# Patient Record
Sex: Female | Born: 1935 | Race: White | Hispanic: No | Smoking: Former smoker
Health system: Southern US, Community
[De-identification: ages and names within clinical notes are randomized; demographics above are authoritative.]

## PROBLEM LIST (undated history)

## (undated) DIAGNOSIS — I10 Essential (primary) hypertension: Secondary | ICD-10-CM

## (undated) DIAGNOSIS — H409 Unspecified glaucoma: Secondary | ICD-10-CM

## (undated) DIAGNOSIS — C539 Malignant neoplasm of cervix uteri, unspecified: Secondary | ICD-10-CM

## (undated) DIAGNOSIS — I2510 Atherosclerotic heart disease of native coronary artery without angina pectoris: Secondary | ICD-10-CM

## (undated) DIAGNOSIS — I779 Disorder of arteries and arterioles, unspecified: Secondary | ICD-10-CM

## (undated) DIAGNOSIS — I6529 Occlusion and stenosis of unspecified carotid artery: Secondary | ICD-10-CM

## (undated) DIAGNOSIS — G20A1 Parkinson's disease without dyskinesia, without mention of fluctuations: Secondary | ICD-10-CM

## (undated) DIAGNOSIS — C679 Malignant neoplasm of bladder, unspecified: Secondary | ICD-10-CM

## (undated) DIAGNOSIS — I739 Peripheral vascular disease, unspecified: Secondary | ICD-10-CM

## (undated) DIAGNOSIS — E785 Hyperlipidemia, unspecified: Secondary | ICD-10-CM

## (undated) DIAGNOSIS — G20 Parkinson's disease: Secondary | ICD-10-CM

## (undated) HISTORY — PX: CHOLECYSTECTOMY: SHX55

## (undated) HISTORY — DX: Disorder of arteries and arterioles, unspecified: I77.9

## (undated) HISTORY — DX: Essential (primary) hypertension: I10

## (undated) HISTORY — DX: Peripheral vascular disease, unspecified: I73.9

## (undated) HISTORY — DX: Hyperlipidemia, unspecified: E78.5

## (undated) HISTORY — PX: TRANSTHORACIC ECHOCARDIOGRAM: SHX275

## (undated) HISTORY — PX: ABDOMINAL HYSTERECTOMY: SHX81

## (undated) HISTORY — PX: BLADDER TUMOR EXCISION: SHX238

## (undated) HISTORY — DX: Occlusion and stenosis of unspecified carotid artery: I65.29

---

## 1997-08-15 ENCOUNTER — Other Ambulatory Visit: Admission: RE | Admit: 1997-08-15 | Discharge: 1997-08-15 | Payer: Self-pay | Admitting: Urology

## 1997-08-27 ENCOUNTER — Ambulatory Visit (HOSPITAL_COMMUNITY): Admission: RE | Admit: 1997-08-27 | Discharge: 1997-08-28 | Payer: Self-pay | Admitting: Urology

## 1997-09-30 ENCOUNTER — Ambulatory Visit (HOSPITAL_COMMUNITY): Admission: RE | Admit: 1997-09-30 | Discharge: 1997-09-30 | Payer: Self-pay | Admitting: Cardiology

## 1997-11-26 ENCOUNTER — Ambulatory Visit (HOSPITAL_COMMUNITY): Admission: RE | Admit: 1997-11-26 | Discharge: 1997-11-26 | Payer: Self-pay | Admitting: Gastroenterology

## 1997-12-19 ENCOUNTER — Other Ambulatory Visit: Admission: RE | Admit: 1997-12-19 | Discharge: 1997-12-19 | Payer: Self-pay | Admitting: *Deleted

## 1999-02-11 ENCOUNTER — Other Ambulatory Visit: Admission: RE | Admit: 1999-02-11 | Discharge: 1999-02-11 | Payer: Self-pay | Admitting: *Deleted

## 1999-02-17 ENCOUNTER — Encounter: Payer: Self-pay | Admitting: *Deleted

## 1999-02-17 ENCOUNTER — Encounter: Admission: RE | Admit: 1999-02-17 | Discharge: 1999-02-17 | Payer: Self-pay | Admitting: *Deleted

## 1999-04-08 ENCOUNTER — Encounter: Admission: RE | Admit: 1999-04-08 | Discharge: 1999-04-08 | Payer: Self-pay | Admitting: *Deleted

## 1999-04-08 ENCOUNTER — Encounter: Payer: Self-pay | Admitting: *Deleted

## 2000-03-04 ENCOUNTER — Encounter: Payer: Self-pay | Admitting: *Deleted

## 2000-03-04 ENCOUNTER — Encounter: Admission: RE | Admit: 2000-03-04 | Discharge: 2000-03-04 | Payer: Self-pay | Admitting: *Deleted

## 2000-03-09 ENCOUNTER — Other Ambulatory Visit: Admission: RE | Admit: 2000-03-09 | Discharge: 2000-03-09 | Payer: Self-pay | Admitting: *Deleted

## 2001-03-23 ENCOUNTER — Encounter: Admission: RE | Admit: 2001-03-23 | Discharge: 2001-03-23 | Payer: Self-pay | Admitting: *Deleted

## 2001-03-23 ENCOUNTER — Encounter: Payer: Self-pay | Admitting: *Deleted

## 2001-04-06 ENCOUNTER — Other Ambulatory Visit: Admission: RE | Admit: 2001-04-06 | Discharge: 2001-04-06 | Payer: Self-pay | Admitting: *Deleted

## 2002-04-04 ENCOUNTER — Encounter: Admission: RE | Admit: 2002-04-04 | Discharge: 2002-04-04 | Payer: Self-pay | Admitting: *Deleted

## 2002-04-04 ENCOUNTER — Encounter: Payer: Self-pay | Admitting: *Deleted

## 2002-06-14 ENCOUNTER — Encounter: Payer: Self-pay | Admitting: Orthopedic Surgery

## 2002-06-14 ENCOUNTER — Ambulatory Visit (HOSPITAL_COMMUNITY): Admission: RE | Admit: 2002-06-14 | Discharge: 2002-06-14 | Payer: Self-pay | Admitting: Orthopedic Surgery

## 2002-08-15 ENCOUNTER — Encounter: Admission: RE | Admit: 2002-08-15 | Discharge: 2002-08-15 | Payer: Self-pay | Admitting: Orthopedic Surgery

## 2002-08-15 ENCOUNTER — Encounter: Payer: Self-pay | Admitting: Orthopedic Surgery

## 2002-08-16 ENCOUNTER — Ambulatory Visit (HOSPITAL_BASED_OUTPATIENT_CLINIC_OR_DEPARTMENT_OTHER): Admission: RE | Admit: 2002-08-16 | Discharge: 2002-08-16 | Payer: Self-pay | Admitting: Orthopedic Surgery

## 2003-05-08 ENCOUNTER — Encounter: Admission: RE | Admit: 2003-05-08 | Discharge: 2003-05-08 | Payer: Self-pay | Admitting: Obstetrics and Gynecology

## 2003-05-23 ENCOUNTER — Other Ambulatory Visit: Admission: RE | Admit: 2003-05-23 | Discharge: 2003-05-23 | Payer: Self-pay | Admitting: Obstetrics and Gynecology

## 2003-06-11 ENCOUNTER — Ambulatory Visit (HOSPITAL_COMMUNITY): Admission: RE | Admit: 2003-06-11 | Discharge: 2003-06-11 | Payer: Self-pay | Admitting: Gastroenterology

## 2003-06-20 ENCOUNTER — Encounter: Admission: RE | Admit: 2003-06-20 | Discharge: 2003-06-20 | Payer: Self-pay | Admitting: Obstetrics and Gynecology

## 2004-06-10 ENCOUNTER — Encounter: Admission: RE | Admit: 2004-06-10 | Discharge: 2004-06-10 | Payer: Self-pay | Admitting: Obstetrics and Gynecology

## 2005-06-14 ENCOUNTER — Encounter: Admission: RE | Admit: 2005-06-14 | Discharge: 2005-06-14 | Payer: Self-pay | Admitting: Obstetrics and Gynecology

## 2005-10-06 HISTORY — PX: NM MYOVIEW LTD: HXRAD82

## 2006-05-18 ENCOUNTER — Encounter: Admission: RE | Admit: 2006-05-18 | Discharge: 2006-05-18 | Payer: Self-pay | Admitting: Internal Medicine

## 2006-07-12 ENCOUNTER — Encounter: Admission: RE | Admit: 2006-07-12 | Discharge: 2006-07-12 | Payer: Self-pay | Admitting: Obstetrics and Gynecology

## 2007-03-08 ENCOUNTER — Encounter: Admission: RE | Admit: 2007-03-08 | Discharge: 2007-03-08 | Payer: Self-pay | Admitting: Orthopaedic Surgery

## 2007-07-26 ENCOUNTER — Encounter: Admission: RE | Admit: 2007-07-26 | Discharge: 2007-07-26 | Payer: Self-pay | Admitting: Internal Medicine

## 2007-08-07 ENCOUNTER — Encounter: Admission: RE | Admit: 2007-08-07 | Discharge: 2007-08-07 | Payer: Self-pay | Admitting: Internal Medicine

## 2008-01-01 ENCOUNTER — Ambulatory Visit (HOSPITAL_COMMUNITY): Admission: RE | Admit: 2008-01-01 | Discharge: 2008-01-03 | Payer: Self-pay | Admitting: Orthopedic Surgery

## 2008-02-16 ENCOUNTER — Encounter: Admission: RE | Admit: 2008-02-16 | Discharge: 2008-02-16 | Payer: Self-pay | Admitting: Internal Medicine

## 2008-11-12 ENCOUNTER — Encounter: Admission: RE | Admit: 2008-11-12 | Discharge: 2008-11-12 | Payer: Self-pay | Admitting: Internal Medicine

## 2009-02-10 DIAGNOSIS — L659 Nonscarring hair loss, unspecified: Secondary | ICD-10-CM | POA: Insufficient documentation

## 2009-02-27 DIAGNOSIS — D649 Anemia, unspecified: Secondary | ICD-10-CM | POA: Insufficient documentation

## 2009-09-05 HISTORY — PX: OTHER SURGICAL HISTORY: SHX169

## 2009-09-30 DIAGNOSIS — M899 Disorder of bone, unspecified: Secondary | ICD-10-CM | POA: Insufficient documentation

## 2009-11-17 ENCOUNTER — Inpatient Hospital Stay (HOSPITAL_COMMUNITY): Admission: AD | Admit: 2009-11-17 | Discharge: 2009-11-18 | Payer: Self-pay | Admitting: Internal Medicine

## 2009-11-17 DIAGNOSIS — R300 Dysuria: Secondary | ICD-10-CM | POA: Insufficient documentation

## 2010-01-22 DIAGNOSIS — J00 Acute nasopharyngitis [common cold]: Secondary | ICD-10-CM | POA: Insufficient documentation

## 2010-02-05 ENCOUNTER — Encounter: Admission: RE | Admit: 2010-02-05 | Discharge: 2010-02-05 | Payer: Self-pay | Admitting: Internal Medicine

## 2010-03-29 ENCOUNTER — Encounter (HOSPITAL_BASED_OUTPATIENT_CLINIC_OR_DEPARTMENT_OTHER): Payer: Self-pay | Admitting: Internal Medicine

## 2010-03-29 ENCOUNTER — Encounter: Payer: Self-pay | Admitting: Vascular Surgery

## 2010-05-21 LAB — CBC
HCT: 33.7 % — ABNORMAL LOW (ref 36.0–46.0)
HCT: 36.5 % (ref 36.0–46.0)
Hemoglobin: 11.3 g/dL — ABNORMAL LOW (ref 12.0–15.0)
Hemoglobin: 12.4 g/dL (ref 12.0–15.0)
MCH: 28.1 pg (ref 26.0–34.0)
MCH: 28.5 pg (ref 26.0–34.0)
MCHC: 34.1 g/dL (ref 30.0–36.0)
MCV: 84.2 fL (ref 78.0–100.0)
Platelets: 299 10*3/uL (ref 150–400)
RBC: 4.01 MIL/uL (ref 3.87–5.11)
RDW: 15.2 % (ref 11.5–15.5)
WBC: 11.6 10*3/uL — ABNORMAL HIGH (ref 4.0–10.5)

## 2010-05-21 LAB — URINALYSIS, ROUTINE W REFLEX MICROSCOPIC
Bilirubin Urine: NEGATIVE
Glucose, UA: NEGATIVE mg/dL
Ketones, ur: NEGATIVE mg/dL
Leukocytes, UA: NEGATIVE
Nitrite: NEGATIVE
Specific Gravity, Urine: 1.013 (ref 1.005–1.030)
pH: 6 (ref 5.0–8.0)

## 2010-05-21 LAB — COMPREHENSIVE METABOLIC PANEL
BUN: 8 mg/dL (ref 6–23)
CO2: 27 mEq/L (ref 19–32)
Calcium: 9.6 mg/dL (ref 8.4–10.5)
Creatinine, Ser: 0.73 mg/dL (ref 0.4–1.2)
GFR calc non Af Amer: >60 mL/min (ref >60)
Glucose, Bld: 155 mg/dL — ABNORMAL HIGH (ref 70–99)
Sodium: 135 mEq/L (ref 135–145)
Total Protein: 7.1 g/dL (ref 6.0–8.3)

## 2010-05-21 LAB — BASIC METABOLIC PANEL
CO2: 26 mEq/L (ref 19–32)
Chloride: 105 mEq/L (ref 96–112)
Creatinine, Ser: 0.8 mg/dL (ref 0.4–1.2)
GFR calc Af Amer: >60 mL/min (ref >60)
Potassium: 3.3 mEq/L — ABNORMAL LOW (ref 3.5–5.1)
Sodium: 137 mEq/L (ref 135–145)

## 2010-05-21 LAB — URINE CULTURE
Culture  Setup Time: 201109122104
Special Requests: NEGATIVE

## 2010-07-21 NOTE — Op Note (Signed)
NAMESOMYA, Stacey Walker                 ACCOUNT NO.:  [id number]   MEDICAL RECORD NO.:  [id number]          PATIENT TYPE:  AMB   LOCATION:  DAY                          FACILITY:  [facility]   PHYSICIAN:  Ollen Gross, M.D.    DATE OF BIRTH:  11-Dec-1925   DATE OF PROCEDURE:  01/01/2008  DATE OF DISCHARGE:                               OPERATIVE REPORT   PREOPERATIVE DIAGNOSIS:  Intractable right hip bursitis with gluteal  tendon tear.   POSTOPERATIVE DIAGNOSIS:  Intractable right hip bursitis with gluteal  tendon tear.   PROCEDURE:  Right hip bursectomy, iliotibial band resection and gluteal  tendon repair.   SURGEON:  Ollen Gross, M.D.   ASSISTANT:  Avel Peace, P.A.-C.   ANESTHESIA:  General.   ESTIMATED BLOOD LOSS:  Minimal.   DRAINS:  None.   COMPLICATIONS:  None.   CONDITION:  Stable to recovery.   BRIEF CLINICAL NOTE:  Stacey Walker is an 77 year old female who has had an  over 1 year history of significant lateral hip pain on the right with  progressive dysfunction.  She has had injections on greater than  occasion which have provided very short term partial relief.  She had  physical therapy without much benefit.  Unfortunately, the pain is  become intractable and she presents now the above-mentioned procedure.   PROCEDURE IN DETAIL:  After successful administration of general  anesthetic, the patient is placed in left lateral decubitus position  with the right side up and held with the hip positioner.  Right lower  extremity was isolated from the perineum with plastic drapes and prepped  and draped in usual sterile fashion.  Short lateral incision was made  directly over the tip of the greater trochanter coursing from proximal  to distal about 4 inches altogether.  The skin was cut through the  subcutaneous tissue to the level of fascia lata which was incised in  line with the skin incision.  There was a very large thickened bursa  present and rotated the hip  internally and externally to get the full  extent of the bursa removed.  Inspected the gluteal tendons.  There was  quite a bit of attrition of the gluteus medius, and it is paper thin at  its insertion on the tip of the trochanter consistent with a partial  tear.  In fact, I dissected through this paper thin layer, and there was  a big defect present on the tip of the trochanter.  I roughened up the  edge of bone and placed 2 Mitek anchors into the greater trochanter.  We  then passed the needles through the gluteal tendon and advanced it stick  down onto the bone and tied these and oversewed to get a good stable  repair.  Once this was completed, I inspected the rest of the tendon,  and it was fine.  I examined the short external rotators, and her  piriformis was extremely tight.  I did release the piriformis tendon as  it potentially was playing a roll in her posterolateral pain.  I then  thoroughly irrigated  the wound with saline.  I checked again, and no  other bleeding points were noted.  I removed a triangular piece of the  fascia lata so it would not rub over the tip of the trochanter.  We  closed the rest of the fascia lata above and below the triangle with  interrupted #1 Vicryl.  Subcutaneous tissue was closed with #1-0 and #2-  0 Vicryl and subcuticular running 4-0 Monocryl.  Thirty mL of 0.25%  Marcaine with epinephrine injected into the subcutaneous tissues.  Steri-  Strips and a bulky sterile dressing was then applied, and she was  awakened and transferred to recovery in stable condition.      Ollen Gross, M.D.  Electronically Signed     FA/MEDQ  D:  01/01/2008  T:  01/01/2008  Job:  [id number]

## 2010-07-24 NOTE — Op Note (Signed)
NAME:  Stacey Walker, Stacey Walker                           ACCOUNT NO.:  [id number]   MEDICAL RECORD NO.:  [id number]                   PATIENT TYPE:  AMB   LOCATION:  ENDO                                 FACILITY:  [facility]   PHYSICIAN:  Petra Kuba, M.D.                 DATE OF BIRTH:  12-Mar-1925   DATE OF PROCEDURE:  06/11/2003  DATE OF DISCHARGE:                                 OPERATIVE REPORT   PROCEDURE:  Colonoscopy.   INDICATIONS:  The patient is history of colon polyps.  Due for repeat  screening.  Consent was signed after risks, benefits, methods, and options  were thoroughly discussed in the office multiple times in the past.   PREMEDICATIONS:  Demerol 80 mg, Versed 7.5 mg.   DESCRIPTION OF PROCEDURE:  Rectal inspection pertinent for external  hemorrhoids, small.  Digital exam was negative.  Video pediatric adjustable  colonoscope was inserted and with abdominal pressure able to be advanced  around the colon to the cecum.  This did not require any position changes.  No obvious abnormality was seen on insertion.  The scope was slowly  withdrawn.  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  Photo documentation was obtained  although the picture did not come out very well since it was too dark.  After lots of washing and suctioning, adequate visualization was obtained  and on slow withdrawal through the colon, no additional findings were seen;  specifically, no polyps, tumors, masses, or diverticula.  Anorectal pull-  through and retroflexion confirmed some small hemorrhoids.  The scope was  reinserted a short ways up the left side of the colon.  Air was suctioned  and the scope removed.  The patient tolerated the procedure well.  There was  no obvious immediate complication.   ENDOSCOPIC DIAGNOSES:  1. Internal and external hemorrhoids.  2. Cecal arteriovenous malformations, full.  3. Otherwise within normal limits to the cecum.   PLAN:  Yearly rectals and guaiacs per Dr. Eloise Harman.  Happy to see  back  p.r.n.  Consider repeat screening in 5-10 years if doing well medically.                                               Petra Kuba, M.D.    MEM/MEDQ  D:  06/11/2003  T:  06/11/2003  Job:  [id number]   cc:   Barry Dienes. Eloise Harman, M.D.  [address]  [place]  [place] [address]  Fax: [phone]   Madaline Savage, M.D.  [phone] [address], [address]  [place]  [place] [address]  Fax: [phone]

## 2010-07-24 NOTE — Op Note (Signed)
NAME:  Stacey Walker, Stacey Walker                           ACCOUNT NO.:  [id number]   MEDICAL RECORD NO.:  [id number]                   PATIENT TYPE:  AMB   LOCATION:  DSC                                  FACILITY:  [facility]   PHYSICIAN:  Katy Fitch. Naaman Plummer., M.D.          DATE OF BIRTH:  [birth year]   DATE OF PROCEDURE:  08/16/2002  DATE OF DISCHARGE:                                 OPERATIVE REPORT   PREOPERATIVE DIAGNOSES:  1. 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  2. Rule out possible full-thickness anterior supraspinatus rotator cuff     tear.   POSTOPERATIVE DIAGNOSES:  1. 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  2. Rule out possible full-thickness anterior supraspinatus rotator cuff     tear.  3. Identification of significant bursal hypertrophy and partial-thickness     bursal side rotator cuff degenerative tear due to chronic impingement.     No sign of full-thickness rotated rotator cuff tear.   PROCEDURES:  1. Diagnostic arthroscopy, left glenohumeral joint.  2. Arthroscopic subacromial decompression with bursectomy, coracoacromial     ligament release, and acromioplasty.  3. Arthroscopic distal clavicle resection.   SURGEON:  Katy Fitch. Sypher, M.D.   ASSISTANT:  Jonni Sanger, P.A.   ANESTHESIA:  General endotracheal supplemented by interscalene block  supervised by the anesthesiologist, Stacey Walker. Stacey Walker, M.D.   INDICATIONS:  Stacey Walker is a 77 year old woman who has had a history of  previous right-side subacromial decompression and rotator cuff repair.  She  did quite well after surgery in 1995 on the right.  She has progressed to an  impingement syndrome on the left and is having difficulty with elevation of  her shoulder, external rotation, weakness of abduction, and night-time  symptoms.  Clinical examination and plain films suggested AC arthropathy and  an anterior impingement syndrome evidenced by type 3 acromion and some  sclerotic changes of the greater tuberosity.   An MRI was obtained, which demonstrated moderate tendinopathy of the  supraspinatus tendon and a partial anterior full-thickness tear as well as  bursal hypertrophy.   We recommended diagnostic arthroscopy followed by appropriate intervention,  anticipating subacromial decompression, distal clavicle resection, and  possible repair of the rotator cuff.   PROCEDURE:  Stacey Walker was brought to the operating room and placed in  supine position on the operating table.  Following the induction of general  endotracheal anesthesia, she was carefully positioned in the beach chair  position in the aid of a torso and head holder designed for shoulder  arthroscopy.   The entire right upper extremity and forequarter were prepped with Duraprep  and draped with impervious arthroscopy drapes.   The scope was introduced through a standard posterior portal with blunt  technique.  A  diagnostic arthroscopy revealed intact hyaline articular  cartilage surfaces on the humeral head and glenoid.  The labrum was intact  360 degrees.  The biceps anchor was stable.  The rotator interval was normal  in appearance.  The subscapularis was normal.  The anterior glenohumeral  ligaments were normal.  The inferior recess was normal.  The deep surface of  the supraspinatus, infraspinatus, and teres minor were easily visualized,  found to be intact.  There was a considerable amount of fibrovascular tissue  consistent with early adhesive capsulitis noted in the superior recess and  the deep surface of the infraspinatus and teres minor.   The scope was removed from the glenohumeral joint and placed in the  subacromial space.  There was noted to be a moderate degree of bursitis.  The anterior acromial spur morphology was  easily identified after release of  the coracoacromial ligament.  The AC capsule was quite prominent.  The  capsule was taken down with the bipolar cautery, followed by use of the  suction shaver to remove deep surface bursa on the acromion.  The acromion  was leveled to a type 1 morphology with the bur brought in both posteriorly  and laterally with contralateral visualization.   The [facility] joint was inspected and found to be moderately degenerative.  The  distal 18 mm of clavicle was removed off the scapula without difficulty.   Hemostasis was achieved with bipolar cautery.   After a thorough bursectomy, inspection of the rotator cuff revealed a  partial-thickness tear at the anterior supraspinatus adjacent to the greater  tuberosity about 5 mm posterior to the rotator interval.  This was not full-  thickness and not retracted.   The margins of this were smoothed with a suction shaver, followed by careful  debridement of the bursa.   Hemostasis was achieved with the bipolar cautery device, followed by removal  of the arthroscopic equipment.   The portals were sutured and dressed with Xeroflo, sterile gauze, and a  Hypafix dressing.   She was awakened from anesthesia and transferred to the recovery room with  stable vital signs.   She will be discharged home with prescriptions for Dilaudid 2 mg one or two  tablets p.o. q.4-6h. p.r.n. pain, 30 tablets without refill, also Motrin 600  mg one p.o. q.6h. p.r.n. pain, 30 tablets with two refills, and Keflex 500  mg one p.o. q.8h. x4 days as a prophylactic antibiotic.                                                Katy Fitch Naaman Plummer., M.D.    RVS/MEDQ  D:  08/16/2002  T:  08/17/2002  Job:  [id number]

## 2010-12-08 LAB — URINE MICROSCOPIC-ADD ON

## 2010-12-08 LAB — COMPREHENSIVE METABOLIC PANEL
ALT: 13
AST: 20
Albumin: 3.6
CO2: 29
Calcium: 10.9 — ABNORMAL HIGH
Chloride: 104
GFR calc Af Amer: >60
GFR calc non Af Amer: >60
Sodium: 138

## 2010-12-08 LAB — CBC
RBC: 4.62
WBC: 8

## 2010-12-08 LAB — TYPE AND SCREEN: ABO/RH(D): B NEG

## 2010-12-08 LAB — URINALYSIS, ROUTINE W REFLEX MICROSCOPIC
Glucose, UA: NEGATIVE
Specific Gravity, Urine: 1.018
Urobilinogen, UA: 1
pH: 7

## 2010-12-08 LAB — PROTIME-INR: INR: 1.1

## 2011-04-05 DIAGNOSIS — R200 Anesthesia of skin: Secondary | ICD-10-CM | POA: Insufficient documentation

## 2011-10-13 ENCOUNTER — Other Ambulatory Visit: Payer: Self-pay | Admitting: Internal Medicine

## 2011-10-13 DIAGNOSIS — Z1231 Encounter for screening mammogram for malignant neoplasm of breast: Secondary | ICD-10-CM

## 2011-10-18 ENCOUNTER — Ambulatory Visit
Admission: RE | Admit: 2011-10-18 | Discharge: 2011-10-18 | Disposition: A | Discharge status: Home / Self Care | Payer: Medicare Other | Source: Ambulatory Visit | Attending: Internal Medicine | Admitting: Internal Medicine

## 2011-10-18 DIAGNOSIS — Z1231 Encounter for screening mammogram for malignant neoplasm of breast: Secondary | ICD-10-CM

## 2011-10-20 ENCOUNTER — Ambulatory Visit: Payer: Self-pay

## 2011-12-09 DIAGNOSIS — R059 Cough, unspecified: Secondary | ICD-10-CM | POA: Insufficient documentation

## 2012-04-04 DIAGNOSIS — R030 Elevated blood-pressure reading, without diagnosis of hypertension: Secondary | ICD-10-CM | POA: Insufficient documentation

## 2012-07-26 ENCOUNTER — Telehealth: Payer: Self-pay | Admitting: Cardiology

## 2012-07-26 NOTE — Telephone Encounter (Signed)
Need some samples Vytorin 10/40 please!

## 2012-07-28 NOTE — Telephone Encounter (Signed)
Pt walked in and was given samples.

## 2012-08-24 ENCOUNTER — Telehealth: Payer: Self-pay | Admitting: Cardiology

## 2012-08-24 NOTE — Telephone Encounter (Signed)
Would like some samples of Vyotorin 10/40 please!

## 2012-08-24 NOTE — Telephone Encounter (Signed)
Samples left at front desk for pt pick up.  Lot: [id number] Exp: 08/2013.  Call pt and informed.

## 2012-09-04 DIAGNOSIS — R1310 Dysphagia, unspecified: Secondary | ICD-10-CM | POA: Insufficient documentation

## 2012-09-21 ENCOUNTER — Telehealth: Payer: Self-pay | Admitting: Cardiology

## 2012-09-21 MED ORDER — EZETIMIBE-SIMVASTATIN 10-40 MG PO TABS: 1.0000 | ORAL_TABLET | Freq: Every day | ORAL | Status: DC

## 2012-09-21 NOTE — Telephone Encounter (Signed)
Samples left at front desk for pt pick up.  Lot: [id number] Exp: 08/2013.  Returned call.  Left message samples at front and to call back before 4pm if questions.

## 2012-09-21 NOTE — Telephone Encounter (Signed)
NEED SAMPLES OF VYOTORIN 10-40 PLEASE!

## 2012-10-17 ENCOUNTER — Other Ambulatory Visit: Payer: Self-pay

## 2012-10-17 DIAGNOSIS — Z1231 Encounter for screening mammogram for malignant neoplasm of breast: Secondary | ICD-10-CM

## 2012-10-19 ENCOUNTER — Telehealth: Payer: Self-pay | Admitting: Cardiology

## 2012-10-19 MED ORDER — EZETIMIBE-SIMVASTATIN 10-40 MG PO TABS: 1.0000 | ORAL_TABLET | Freq: Every day | ORAL | Status: DC

## 2012-10-19 NOTE — Telephone Encounter (Signed)
Samples left at front desk for pt pick up.  Lot: [id number] Exp: 08/2013.  Returned call and pt informed samples left at front desk.  Pt verbalized understanding and agreed w/ plan.

## 2012-10-19 NOTE — Telephone Encounter (Signed)
Would like some samples of Vytorin 140 please.

## 2012-11-07 ENCOUNTER — Ambulatory Visit
Admission: RE | Admit: 2012-11-07 | Discharge: 2012-11-07 | Disposition: A | Discharge status: Home / Self Care | Payer: Medicare PPO | Source: Ambulatory Visit

## 2012-11-07 DIAGNOSIS — Z1231 Encounter for screening mammogram for malignant neoplasm of breast: Secondary | ICD-10-CM

## 2012-11-20 ENCOUNTER — Telehealth: Payer: Self-pay | Admitting: Cardiology

## 2012-11-20 MED ORDER — EZETIMIBE-SIMVASTATIN 10-40 MG PO TABS: 1.0000 | ORAL_TABLET | Freq: Every day | ORAL | Status: DC

## 2012-11-20 NOTE — Telephone Encounter (Signed)
Pt was calling regarding getting samples of Vytorin 10-40.

## 2012-11-20 NOTE — Telephone Encounter (Signed)
Returned call and left message samples left at front desk.  

## 2012-11-21 ENCOUNTER — Telehealth: Payer: Self-pay | Admitting: Cardiovascular Disease

## 2012-11-21 NOTE — Telephone Encounter (Signed)
She received a letter from Dr Royann Shivers and she want him to call her about it.Says she wants to talk to him and not a nurse.

## 2012-11-21 NOTE — Telephone Encounter (Signed)
Returned call.  Pt stated she received a bill from [facility] for a bill for $20 and her visit was in January 2014.  Stated she received the bill at the end of August.  Pt upset that it took almost 9 months to send her a bill.  Pt stated she did pay the bill, but she is not happy about the transition to [facility] and the time it took to get a bill for $20.  RN apologized for the length of time it took to generate her bill.  Pt informed that there may be a delay in some of the processes our office had prior to joining [facility] b/c some of the services are handled by the hospital now. Pt verbalized understanding and much calmer after RN informed her complaint will be forwarded to our office manager.  Message printed and left for [employer], Print production planner.

## 2012-12-19 ENCOUNTER — Telehealth: Payer: Self-pay | Admitting: Cardiology

## 2012-12-19 MED ORDER — EZETIMIBE-SIMVASTATIN 10-40 MG PO TABS: 1.0000 | ORAL_TABLET | Freq: Every day | ORAL | Status: DC

## 2012-12-19 NOTE — Telephone Encounter (Signed)
Would like samples of Vytorin10-40 mg please.

## 2012-12-19 NOTE — Telephone Encounter (Signed)
Returned call and pt informed samples left at front desk.  Pt verbalized understanding and agreed w/ plan.    

## 2013-01-16 ENCOUNTER — Telehealth: Payer: Self-pay | Admitting: Cardiology

## 2013-01-16 MED ORDER — EZETIMIBE-SIMVASTATIN 10-40 MG PO TABS: 1.0000 | ORAL_TABLET | Freq: Every day | ORAL | Status: DC

## 2013-01-16 NOTE — Telephone Encounter (Signed)
Returned call and pt informed samples left at front desk.  Pt verbalized understanding and agreed w/ plan.    

## 2013-01-16 NOTE — Telephone Encounter (Signed)
Wants to get samples of Vytorin 10-40 mg  Please call °

## 2013-02-08 DIAGNOSIS — R0989 Other specified symptoms and signs involving the circulatory and respiratory systems: Secondary | ICD-10-CM | POA: Insufficient documentation

## 2013-02-08 DIAGNOSIS — J310 Chronic rhinitis: Secondary | ICD-10-CM | POA: Insufficient documentation

## 2013-02-12 ENCOUNTER — Telehealth: Payer: Self-pay | Admitting: Cardiology

## 2013-02-12 NOTE — Telephone Encounter (Signed)
Wants to get samples of Vytorin 10-40 mg  Please call

## 2013-02-13 MED ORDER — EZETIMIBE-SIMVASTATIN 10-40 MG PO TABS: 1.0000 | ORAL_TABLET | Freq: Every day | ORAL | Status: DC

## 2013-02-13 NOTE — Telephone Encounter (Signed)
Samples left for patient at front desk - called patient & left VM with this information.

## 2013-02-13 NOTE — Telephone Encounter (Signed)
Never heard anything from anyone yesterday  Please call as soon as possible as she has to catch a a bus to get here.

## 2013-03-20 ENCOUNTER — Encounter: Payer: Self-pay | Admitting: Cardiology

## 2013-03-20 ENCOUNTER — Ambulatory Visit (INDEPENDENT_AMBULATORY_CARE_PROVIDER_SITE_OTHER): Payer: Medicare PPO | Admitting: Cardiology

## 2013-03-20 VITALS — BP 110/70 | HR 56 | Ht 62.5 in | Wt 155.3 lb

## 2013-03-20 DIAGNOSIS — I6521 Occlusion and stenosis of right carotid artery: Secondary | ICD-10-CM

## 2013-03-20 DIAGNOSIS — E785 Hyperlipidemia, unspecified: Secondary | ICD-10-CM

## 2013-03-20 DIAGNOSIS — I10 Essential (primary) hypertension: Secondary | ICD-10-CM

## 2013-03-20 DIAGNOSIS — I779 Disorder of arteries and arterioles, unspecified: Secondary | ICD-10-CM

## 2013-03-20 DIAGNOSIS — I6529 Occlusion and stenosis of unspecified carotid artery: Secondary | ICD-10-CM

## 2013-03-20 DIAGNOSIS — I739 Peripheral vascular disease, unspecified: Secondary | ICD-10-CM

## 2013-03-20 MED ORDER — EZETIMIBE-SIMVASTATIN 10-40 MG PO TABS: 1.0000 | ORAL_TABLET | Freq: Every day | ORAL | Status: DC

## 2013-03-20 NOTE — Patient Instructions (Signed)
Things seem pretty stable. Blood Pressure & Heart Rate. We are just going to recheck the carotid ultrasound to make sure things are stable.  Otherwise, I will see you back in 1 year.    Leonie Man, MD  Your physician wants you to follow-up in: 12 months. You will receive a reminder letter in the mail two months in advance. If you don't receive a letter, please call our office to schedule the follow-up appointment.

## 2013-03-25 ENCOUNTER — Encounter: Payer: Self-pay | Admitting: Cardiology

## 2013-03-25 DIAGNOSIS — I10 Essential (primary) hypertension: Secondary | ICD-10-CM | POA: Insufficient documentation

## 2013-03-25 DIAGNOSIS — I779 Disorder of arteries and arterioles, unspecified: Secondary | ICD-10-CM | POA: Insufficient documentation

## 2013-03-25 DIAGNOSIS — E785 Hyperlipidemia, unspecified: Secondary | ICD-10-CM | POA: Insufficient documentation

## 2013-03-25 DIAGNOSIS — I739 Peripheral vascular disease, unspecified: Secondary | ICD-10-CM

## 2013-03-25 NOTE — Assessment & Plan Note (Signed)
On Vytorin plus niacin and Krill Oil. Last labs were from December showed total cholesterol 148, TG 118, HDL 47 LDL 70. Normal LFTs. Her A1c was excellent at 6.0x6.5.

## 2013-03-25 NOTE — Progress Notes (Signed)
PATIENT: Stacey Walker MRN: [id number]  DOB: [birth year]   DOV:03/25/2013 PCP: Donnajean Lopes, MD  Clinic Note: Chief Complaint  Patient presents with  . Annual Exam    C/o occas shortness of breath, lft leg pain-wakes her up at night, edema, and carotids-Dr Philip Aspen said "carotids sounded swishy."    HPI: Stacey Walker is a 80 y.o.  female with a PMH below who presents today for annual followup. She is a very pleasant elderly woman who has been followed for her cardiac risk factors hypertension dyslipidemia as well as moderate carotid disease.  Interval History: She continues to be relatively asystematic overall from a cardiac standpoint. She is mostly limited by strength in her legs mostly in the quads. She says that she'll feel occasionally short of breath with exertion but only she is pushing more than she usually would want to. She's had chronic swelling of the left leg for last 3-4 months. This causes little aching and discomfort. Otherwise no claudication symptoms. No chest tightness or pressure fast exertion. No resting dyspnea or with activity. No PND, orthopnea. No TIA or RCA symptoms. No rapid or irregular heartbeats. No syncope or near syncope symptoms. No melena, hematochezia hematuria. No claudication. She was referred back by her PCP due to concern for possible carotid stenosis. From our last visit, she indicated that she was managed and continue to follow her carotid disease.she would not be interested in invasive procedure or CEA.   Past Medical History  Diagnosis Date  . Hypertension, essential, benign   . Dyslipidemia     Managed by PCP. On statin  . Moderate Right-sided carotid artery disease     50-70% stenosis on the right carotid artery from 2011. Also noted less than 50% right and left subclavian artery stenosis.    Prior Cardiac Evaluation and Past Surgical History: Past Surgical History  Procedure Laterality Date  . Transthoracic echocardiogram  August 7   Normal EF, impaired relaxation with mildly sclerotic aortic valve but no stenosis. Mild left atrial dilation.  Marland Kitchen Nm myoview ltd  August 2007    No ischemia or infarction  . Carotid dopplers  July 2011    50-70% stenosis of the right carotid with <50% stenosis of both the right and left subclavian arteries.    No Known Allergies  Current Outpatient Prescriptions  Medication Sig Dispense Refill  . aspirin EC 81 MG tablet Take 81 mg by mouth daily.      . clobetasol (OLUX) 0.05 % topical foam Apply 1 application topically as needed.      . ezetimibe-simvastatin (VYTORIN) 10-40 MG per tablet Take 1 tablet by mouth at bedtime.  28 tablet  0  . ipratropium (ATROVENT) 0.03 % nasal spray Place 1 spray into both nostrils as needed.      Marland Kitchen LUMIGAN 0.01 % SOLN Place 1 drop into both eyes at bedtime.      . Multiple Vitamin (MULTIVITAMIN) tablet Take 1 tablet by mouth daily.      . niacin (SLO-NIACIN) 500 MG tablet Take 500 mg by mouth daily.      . Omega-3 Krill Oil 300 MG CAPS Take 300 mg by mouth daily.      . timolol (BETIMOL) 0.5 % ophthalmic solution 1 drop 2 (two) times daily.      . valsartan-hydrochlorothiazide (DIOVAN-HCT) 320-12.5 MG per tablet Take 1 tablet by mouth daily.       No current facility-administered medications for this visit.    History  Social History Narrative   Widowed mother of one, grandmother of one. She quit smoking in 1980. She has a rare alcohol beverage from time to time. She currently lives at [place] on [address].   She is able to get around there without to much difficulty.   During previous visit, she voiced the desire for DO NOT RESUSCITATE and DO NOT INTUBATE. She also is with the desire to avoid any invasive procedures.     ROS: A comprehensive Review of Systems - Negative except Weakness and strength of her quadriceps that make it difficult for her to sit up or stand up from sitting. Otherwise negative; she denies any sudden onset  weakness or numbness on one side versus the other. No vision field loss.   PHYSICAL EXAM BP 110/70  Pulse 56  Ht 5' 2.5" (1.588 m)  Wt 155 lb 4.8 oz (70.444 kg)  BMI 27.93 kg/m2 General appearance: alert, cooperative, appears stated age, no distress and Pleasant mood and affect. Well-nourished and well-groomed. Normal mood and affect. Very vibrant and energetic Neck: no adenopathy, no carotid bruit, no JVD, supple, symmetrical, trachea midline and thyroid not enlarged, symmetric, no tenderness/mass/nodules Lungs: clear to auscultation bilaterally, normal percussion bilaterally and Nonlabored, good air movement. No W./R./R. Heart: normal apical impulse, regular rate and rhythm, S1, S2 normal and 2/6 SEM (crescendo-dictation note) at RUSB. Nondisplaced PMI. No R./G. Abdomen: soft, non-tender; bowel sounds normal; no masses,  no organomegaly Extremities: extremities normal, atraumatic, no cyanosis or edema Pulses: 2+ and symmetric Neurologic: Grossly normal  DVV:OHYWVPXTG today: Yes Rate: 56 , Rhythm: Sinus bradycardia, otherwise normal;  Recent Labs: None available  ASSESSMENT / PLAN: , Relatively stable her we will with no active cardiac findings but notable moderate stenosis in the right carotid artery. No active cerebrovascular symptoms. Moderate Right-sided carotid artery disease For several years, we did not check carotid Dopplers because this was a screening evaluation for potential procedure that she was not interested in my conversation with her. This was also the conversation was had with her former cardiologist. However at the request of her primary physician will be rechecked carotid Dopplers along with subclavian artery Dopplers to ensure there is no significant increase in the extent of stenoses. For cardiovascular risk reduction she is on a statin and ARB. For protection she is on aspirin. She is also on adjunctive lipid therapy  Hypertension, essential,  benign Well-controlled on ARB/HCTZ combination.  Dyslipidemia On Vytorin plus niacin and Krill Oil. Last labs were from December showed total cholesterol 148, TG 118, HDL 47 LDL 70. Normal LFTs. Her A1c was excellent at 6.0x6.5.   Orders Placed This Encounter  Procedures  . EKG 12-Lead  . Doppler carotid    Standing Status: Future     Number of Occurrences:      Standing Expiration Date: 03/20/2014    Order Specific Question:  Laterality    Answer:  Bilateral    Order Specific Question:  Where should this test be performed:    Answer:  [facility]    Followup: 1 year  DAVID W. Ellyn Hack, M.D., M.S. [facility] [address] [place], [place]  [address]  [phone] Pager # [phone]

## 2013-03-25 NOTE — Assessment & Plan Note (Signed)
Well-controlled on ARB/HCTZ combination.

## 2013-03-25 NOTE — Assessment & Plan Note (Addendum)
For several years, we did not check carotid Dopplers because this was a screening evaluation for potential procedure that she was not interested in my conversation with her. This was also the conversation was had with her former cardiologist. However at the request of her primary physician will be rechecked carotid Dopplers along with subclavian artery Dopplers to ensure there is no significant increase in the extent of stenoses. For cardiovascular risk reduction she is on a statin and ARB. For protection she is on aspirin. She is also on adjunctive lipid therapy

## 2013-04-01 ENCOUNTER — Emergency Department (HOSPITAL_BASED_OUTPATIENT_CLINIC_OR_DEPARTMENT_OTHER)
Admission: EM | Admit: 2013-04-01 | Discharge: 2013-04-01 | Disposition: A | Discharge status: Home / Self Care | Payer: Medicare PPO | Attending: Emergency Medicine | Admitting: Emergency Medicine

## 2013-04-01 ENCOUNTER — Emergency Department (HOSPITAL_BASED_OUTPATIENT_CLINIC_OR_DEPARTMENT_OTHER): Payer: Medicare PPO

## 2013-04-01 ENCOUNTER — Encounter (HOSPITAL_BASED_OUTPATIENT_CLINIC_OR_DEPARTMENT_OTHER): Payer: Self-pay | Admitting: Emergency Medicine

## 2013-04-01 DIAGNOSIS — I10 Essential (primary) hypertension: Secondary | ICD-10-CM | POA: Insufficient documentation

## 2013-04-01 DIAGNOSIS — W010XXA Fall on same level from slipping, tripping and stumbling without subsequent striking against object, initial encounter: Secondary | ICD-10-CM | POA: Insufficient documentation

## 2013-04-01 DIAGNOSIS — Y929 Unspecified place or not applicable: Secondary | ICD-10-CM | POA: Insufficient documentation

## 2013-04-01 DIAGNOSIS — S0990XA Unspecified injury of head, initial encounter: Secondary | ICD-10-CM | POA: Insufficient documentation

## 2013-04-01 DIAGNOSIS — Z87891 Personal history of nicotine dependence: Secondary | ICD-10-CM | POA: Insufficient documentation

## 2013-04-01 DIAGNOSIS — Y939 Activity, unspecified: Secondary | ICD-10-CM | POA: Insufficient documentation

## 2013-04-01 DIAGNOSIS — S01501A Unspecified open wound of lip, initial encounter: Secondary | ICD-10-CM | POA: Insufficient documentation

## 2013-04-01 DIAGNOSIS — Z79899 Other long term (current) drug therapy: Secondary | ICD-10-CM | POA: Insufficient documentation

## 2013-04-01 DIAGNOSIS — E785 Hyperlipidemia, unspecified: Secondary | ICD-10-CM | POA: Insufficient documentation

## 2013-04-01 DIAGNOSIS — Z7982 Long term (current) use of aspirin: Secondary | ICD-10-CM | POA: Insufficient documentation

## 2013-04-01 DIAGNOSIS — S01511A Laceration without foreign body of lip, initial encounter: Secondary | ICD-10-CM

## 2013-04-01 NOTE — ED Provider Notes (Signed)
CSN: [id number]     Arrival date & time 04/01/13  1001 History   First MD Initiated Contact with Patient 04/01/13 1003     Chief Complaint  Patient presents with  . Fall   (Consider location/radiation/quality/duration/timing/severity/associated sxs/prior Treatment) Patient is a 80 y.o. female presenting with fall.  Fall   Pt reports she stubbed her toe and fell forward this AM, hitting her forehead and lip. She denies any LOC, no pain in toe, wrists or head at this time. Sustained a laceration to upper lip. Takes ASA but no other blood thinners.   Past Medical History  Diagnosis Date  . Hypertension, essential, benign   . Dyslipidemia     Managed by PCP. On statin  . Moderate Right-sided carotid artery disease     50-70% stenosis on the right carotid artery from 2011. Also noted less than 50% right and left subclavian artery stenosis.   Past Surgical History  Procedure Laterality Date  . Transthoracic echocardiogram  August 7    Normal EF, impaired relaxation with mildly sclerotic aortic valve but no stenosis. Mild left atrial dilation.  Marland Kitchen Nm myoview ltd  August 2007    No ischemia or infarction  . Carotid dopplers  July 2011    50-70% stenosis of the right carotid with <50% stenosis of both the right and left subclavian arteries.   No family history on file. History  Substance Use Topics  . Smoking status: Former Smoker    Quit date: 03/08/1978  . Smokeless tobacco: Never Used  . Alcohol Use: Yes     Comment: occas   OB History   Grav Para Term Preterm Abortions TAB SAB Ect Mult Living                 Review of Systems All other systems reviewed and are negative except as noted in HPI.   Allergies  Review of patient's allergies indicates no known allergies.  Home Medications   Current Outpatient Rx  Name  Route  Sig  Dispense  Refill  . aspirin EC 81 MG tablet   Oral   Take 81 mg by mouth daily.         . clobetasol (OLUX) 0.05 % topical foam   Topical   Apply 1 application topically as needed.         . ezetimibe-simvastatin (VYTORIN) 10-40 MG per tablet   Oral   Take 1 tablet by mouth at bedtime.   28 tablet   0     Lot:  Exp: 08/2013.   Marland Kitchen ipratropium (ATROVENT) 0.03 % nasal spray   Each Nare   Place 1 spray into both nostrils as needed.         Marland Kitchen LUMIGAN 0.01 % SOLN   Both Eyes   Place 1 drop into both eyes at bedtime.         . Multiple Vitamin (MULTIVITAMIN) tablet   Oral   Take 1 tablet by mouth daily.         . niacin (SLO-NIACIN) 500 MG tablet   Oral   Take 500 mg by mouth daily.         . Omega-3 Krill Oil 300 MG CAPS   Oral   Take 300 mg by mouth daily.         . timolol (BETIMOL) 0.5 % ophthalmic solution      1 drop 2 (two) times daily.         . valsartan-hydrochlorothiazide (DIOVAN-HCT) 320-12.5  MG per tablet   Oral   Take 1 tablet by mouth daily.          BP 160/72  Pulse 72  Temp(Src) 97.8 F (36.6 C) (Oral)  Resp 18  Ht 5' 2.5" (1.588 m)  Wt 152 lb 7 oz (69.145 kg)  BMI 27.42 kg/m2  SpO2 98% Physical Exam  Nursing note and vitals reviewed. Constitutional: She is oriented to person, place, and time. She appears well-developed and well-nourished.  HENT:  Head: Normocephalic.  Laceration of midline upper lip, does not cross vermillion border  Eyes: EOM are normal. Pupils are equal, round, and reactive to light.  Neck: Normal range of motion. Neck supple.  Cardiovascular: Normal rate, normal heart sounds and intact distal pulses.   Pulmonary/Chest: Effort normal and breath sounds normal.  Abdominal: Bowel sounds are normal. She exhibits no distension. There is no tenderness.  Musculoskeletal: Normal range of motion. She exhibits no edema and no tenderness.  Neurological: She is alert and oriented to person, place, and time. She has normal strength. No cranial nerve deficit or sensory deficit.  Skin: Skin is warm and dry. No rash noted.  Psychiatric: She has a normal mood and  affect.    ED Course  Procedures (including critical care time)  LACERATION REPAIR Performed by: Truddie Hidden. Consent: Verbal consent obtained. Risks and benefits: risks, benefits and alternatives were discussed Patient identity confirmed: provided demographic data Time out performed prior to procedure Prepped and Draped in normal sterile fashion Wound explored Laceration Location: upper lip Laceration Length: 2.5 cm stellate No Foreign Bodies seen or palpated Anesthesia: local infiltration Local anesthetic: lidocaine 2% no epinephrine Anesthetic total: 2 ml Irrigation method: syringe Amount of cleaning: standard Skin closure: 5-0 Vicryl Rapide Number of sutures or staples: 5 Technique: simple interrupted Patient tolerance: Patient tolerated the procedure well with no immediate complications.    Labs Review Labs Reviewed - No data to display Imaging Review Ct Head Wo Contrast  04/01/2013   CLINICAL DATA:  Fall hitting forehead.  EXAM: CT HEAD WITHOUT CONTRAST  TECHNIQUE: Contiguous axial images were obtained from the base of the skull through the vertex without intravenous contrast.  COMPARISON:  None.  FINDINGS: No skull fracture.  Partial opacification of the head sphenoid sinuses without air-fluid level or adjacent fracture. Mastoid air cells, middle ear cavities and remainder of visualized paranasal sinuses are clear.  No intracranial hemorrhage.  Small vessel disease type changes without CT evidence of large acute infarct.  Global atrophy without hydrocephalus.  No intracranial mass lesion noted on this unenhanced exam.  Orbital structures unremarkable.  Vascular calcifications.  IMPRESSION: No skull fracture or intracranial hemorrhage.  Partial opacification sphenoid sinus air cells.  Atrophy.  Please see above.   Electronically Signed   By: Chauncey Cruel M.D.   On: 04/01/2013 10:43    EKG Interpretation   None       MDM   1. Laceration of lip     CT neg,  laceration repair. Wound care instructions given. No dental fractures.     Charles B. Karle Starch, MD 04/01/13 1114

## 2013-04-01 NOTE — ED Notes (Signed)
Patient stumped her toe this am on carpet and fell face forward onto carpet. No loc, small abrasion to top lip, no other complaints. No bleeding from lip

## 2013-04-01 NOTE — Discharge Instructions (Signed)
Mouth Laceration A mouth laceration is a cut inside the mouth. TREATMENT  Because of all the bacteria in the mouth, lacerations are usually not stitched (sutured) unless the wound is gaping open. Sometimes, a couple sutures may be placed just to hold the edges of the wound together and to speed healing. Over the next 1 to 2 days, you will see that the wound edges appear gray in color. The edges may appear ragged and slightly spread apart. Because of all the normal bacteria in the mouth, these wounds are contaminated, but this is not an infection that needs antibiotics. Most wounds heal with no problems despite their appearance. HOME CARE INSTRUCTIONS   Rinse your mouth with a warm, saltwater wash 4 to 6 times per day, or as your caregiver instructs.  Continue oral hygiene and gentle tooth brushing as normal, if possible.  Do not eat or drink hot food or beverages while your mouth is still numb.  Eat a bland diet to avoid irritation from acidic foods.  Only take over-the-counter or prescription medicines for pain, discomfort, or fever as directed by your caregiver.  Follow up with your caregiver as instructed. You may need to see your caregiver for a wound check in 48 to 72 hours to make sure your wound is healing.  If your laceration was sutured, do not play with the sutures or knots with your tongue. If you do this, they will gradually loosen and may become untied. You may need a tetanus shot if:  You cannot remember when you had your last tetanus shot.  You have never had a tetanus shot. If you get a tetanus shot, your arm may swell, get red, and feel warm to the touch. This is common and not a problem. If you need a tetanus shot and you choose not to have one, there is a rare chance of getting tetanus. Sickness from tetanus can be serious. SEEK MEDICAL CARE IF:   You develop swelling or increasing pain in the wound or in other parts of your face.  You have a fever.  You develop  swollen, tender glands in the throat.  You notice the wound edges do not stay together after your sutures have been removed.  You see pus coming from the wound. Some drainage in the mouth is normal. MAKE SURE YOU:   Understand these instructions.  Will watch your condition.  Will get help right away if you are not doing well or get worse. Document Released: 02/22/2005 Document Revised: 05/17/2011 Document Reviewed: 08/27/2010 [facility] Patient Information 2014 [place], [place].

## 2013-04-03 ENCOUNTER — Ambulatory Visit (HOSPITAL_COMMUNITY)
Admission: RE | Admit: 2013-04-03 | Discharge: 2013-04-03 | Disposition: A | Discharge status: Home / Self Care | Payer: Medicare PPO | Source: Ambulatory Visit | Attending: Cardiology | Admitting: Cardiology

## 2013-04-03 DIAGNOSIS — I6521 Occlusion and stenosis of right carotid artery: Secondary | ICD-10-CM

## 2013-04-03 DIAGNOSIS — I658 Occlusion and stenosis of other precerebral arteries: Secondary | ICD-10-CM | POA: Insufficient documentation

## 2013-04-03 DIAGNOSIS — E785 Hyperlipidemia, unspecified: Secondary | ICD-10-CM

## 2013-04-03 DIAGNOSIS — I6529 Occlusion and stenosis of unspecified carotid artery: Secondary | ICD-10-CM | POA: Insufficient documentation

## 2013-04-03 NOTE — Progress Notes (Signed)
Carotid Duplex Completed. °Brianna L Mazza,RVT °

## 2013-04-05 ENCOUNTER — Telehealth: Payer: Self-pay | Admitting: *Deleted

## 2013-04-05 DIAGNOSIS — I6529 Occlusion and stenosis of unspecified carotid artery: Secondary | ICD-10-CM

## 2013-04-05 NOTE — Telephone Encounter (Signed)
Message copied by Raiford Simmonds on Thu Apr 05, 2013 12:24 PM ------      Message from: Leonie Man      Created: Thu Apr 05, 2013  7:31 AM       Stable narrowing on R carotid -- still moderate      Some worsening of L Carotid - now in the 70-99% (low range) ;            Recommendation would be recheck in 6 months & consider referral to Dr. Gwenlyn Found to discuss potential treatment options if this worsens on follow-up dopplers (recommended in 6 months).      My inclination was that she would not be interested, but we need to be sure.            Leonie Man, MD       ------

## 2013-04-05 NOTE — Telephone Encounter (Signed)
Left message to call back  

## 2013-04-05 NOTE — Progress Notes (Signed)
Quick Note:  Stable narrowing on R carotid -- still moderate Some worsening of L Carotid - now in the 70-99% (low range) ;  Recommendation would be recheck in 6 months & consider referral to Dr. Gwenlyn Found to discuss potential treatment options if this worsens on follow-up dopplers (recommended in 6 months). My inclination was that she would not be interested, but we need to be sure.  Leonie Man, MD  ______

## 2013-04-06 MED ORDER — EZETIMIBE-SIMVASTATIN 10-40 MG PO TABS: 1.0000 | ORAL_TABLET | Freq: Every day | ORAL | Status: DC

## 2013-04-06 NOTE — Telephone Encounter (Signed)
Spoke to patient. Carotid doppler results. Verbalized understanding Patient stated she wanted to wait and speak with Dr Gwenlyn Found after the 6 months follow -up.  She wanted to discuss with her daughter (who is a nurse),if she change her mind ,she states that she will call back and mak an ppointment  She also request samples of Vyotrin 10/40- samples left at front desk (4)

## 2013-05-09 ENCOUNTER — Telehealth: Payer: Self-pay | Admitting: Cardiology

## 2013-05-09 NOTE — Telephone Encounter (Signed)
Please call-would like the results of her doppler from 04-10-13. She also would like some samples of Vytorin 10-40 mg please.

## 2013-05-09 NOTE — Telephone Encounter (Signed)
Spoke to patient. RN informed patient - no samples available of vytorin - offered to call into a pharmacy  Patient states she has to call back because she wants to change pharmacy and she have to ask her daughter which one is closer to her.  RN informed patient she received results on 04/05/13. Patient states she remember now the conversation

## 2013-05-10 MED ORDER — EZETIMIBE-SIMVASTATIN 10-40 MG PO TABS: 1.0000 | ORAL_TABLET | Freq: Every day | ORAL | Status: DC

## 2013-05-10 NOTE — Telephone Encounter (Signed)
Samples now available.    Returned call and left message samples left at front desk. Call back before 4pm if questions.

## 2013-06-15 ENCOUNTER — Telehealth: Payer: Self-pay | Admitting: Cardiology

## 2013-06-15 MED ORDER — EZETIMIBE-SIMVASTATIN 10-40 MG PO TABS: 1.0000 | ORAL_TABLET | Freq: Every day | ORAL | Status: DC

## 2013-06-15 NOTE — Telephone Encounter (Signed)
Would like samples of Vytorin 10/40 mg .. Please call   Thanks

## 2013-06-15 NOTE — Telephone Encounter (Signed)
Returned call and pt informed samples left at front desk.  Pt verbalized understanding and agreed w/ plan.    

## 2013-07-11 ENCOUNTER — Telehealth: Payer: Self-pay | Admitting: Cardiology

## 2013-07-11 NOTE — Telephone Encounter (Signed)
Spoke with patient let her know the samples of Vytorin would be at the front desk for her. Also asked patient if she had decided on a new pharmacy, pt. States her daughter can not make her mind up, but she will speak with her about it again.

## 2013-07-11 NOTE — Telephone Encounter (Signed)
Need some samples of Vytorin 10/40 please.

## 2013-09-20 ENCOUNTER — Ambulatory Visit (HOSPITAL_COMMUNITY)
Admission: RE | Admit: 2013-09-20 | Discharge: 2013-09-20 | Disposition: A | Discharge status: Home / Self Care | Payer: Medicare PPO | Source: Ambulatory Visit | Attending: Cardiology | Admitting: Cardiology

## 2013-09-20 ENCOUNTER — Telehealth: Payer: Self-pay | Admitting: *Deleted

## 2013-09-20 DIAGNOSIS — I6529 Occlusion and stenosis of unspecified carotid artery: Secondary | ICD-10-CM

## 2013-09-20 DIAGNOSIS — I6523 Occlusion and stenosis of bilateral carotid arteries: Secondary | ICD-10-CM

## 2013-09-20 MED ORDER — EZETIMIBE-SIMVASTATIN 10-40 MG PO TABS: 1.0000 | ORAL_TABLET | Freq: Every day | ORAL | Status: DC

## 2013-09-20 NOTE — Progress Notes (Signed)
Carotid Duplex Completed. °Brianna L Mazza,RVT °

## 2013-09-20 NOTE — Telephone Encounter (Signed)
Patient walked in requesting samples. Samples given to front desk staff to provide to patient after testing.

## 2013-09-26 ENCOUNTER — Telehealth: Payer: Self-pay | Admitting: *Deleted

## 2013-09-26 ENCOUNTER — Telehealth: Payer: Self-pay | Admitting: Cardiology

## 2013-09-26 DIAGNOSIS — I6523 Occlusion and stenosis of bilateral carotid arteries: Secondary | ICD-10-CM

## 2013-09-26 NOTE — Telephone Encounter (Signed)
Carotid doppler results given. Order placed for recheck in 6 months.  Patient voiced understanding.

## 2013-09-26 NOTE — Telephone Encounter (Signed)
Message copied by Tressa Busman on Wed Sep 26, 2013  2:25 PM ------      Message from: Ellyn Hack, DAVID W      Created: Wed Sep 26, 2013 12:04 AM       Stable Carotid duplex --> LICA ~low range of 19-16%; RICA 50-69%            F/u ~ 6 months            HARDING,DAVID W, MD       ------

## 2013-09-26 NOTE — Telephone Encounter (Signed)
Results of doppler given to patient.  Voiced understanding.

## 2013-09-26 NOTE — Telephone Encounter (Signed)
Patient is returning a call from Barbara 

## 2013-11-05 ENCOUNTER — Telehealth: Payer: Self-pay | Admitting: Cardiology

## 2013-11-05 MED ORDER — EZETIMIBE-SIMVASTATIN 10-40 MG PO TABS: 1.0000 | ORAL_TABLET | Freq: Every day | ORAL | Status: DC

## 2013-11-05 NOTE — Telephone Encounter (Signed)
Pt would like some samples of Vytorin 10/40 please. °

## 2013-11-05 NOTE — Telephone Encounter (Signed)
Samples given to front desk, patient called and informed they were ready for pick up. Patient voiced understanding

## 2013-11-26 ENCOUNTER — Other Ambulatory Visit: Payer: Self-pay

## 2013-11-26 DIAGNOSIS — Z1231 Encounter for screening mammogram for malignant neoplasm of breast: Secondary | ICD-10-CM

## 2013-12-04 ENCOUNTER — Telehealth: Payer: Self-pay | Admitting: Cardiology

## 2013-12-04 MED ORDER — EZETIMIBE-SIMVASTATIN 10-40 MG PO TABS: 1.0000 | ORAL_TABLET | Freq: Every day | ORAL | Status: DC

## 2013-12-04 NOTE — Telephone Encounter (Signed)
Pt called wanting some samples for Vytorin 10/40  Thanks

## 2013-12-04 NOTE — Telephone Encounter (Signed)
Spoke with pt, aware samples at the front desk for pick up 

## 2013-12-20 ENCOUNTER — Ambulatory Visit
Admission: RE | Admit: 2013-12-20 | Discharge: 2013-12-20 | Disposition: A | Discharge status: Home / Self Care | Payer: Medicare PPO | Source: Ambulatory Visit

## 2013-12-20 DIAGNOSIS — Z1231 Encounter for screening mammogram for malignant neoplasm of breast: Secondary | ICD-10-CM

## 2014-01-23 ENCOUNTER — Encounter (HOSPITAL_BASED_OUTPATIENT_CLINIC_OR_DEPARTMENT_OTHER): Payer: Self-pay

## 2014-01-23 ENCOUNTER — Emergency Department (HOSPITAL_BASED_OUTPATIENT_CLINIC_OR_DEPARTMENT_OTHER)
Admission: EM | Admit: 2014-01-23 | Discharge: 2014-01-23 | Disposition: A | Discharge status: Home / Self Care | Payer: Medicare PPO | Attending: Emergency Medicine | Admitting: Emergency Medicine

## 2014-01-23 DIAGNOSIS — Z23 Encounter for immunization: Secondary | ICD-10-CM | POA: Diagnosis not present

## 2014-01-23 DIAGNOSIS — Y998 Other external cause status: Secondary | ICD-10-CM | POA: Diagnosis not present

## 2014-01-23 DIAGNOSIS — Y9389 Activity, other specified: Secondary | ICD-10-CM | POA: Diagnosis not present

## 2014-01-23 DIAGNOSIS — I10 Essential (primary) hypertension: Secondary | ICD-10-CM | POA: Diagnosis not present

## 2014-01-23 DIAGNOSIS — E785 Hyperlipidemia, unspecified: Secondary | ICD-10-CM | POA: Insufficient documentation

## 2014-01-23 DIAGNOSIS — W231XXA Caught, crushed, jammed, or pinched between stationary objects, initial encounter: Secondary | ICD-10-CM | POA: Diagnosis not present

## 2014-01-23 DIAGNOSIS — S61412A Laceration without foreign body of left hand, initial encounter: Secondary | ICD-10-CM | POA: Diagnosis not present

## 2014-01-23 DIAGNOSIS — Y9289 Other specified places as the place of occurrence of the external cause: Secondary | ICD-10-CM | POA: Insufficient documentation

## 2014-01-23 DIAGNOSIS — Z8551 Personal history of malignant neoplasm of bladder: Secondary | ICD-10-CM | POA: Diagnosis not present

## 2014-01-23 DIAGNOSIS — Z79899 Other long term (current) drug therapy: Secondary | ICD-10-CM | POA: Insufficient documentation

## 2014-01-23 DIAGNOSIS — Z87891 Personal history of nicotine dependence: Secondary | ICD-10-CM | POA: Diagnosis not present

## 2014-01-23 DIAGNOSIS — Z7982 Long term (current) use of aspirin: Secondary | ICD-10-CM | POA: Diagnosis not present

## 2014-01-23 HISTORY — DX: Malignant neoplasm of bladder, unspecified: C67.9

## 2014-01-23 MED ORDER — TETANUS-DIPHTH-ACELL PERTUSSIS 5-2.5-18.5 LF-MCG/0.5 IM SUSP
0.5000 mL | Freq: Once | INTRAMUSCULAR | Status: AC
  Administered 2014-01-23: 0.5 mL via INTRAMUSCULAR
  Filled 2014-01-23: qty 0.5

## 2014-01-23 NOTE — ED Provider Notes (Signed)
CSN: [id number]     Arrival date & time 01/23/14  1524 History   First MD Initiated Contact with Patient 01/23/14 1607     Chief Complaint  Patient presents with  . Hand Injury     (Consider location/radiation/quality/duration/timing/severity/associated sxs/prior Treatment) HPI  80 year old female presents with left hand laceration. She was trying to stop an elevator door from closing and as she pulled her hand out because the doors were continuing to close she scraped it on one of the doors. Patient did not get her hand crushed. States it stings but denies significant pain and declines pain medicine. Is unsure of her last tetanus immunization. Patient denies a weakness or numbness. Bleeding is controlled.  Past Medical History  Diagnosis Date  . Hypertension, essential, benign   . Dyslipidemia     Managed by PCP. On statin  . Moderate Right-sided carotid artery disease     50-70% stenosis on the right carotid artery from 2011. Also noted less than 50% right and left subclavian artery stenosis.  . Bladder cancer    Past Surgical History  Procedure Laterality Date  . Transthoracic echocardiogram  August 7    Normal EF, impaired relaxation with mildly sclerotic aortic valve but no stenosis. Mild left atrial dilation.  Marland Kitchen Nm myoview ltd  August 2007    No ischemia or infarction  . Carotid dopplers  July 2011    50-70% stenosis of the right carotid with <50% stenosis of both the right and left subclavian arteries.  . Bladder tumor excision     No family history on file. History  Substance Use Topics  . Smoking status: Former Smoker    Quit date: 03/08/1978  . Smokeless tobacco: Never Used  . Alcohol Use: Yes     Comment: occas   OB History    No data available     Review of Systems  Musculoskeletal: Negative for joint swelling.  Skin: Positive for wound.  Neurological: Negative for weakness and numbness.  All other systems reviewed and are negative.     Allergies    Review of patient's allergies indicates no known allergies.  Home Medications   Prior to Admission medications   Medication Sig Start Date End Date Taking? Authorizing Provider  aspirin EC 81 MG tablet Take 81 mg by mouth daily.    Historical Provider, MD  ezetimibe-simvastatin (VYTORIN) 10-40 MG per tablet Take 1 tablet by mouth at bedtime. 12/04/13   Leonie Man, MD  ipratropium (ATROVENT) 0.03 % nasal spray Place 1 spray into both nostrils as needed. 02/12/13   Historical Provider, MD  LUMIGAN 0.01 % SOLN Place 1 drop into both eyes at bedtime. 03/07/13   Historical Provider, MD  Multiple Vitamin (MULTIVITAMIN) tablet Take 1 tablet by mouth daily.    Historical Provider, MD  niacin (SLO-NIACIN) 500 MG tablet Take 500 mg by mouth daily.    Historical Provider, MD  Omega-3 Krill Oil 300 MG CAPS Take 300 mg by mouth daily.    Historical Provider, MD  timolol (BETIMOL) 0.5 % ophthalmic solution 1 drop 2 (two) times daily.    Historical Provider, MD  valsartan-hydrochlorothiazide (DIOVAN-HCT) 320-12.5 MG per tablet Take 1 tablet by mouth daily.    Historical Provider, MD   BP 163/50 mmHg  Pulse 73  Temp(Src) 98.7 F (37.1 C) (Oral)  Resp 18  Ht 5' 2.5" (1.588 m)  Wt 152 lb (68.947 kg)  BMI 27.34 kg/m2  SpO2 97% Physical Exam  Constitutional: She is  oriented to person, place, and time. She appears well-developed and well-nourished. No distress.  HENT:  Head: Normocephalic and atraumatic.  Right Ear: External ear normal.  Left Ear: External ear normal.  Nose: Nose normal.  Eyes: Right eye exhibits no discharge. Left eye exhibits no discharge.  Cardiovascular: Normal rate, regular rhythm and intact distal pulses.   Pulses:      Radial pulses are 2+ on the left side.  Pulmonary/Chest: Effort normal.  Abdominal: She exhibits no distension.  Musculoskeletal:       Left hand: She exhibits laceration. She exhibits no tenderness, normal capillary refill and no swelling.        Hands: Patient has normal capillary refill, sensation, and movement of left hand  Neurological: She is alert and oriented to person, place, and time.  Skin: Skin is warm and dry.  Nursing note and vitals reviewed.   ED Course  Procedures (including critical care time) Labs Review Labs Reviewed - No data to display  Imaging Review No results found.   EKG Interpretation None      MDM   Final diagnoses:  Hand laceration, left, initial encounter    Patient with left hand superficial laceration/skin tear. No actual crush injury. Neurovascularly intact. Wound irrigated and Steri-Strips replaced to approximate wound. Discussed return precautions and infection precautions. Tetanus updated.    Ephraim Hamburger, MD 01/23/14 [phone]

## 2014-01-23 NOTE — ED Notes (Signed)
Cut back of left hand stopping elevator door approx 2pm today

## 2014-01-23 NOTE — Discharge Instructions (Signed)
Laceration Care, Adult °A laceration is a cut or lesion that goes through all layers of the skin and into the tissue just beneath the skin. °TREATMENT  °Some lacerations may not require closure. Some lacerations may not be able to be closed due to an increased risk of infection. It is important to see your caregiver as soon as possible after an injury to minimize the risk of infection and maximize the opportunity for successful closure. °If closure is appropriate, pain medicines may be given, if needed. The wound will be cleaned to help prevent infection. Your caregiver will use stitches (sutures), staples, wound glue (adhesive), or skin adhesive strips to repair the laceration. These tools bring the skin edges together to allow for faster healing and a better cosmetic outcome. However, all wounds will heal with a scar. Once the wound has healed, scarring can be minimized by covering the wound with sunscreen during the day for 1 full year. °HOME CARE INSTRUCTIONS  °For sutures or staples: °· Keep the wound clean and dry. °· If you were given a bandage (dressing), you should change it at least once a day. Also, change the dressing if it becomes wet or dirty, or as directed by your caregiver. °· Wash the wound with soap and water 2 times a day. Rinse the wound off with water to remove all soap. Pat the wound dry with a clean towel. °· After cleaning, apply a thin layer of the antibiotic ointment as recommended by your caregiver. This will help prevent infection and keep the dressing from sticking. °· You may shower as usual after the first 24 hours. Do not soak the wound in water until the sutures are removed. °· Only take over-the-counter or prescription medicines for pain, discomfort, or fever as directed by your caregiver. °· Get your sutures or staples removed as directed by your caregiver. °For skin adhesive strips: °· Keep the wound clean and dry. °· Do not get the skin adhesive strips wet. You may bathe  carefully, using caution to keep the wound dry. °· If the wound gets wet, pat it dry with a clean towel. °· Skin adhesive strips will fall off on their own. You may trim the strips as the wound heals. Do not remove skin adhesive strips that are still stuck to the wound. They will fall off in time. °For wound adhesive: °· You may briefly wet your wound in the shower or bath. Do not soak or scrub the wound. Do not swim. Avoid periods of heavy perspiration until the skin adhesive has fallen off on its own. After showering or bathing, gently pat the wound dry with a clean towel. °· Do not apply liquid medicine, cream medicine, or ointment medicine to your wound while the skin adhesive is in place. This may loosen the film before your wound is healed. °· If a dressing is placed over the wound, be careful not to apply tape directly over the skin adhesive. This may cause the adhesive to be pulled off before the wound is healed. °· Avoid prolonged exposure to sunlight or tanning lamps while the skin adhesive is in place. Exposure to ultraviolet light in the first year will darken the scar. °· The skin adhesive will usually remain in place for 5 to 10 days, then naturally fall off the skin. Do not pick at the adhesive film. °You may need a tetanus shot if: °· You cannot remember when you had your last tetanus shot. °· You have never had a tetanus   shot. If you get a tetanus shot, your arm may swell, get red, and feel warm to the touch. This is common and not a problem. If you need a tetanus shot and you choose not to have one, there is a rare chance of getting tetanus. Sickness from tetanus can be serious. SEEK MEDICAL CARE IF:   You have redness, swelling, or increasing pain in the wound.  You see a red line that goes away from the wound.  You have yellowish-white fluid (pus) coming from the wound.  You have a fever.  You notice a bad smell coming from the wound or dressing.  Your wound breaks open before or  after sutures have been removed.  You notice something coming out of the wound such as wood or glass.  Your wound is on your hand or foot and you cannot move a finger or toe. SEEK IMMEDIATE MEDICAL CARE IF:   Your pain is not controlled with prescribed medicine.  You have severe swelling around the wound causing pain and numbness or a change in color in your arm, hand, leg, or foot.  Your wound splits open and starts bleeding.  You have worsening numbness, weakness, or loss of function of any joint around or beyond the wound.  You develop painful lumps near the wound or on the skin anywhere on your body. MAKE SURE YOU:   Understand these instructions.  Will watch your condition.  Will get help right away if you are not doing well or get worse. Document Released: 02/22/2005 Document Revised: 05/17/2011 Document Reviewed: 08/18/2010 [facility] Patient Information 2015 [place], [place]. This information is not intended to replace advice given to you by your health care provider. Make sure you discuss any questions you have with your health care provider.   Sterile Tape Wound Care Some cuts and wounds can be closed using sterile tape, also called skin adhesive strips. Skin adhesive strips can be used for shallow (superficial) and simple cuts, wounds, lacerations, and surgical incisions. These strips act in place of stitches to hold the edges of the wound together, allowing for faster healing. Unlike stitches, the adhesive strips do not require needles or anesthetic medicine for placement. The strips will wear off naturally as the wound is healing. It is important to take proper care of your wound at home while it heals.  HOME CARE INSTRUCTIONS  Try to keep the area around your wound clean and dry. Do not allow the adhesive strips to get wet for the first 12 hours.   Do not use any soaps or ointments on the wound for the first 12 hours.   If a bandage (dressing) has been applied, follow  your health care provider's instructions for how often to change the dressing. Keep the dressing dry if one has been applied.   Do not remove the adhesive strips. They will fall off on their own. If they do not, you may remove them gently after 10 days. You should gently wet the strips before removing them. For example, this can be done in the shower.  Do not scratch, pick, or rub the wound area.   Protect the wound from further injury until it is healed.   Protect the wound from sun and tanning bed exposure while it is healing and for several weeks after healing.   Only take over-the-counter or prescription medicines as directed by your health care provider.   Keep all follow-up appointments as directed by your health care provider.  [place]  CARE IF: Your adhesive strips become wet or soaked with blood before the wound has healed. The tape will need to be replaced.  SEEK IMMEDIATE MEDICAL CARE IF:  You have increasing pain in the wound.   You develop a rash after the strips are applied.  Your wound becomes red, swollen, hot, or tender.   You have a red streak that goes away from the wound.   You have pus coming from the wound.   You have increased bleeding from the wound.  You notice a bad smell coming from the wound.   Your wound breaks open. MAKE SURE YOU:  Understand these instructions.  Will watch your condition.  Will get help right away if you are not doing well or get worse. Document Released: 04/01/2004 Document Revised: 12/13/2012 Document Reviewed: 09/13/2012 [facility] Patient Information 2015 [place], [place]. This information is not intended to replace advice given to you by your health care provider. Make sure you discuss any questions you have with your health care provider.

## 2014-01-28 ENCOUNTER — Other Ambulatory Visit: Payer: Self-pay | Admitting: Cardiology

## 2014-01-28 NOTE — Telephone Encounter (Signed)
Pt would like some samples of Vytorin please. °

## 2014-01-28 NOTE — Telephone Encounter (Signed)
Left message for patient, aware samples placed at the front desk for pick up

## 2014-02-25 ENCOUNTER — Telehealth: Payer: Self-pay | Admitting: Cardiology

## 2014-02-25 MED ORDER — EZETIMIBE-SIMVASTATIN 10-40 MG PO TABS: 1.0000 | ORAL_TABLET | Freq: Every day | ORAL | Status: DC

## 2014-02-25 NOTE — Telephone Encounter (Signed)
SPOKE TO PATIENT INFORMED SAMPLES AVAILABLE FOR PICK UP SHE STATES SHE WILL PICK UP TOMORROW.

## 2014-02-25 NOTE — Telephone Encounter (Signed)
Pt would like some samples of Vytorin please.

## 2014-03-12 ENCOUNTER — Telehealth: Payer: Self-pay | Admitting: Cardiology

## 2014-03-12 NOTE — Telephone Encounter (Signed)
Received records from [facility] (Dr Prince Solian) for appointment with Dr Ellyn Hack on 03/21/14.  Records given to [facility] (medical records) for Dr Allison Quarry schedule on 03/21/14.  lp

## 2014-03-13 ENCOUNTER — Encounter: Payer: Self-pay | Admitting: Cardiology

## 2014-03-14 ENCOUNTER — Ambulatory Visit (HOSPITAL_COMMUNITY)
Admission: RE | Admit: 2014-03-14 | Discharge: 2014-03-14 | Disposition: A | Discharge status: Home / Self Care | Payer: Medicare PPO | Source: Ambulatory Visit | Attending: Cardiovascular Disease | Admitting: Cardiovascular Disease

## 2014-03-14 DIAGNOSIS — I672 Cerebral atherosclerosis: Secondary | ICD-10-CM | POA: Insufficient documentation

## 2014-03-14 DIAGNOSIS — I6523 Occlusion and stenosis of bilateral carotid arteries: Secondary | ICD-10-CM | POA: Diagnosis not present

## 2014-03-14 NOTE — Progress Notes (Signed)
Carotid Duplex Completed. °Brianna L Mazza,RVT °

## 2014-03-16 NOTE — Progress Notes (Signed)
Quick Note:  CAROTID DOPPLERS: Stable findings compared to last year.  R internal Carotid - 50-69%, tortuous (no change) L Internal Carotid - ~70%, tortuous (no change) Bilateral Subclavian: < 50%.  Recheck in 12 months.  Leonie Man, MD  ______

## 2014-03-16 NOTE — Progress Notes (Signed)
Quick Note:  Lab Results from PCP office: Cr: 0.7 - normal; other CMP labs are normal CBC - normal LIPIDS: TC 155, TG 129, HDL 45 (all @ goal), LDL 84 (close to goal with carotid disease) No change to current Rx)  Stacey Walker, Leonie Green, MD  ______

## 2014-03-21 ENCOUNTER — Encounter: Payer: Self-pay | Admitting: Cardiology

## 2014-03-21 ENCOUNTER — Ambulatory Visit (INDEPENDENT_AMBULATORY_CARE_PROVIDER_SITE_OTHER): Payer: Medicare PPO | Admitting: Cardiology

## 2014-03-21 VITALS — BP 128/82 | HR 66 | Ht 62.5 in | Wt 153.7 lb

## 2014-03-21 DIAGNOSIS — I10 Essential (primary) hypertension: Secondary | ICD-10-CM

## 2014-03-21 DIAGNOSIS — I779 Disorder of arteries and arterioles, unspecified: Secondary | ICD-10-CM

## 2014-03-21 DIAGNOSIS — E785 Hyperlipidemia, unspecified: Secondary | ICD-10-CM

## 2014-03-21 DIAGNOSIS — I739 Peripheral vascular disease, unspecified: Principal | ICD-10-CM

## 2014-03-21 NOTE — Patient Instructions (Signed)
Your physician wants you to follow-up in: 2 years with Dr.Harding. You will receive a reminder letter in the mail two months in advance. If you don't receive a letter, please call our office to schedule the follow-up appointment.

## 2014-03-22 ENCOUNTER — Encounter: Payer: Self-pay | Admitting: Cardiology

## 2014-03-22 NOTE — Progress Notes (Signed)
PCP: Donnajean Lopes, MD  Clinic Note: Chief Complaint  Patient presents with  . Follow-up    Moderate carotid disease, hypertension and dyslipidemia  . Shortness of Breath    On exertion   HPI: Stacey Walker is a 81 y.o. female with a PMH below who presents today for annual followup of her  Moderate  Carotid artery disease, confirmed on current Dopplers.  Past Medical History  Diagnosis Date  . Hypertension, essential, benign   . Dyslipidemia     Managed by PCP. On statin  . Moderate Right-sided carotid artery disease     50-70% stenosis on the right carotid artery from 2011. Also noted less than 50% right and left subclavian artery stenosis.  . Bladder cancer   . Asymptomatic carotid artery stenosis     Dopplers January 2016: RICA 50-69% tortuous (no change; L ICA ~70%, tortuous (no change), bilateral subclavian arteries <50%    Prior Cardiac Evaluation and Past Surgical History: Past Surgical History  Procedure Laterality Date  . Transthoracic echocardiogram  August 7    Normal EF, impaired relaxation with mildly sclerotic aortic valve but no stenosis. Mild left atrial dilation.  Marland Kitchen Nm myoview ltd  August 2007    No ischemia or infarction  . Carotid dopplers  July 2011    50-70% stenosis of the right carotid with <50% stenosis of both the right and left subclavian arteries.  . Bladder tumor excision     Interval History: she really denies any major problems today. 2 stable cardiac standpoint with no history at all ever having anginal symptoms with rest or exertion. She does have some mild exertional dyspnea when she is walking back to the apartment from the cafeteria. She is having to walk with a walker and is a bit frustrated by that. More complicated she has balance issues now. She got up to walk as fast as he used to.   No PND, orthopnea or edema. No palpitations, lightheadedness, dizziness, weakness or syncope/near syncope. No TIA/amaurosis fugax symptoms.  ROS: A  comprehensive was performed. Review of Systems  Constitutional: Negative for fever, chills and malaise/fatigue.  HENT: Positive for nosebleeds.   Respiratory: Positive for shortness of breath (Noted is the PCP, but did not comment on that to me. Chronic exertional dyspnea with walking. Usually okay walking to dinner but gets short of breath walking back.).   Cardiovascular: Negative for chest pain and claudication.  Gastrointestinal: Negative for blood in stool and melena.  Genitourinary: Positive for dysuria. Negative for hematuria.       Reason UTI  Musculoskeletal: Positive for falls (Last year in January).  Neurological: Negative for dizziness, tingling and tremors.       Gait instability. Has to use a walker. No radiculopathy.  Endo/Heme/Allergies: Negative.   Psychiatric/Behavioral: Negative for depression and memory loss. The patient is not nervous/anxious.   All other systems reviewed and are negative.   Current Outpatient Prescriptions on File Prior to Visit  Medication Sig Dispense Refill  . aspirin EC 81 MG tablet Take 81 mg by mouth daily.    Marland Kitchen ezetimibe-simvastatin (VYTORIN) 10-40 MG per tablet Take 1 tablet by mouth at bedtime. 21 tablet 0  . ipratropium (ATROVENT) 0.03 % nasal spray Place 1 spray into both nostrils as needed.    Marland Kitchen LUMIGAN 0.01 % SOLN Place 1 drop into both eyes at bedtime.    . Multiple Vitamin (MULTIVITAMIN) tablet Take 1 tablet by mouth daily.    . niacin (SLO-NIACIN)  500 MG tablet Take 500 mg by mouth daily.    . Omega-3 Krill Oil 300 MG CAPS Take 300 mg by mouth daily.    . timolol (BETIMOL) 0.5 % ophthalmic solution 1 drop 2 (two) times daily.    . valsartan-hydrochlorothiazide (DIOVAN-HCT) 320-12.5 MG per tablet Take 1 tablet by mouth daily.     No current facility-administered medications on file prior to visit.   No Known Allergies History   Social History Narrative   Widowed mother of one, grandmother of one. She quit smoking in 1980. She  has a rare alcohol beverage from time to time. She currently lives at [place] on [address].   She is able to get around there without to much difficulty.   During previous visit, she voiced the desire for DO NOT RESUSCITATE and DO NOT INTUBATE. She also is with the desire to avoid any invasive procedures.    SOCIAL AND FAMILY HISTORY REVIEWED IN EPIC -- no change  Wt Readings from Last 3 Encounters:  03/21/14 153 lb 11.2 oz (69.718 kg)  01/23/14 152 lb (68.947 kg)  04/01/13 152 lb 7 oz (69.145 kg)    PHYSICAL EXAM BP 128/82 mmHg  Pulse 66  Ht 5' 2.5" (1.588 m)  Wt 153 lb 11.2 oz (69.718 kg)  BMI 27.65 kg/m2 General appearance: alert, cooperative, appears younger than stated age, no distress; Healthy-appearing Neck: no adenopathy, no carotid bruit and no JVD Lungs: clear to auscultation bilaterally, normal percussion bilaterally and non-labored Heart: regular rate and rhythm, S1 7 S2 normal, 2/6 SEM c.-d @  RUSB, click, rub or gallop ; nondisplaced PMI Abdomen: soft, non-tender; bowel sounds normal; no masses,  no organomegaly;  Extremities: extremities normal, atraumatic, no cyanosis,or edema  Pulses: 2+ and symmetric;  Skin: normal and mobility and turgor normal  Neurologic: Mental status: Alert, oriented, thought content appropriate; Cranial nerves: normal (II-XII grossly intact)   Adult ECG Report  Rate: 56 ;  Rhythm: normal sinus rhythm and Nonspecific ST and T wave changes. Leftward axis (24. Otherwise normal intervals, durations and voltage.  Narrative Interpretation: normal EKG  Recent Labs:    March 13 2013  Urine culture from December 2015: Klebsiella pneumonia --> ampicillin resistant  CMP: Sodium 138, potassium 4.4, chloride 102, bicarbonate 30, BUN 10, creatinine 0.7, glucose 17; total protein 6.7, albumin 3.5, ALT/AST 14/21, alkaline phosphatase 47, total bilirubin 0.5;  CBC:W7 0.0, H./H. 13.4/41, platelet 394.; Hgb A1c 5.8   Lipid: TC 155,  TG 129 HDL 45 with LDL 84.  ASSESSMENT / PLAN: Essentially very stable elderly woman with no major active cardiac problems. She has stable carotid artery disease being monitored with Dopplers. In the absence of disease related symptoms, would not consider invasive evaluation or treatment. I think she is stable to followup on an every two-year basis. She has close followup with her PCP, who can easily refer her back sooner if symptoms arise.  Bilateral carotid artery disease Recently evaluated: CAROTID DOPPLERS: Stable findings compared to last year.  R internal Carotid - 50-69%, tortuous (no change) L Internal Carotid - ~70%, tortuous (no change) Bilateral Subclavian: < 50%  Recommended followup as annual. Continued to be asymptomatic. For now plan we'll need to continue expectant management with lipids and blood pressure control.  Aspirin prophylaxis   Hypertension, essential, benign Well-controlled on HCTZ/ARB combination.   Dyslipidemia Well-controlled as noted above. On omega-3 Krill oil as well as Vytorin.    Orders Placed This Encounter  Procedures  .  EKG 12-Lead   No orders of the defined types were placed in this encounter.    Followup: 2 years   Silas Sedam, Leonie Green, M.D., M.S. Interventional Cardiologist   Pager # [phone]

## 2014-03-22 NOTE — Assessment & Plan Note (Addendum)
Recently evaluated: CAROTID DOPPLERS: Stable findings compared to last year.  R internal Carotid - 50-69%, tortuous (no change) L Internal Carotid - ~70%, tortuous (no change) Bilateral Subclavian: < 50%  Recommended followup as annual. Continued to be asymptomatic. For now plan we'll need to continue expectant management with lipids and blood pressure control.  Aspirin prophylaxis

## 2014-03-22 NOTE — Assessment & Plan Note (Signed)
Well-controlled on HCTZ/ARB combination.

## 2014-03-22 NOTE — Assessment & Plan Note (Signed)
Well-controlled as noted above. On omega-3 Krill oil as well as Vytorin.

## 2014-03-26 ENCOUNTER — Other Ambulatory Visit: Payer: Self-pay | Admitting: *Deleted

## 2014-03-26 DIAGNOSIS — I739 Peripheral vascular disease, unspecified: Principal | ICD-10-CM

## 2014-03-26 DIAGNOSIS — I779 Disorder of arteries and arterioles, unspecified: Secondary | ICD-10-CM

## 2014-04-04 DIAGNOSIS — H04123 Dry eye syndrome of bilateral lacrimal glands: Secondary | ICD-10-CM | POA: Diagnosis not present

## 2014-04-04 DIAGNOSIS — Z961 Presence of intraocular lens: Secondary | ICD-10-CM | POA: Diagnosis not present

## 2014-04-04 DIAGNOSIS — H4011X2 Primary open-angle glaucoma, moderate stage: Secondary | ICD-10-CM | POA: Diagnosis not present

## 2014-04-04 DIAGNOSIS — H16103 Unspecified superficial keratitis, bilateral: Secondary | ICD-10-CM | POA: Diagnosis not present

## 2014-04-22 ENCOUNTER — Telehealth: Payer: Self-pay | Admitting: Cardiology

## 2014-04-22 NOTE — Telephone Encounter (Signed)
Pt would like samples of Vytorin please.

## 2014-04-23 MED ORDER — EZETIMIBE-SIMVASTATIN 10-40 MG PO TABS: 1.0000 | ORAL_TABLET | Freq: Every day | ORAL | Status: DC

## 2014-04-23 NOTE — Telephone Encounter (Signed)
  LM for patient letting her know that samples are up front for pick up

## 2014-05-23 ENCOUNTER — Other Ambulatory Visit: Payer: Self-pay | Admitting: Cardiology

## 2014-05-23 NOTE — Telephone Encounter (Signed)
Pt would like samples of Vytorin please.

## 2014-05-23 NOTE — Telephone Encounter (Signed)
Left message on VM that samples were ready for pick up

## 2014-05-23 NOTE — Telephone Encounter (Signed)
Medication samples have been provided to the patient.  Drug name: vytorin 10/40  Qty: 28  LOT: [id number]  Exp.Date: 09/2014  Samples left at front desk for patient pick-up. Patient notified.  Sheral Apley M 12:43 PM 05/23/2014

## 2014-06-24 ENCOUNTER — Telehealth: Payer: Self-pay | Admitting: Cardiology

## 2014-06-24 NOTE — Telephone Encounter (Signed)
Medication samples have been provided to the patient.  Drug name: vytorin 10/40  Qty: 38  LOT: [id number]  Exp.Date: 09/2014  Samples left at front desk for patient pick-up. Patient notified - aware to use before July 2016  Fidel Levy 11:50 AM 06/24/2014

## 2014-06-24 NOTE — Telephone Encounter (Signed)
Pt would like some samples of Vytorin please.

## 2014-06-30 ENCOUNTER — Inpatient Hospital Stay (HOSPITAL_BASED_OUTPATIENT_CLINIC_OR_DEPARTMENT_OTHER)
Admission: EM | Admit: 2014-06-30 | Discharge: 2014-07-03 | DRG: 194 | Disposition: A | Discharge status: Skilled Nursing Facility | Payer: Medicare PPO | Attending: Internal Medicine | Admitting: Internal Medicine

## 2014-06-30 ENCOUNTER — Emergency Department (HOSPITAL_BASED_OUTPATIENT_CLINIC_OR_DEPARTMENT_OTHER): Payer: Medicare PPO

## 2014-06-30 ENCOUNTER — Encounter (HOSPITAL_BASED_OUTPATIENT_CLINIC_OR_DEPARTMENT_OTHER): Payer: Self-pay | Admitting: *Deleted

## 2014-06-30 DIAGNOSIS — R05 Cough: Secondary | ICD-10-CM

## 2014-06-30 DIAGNOSIS — Z8541 Personal history of malignant neoplasm of cervix uteri: Secondary | ICD-10-CM | POA: Diagnosis not present

## 2014-06-30 DIAGNOSIS — R531 Weakness: Secondary | ICD-10-CM

## 2014-06-30 DIAGNOSIS — I10 Essential (primary) hypertension: Secondary | ICD-10-CM | POA: Diagnosis present

## 2014-06-30 DIAGNOSIS — Z90710 Acquired absence of both cervix and uterus: Secondary | ICD-10-CM

## 2014-06-30 DIAGNOSIS — R778 Other specified abnormalities of plasma proteins: Secondary | ICD-10-CM

## 2014-06-30 DIAGNOSIS — R059 Cough, unspecified: Secondary | ICD-10-CM

## 2014-06-30 DIAGNOSIS — E871 Hypo-osmolality and hyponatremia: Secondary | ICD-10-CM | POA: Diagnosis present

## 2014-06-30 DIAGNOSIS — R9431 Abnormal electrocardiogram [ECG] [EKG]: Secondary | ICD-10-CM | POA: Diagnosis not present

## 2014-06-30 DIAGNOSIS — E785 Hyperlipidemia, unspecified: Secondary | ICD-10-CM | POA: Diagnosis not present

## 2014-06-30 DIAGNOSIS — Z66 Do not resuscitate: Secondary | ICD-10-CM | POA: Diagnosis not present

## 2014-06-30 DIAGNOSIS — I771 Stricture of artery: Secondary | ICD-10-CM | POA: Diagnosis present

## 2014-06-30 DIAGNOSIS — R7989 Other specified abnormal findings of blood chemistry: Secondary | ICD-10-CM | POA: Diagnosis not present

## 2014-06-30 DIAGNOSIS — Z87891 Personal history of nicotine dependence: Secondary | ICD-10-CM

## 2014-06-30 DIAGNOSIS — J189 Pneumonia, unspecified organism: Principal | ICD-10-CM

## 2014-06-30 DIAGNOSIS — Z7982 Long term (current) use of aspirin: Secondary | ICD-10-CM | POA: Diagnosis not present

## 2014-06-30 DIAGNOSIS — R0602 Shortness of breath: Secondary | ICD-10-CM | POA: Diagnosis not present

## 2014-06-30 DIAGNOSIS — I9589 Other hypotension: Secondary | ICD-10-CM | POA: Diagnosis not present

## 2014-06-30 DIAGNOSIS — R911 Solitary pulmonary nodule: Secondary | ICD-10-CM | POA: Diagnosis present

## 2014-06-30 HISTORY — DX: Malignant neoplasm of cervix uteri, unspecified: C53.9

## 2014-06-30 LAB — BASIC METABOLIC PANEL
ANION GAP: 7 (ref 5–15)
BUN: 9 mg/dL (ref 6–23)
CO2: 26 mmol/L (ref 19–32)
Calcium: 9.4 mg/dL (ref 8.4–10.5)
Chloride: 100 mmol/L (ref 96–112)
Creatinine, Ser: 0.74 mg/dL (ref 0.50–1.10)
GFR calc Af Amer: 86 mL/min — ABNORMAL LOW (ref >90)
GFR calc non Af Amer: 74 mL/min — ABNORMAL LOW (ref >90)
Glucose, Bld: 126 mg/dL — ABNORMAL HIGH (ref 70–99)
Potassium: 3.7 mmol/L (ref 3.5–5.1)
Sodium: 133 mmol/L — ABNORMAL LOW (ref 135–145)

## 2014-06-30 LAB — CBC WITH DIFFERENTIAL/PLATELET
Basophils Absolute: 0 10*3/uL (ref 0.0–0.1)
Basophils Relative: 0 % (ref 0–1)
Eosinophils Absolute: 0 10*3/uL (ref 0.0–0.7)
Eosinophils Relative: 0 % (ref 0–5)
HEMATOCRIT: 41.7 % (ref 36.0–46.0)
Hemoglobin: 14 g/dL (ref 12.0–15.0)
Lymphocytes Relative: 14 % (ref 12–46)
Lymphs Abs: 1.2 10*3/uL (ref 0.7–4.0)
MCH: 29.8 pg (ref 26.0–34.0)
MCHC: 33.6 g/dL (ref 30.0–36.0)
MCV: 88.7 fL (ref 78.0–100.0)
Monocytes Absolute: 1.1 10*3/uL — ABNORMAL HIGH (ref 0.1–1.0)
Monocytes Relative: 13 % — ABNORMAL HIGH (ref 3–12)
NEUTROS PCT: 73 % (ref 43–77)
Neutro Abs: 5.9 10*3/uL (ref 1.7–7.7)
Platelets: 260 10*3/uL (ref 150–400)
RBC: 4.7 MIL/uL (ref 3.87–5.11)
RDW: 14.2 % (ref 11.5–15.5)
WBC: 8.3 10*3/uL (ref 4.0–10.5)

## 2014-06-30 LAB — URINALYSIS, ROUTINE W REFLEX MICROSCOPIC
Bilirubin Urine: NEGATIVE
Glucose, UA: NEGATIVE mg/dL
Ketones, ur: NEGATIVE mg/dL
LEUKOCYTES UA: NEGATIVE
Nitrite: NEGATIVE
Protein, ur: NEGATIVE mg/dL
Specific Gravity, Urine: 1.012 (ref 1.005–1.030)
Urobilinogen, UA: 0.2 mg/dL (ref 0.0–1.0)
pH: 7.5 (ref 5.0–8.0)

## 2014-06-30 LAB — URINE MICROSCOPIC-ADD ON

## 2014-06-30 LAB — BRAIN NATRIURETIC PEPTIDE: B NATRIURETIC PEPTIDE 5: 133.7 pg/mL — AB (ref 0.0–100.0)

## 2014-06-30 LAB — TROPONIN I: Troponin I: 0.18 ng/mL — ABNORMAL HIGH (ref <0.031)

## 2014-06-30 MED ORDER — TIMOLOL MALEATE 0.5 % OP SOLN
1.0000 [drp] | Freq: Two times a day (BID) | OPHTHALMIC | Status: DC
  Administered 2014-07-01 – 2014-07-03 (×3): 1 [drp] via OPHTHALMIC
  Filled 2014-06-30: qty 5

## 2014-06-30 MED ORDER — CARVEDILOL 3.125 MG PO TABS
3.1250 mg | ORAL_TABLET | Freq: Two times a day (BID) | ORAL | Status: DC
  Filled 2014-06-30 (×3): qty 1

## 2014-06-30 MED ORDER — CEFTRIAXONE SODIUM 1 G IJ SOLR
INTRAMUSCULAR | Status: AC
  Filled 2014-06-30: qty 10

## 2014-06-30 MED ORDER — LORATADINE 10 MG PO TABS
10.0000 mg | ORAL_TABLET | Freq: Every day | ORAL | Status: DC
  Administered 2014-07-03: 10 mg via ORAL
  Filled 2014-06-30 (×4): qty 1

## 2014-06-30 MED ORDER — SODIUM CHLORIDE 0.9 % IJ SOLN
3.0000 mL | Freq: Two times a day (BID) | INTRAMUSCULAR | Status: DC
  Administered 2014-07-01 – 2014-07-03 (×5): 3 mL via INTRAVENOUS

## 2014-06-30 MED ORDER — AZITHROMYCIN 500 MG IV SOLR
INTRAVENOUS | Status: AC
  Filled 2014-06-30: qty 500

## 2014-06-30 MED ORDER — ASPIRIN EC 81 MG PO TBEC
81.0000 mg | DELAYED_RELEASE_TABLET | Freq: Every day | ORAL | Status: DC
  Administered 2014-07-01 – 2014-07-03 (×3): 81 mg via ORAL
  Filled 2014-06-30 (×4): qty 1

## 2014-06-30 MED ORDER — DEXTROSE 5 % IV SOLN
1.0000 g | Freq: Once | INTRAVENOUS | Status: AC
  Administered 2014-06-30: 1 g via INTRAVENOUS

## 2014-06-30 MED ORDER — LATANOPROST 0.005 % OP SOLN
1.0000 [drp] | Freq: Every day | OPHTHALMIC | Status: DC
  Administered 2014-07-01 – 2014-07-02 (×2): 1 [drp] via OPHTHALMIC
  Filled 2014-06-30: qty 2.5

## 2014-06-30 MED ORDER — EZETIMIBE-SIMVASTATIN 10-40 MG PO TABS
1.0000 | ORAL_TABLET | Freq: Every day | ORAL | Status: DC
  Filled 2014-06-30 (×2): qty 1

## 2014-06-30 MED ORDER — IOHEXOL 350 MG/ML SOLN
100.0000 mL | Freq: Once | INTRAVENOUS | Status: AC | PRN
  Administered 2014-06-30: 100 mL via INTRAVENOUS

## 2014-06-30 MED ORDER — OMEGA-3 KRILL OIL 300 MG PO CAPS: 300.0000 mg | ORAL_CAPSULE | Freq: Every day | ORAL | Status: DC

## 2014-06-30 MED ORDER — ACETAMINOPHEN 325 MG PO TABS: 650.0000 mg | ORAL_TABLET | Freq: Four times a day (QID) | ORAL | Status: DC | PRN

## 2014-06-30 MED ORDER — NIACIN ER 500 MG PO TBCR
500.0000 mg | EXTENDED_RELEASE_TABLET | Freq: Every day | ORAL | Status: DC
  Administered 2014-06-30 – 2014-07-03 (×4): 500 mg via ORAL
  Filled 2014-06-30 (×4): qty 1

## 2014-06-30 MED ORDER — CEFTRIAXONE SODIUM IN DEXTROSE 20 MG/ML IV SOLN
1.0000 g | INTRAVENOUS | Status: DC
  Administered 2014-07-01 – 2014-07-02 (×2): 1 g via INTRAVENOUS
  Filled 2014-06-30 (×3): qty 50

## 2014-06-30 MED ORDER — ACETAMINOPHEN 650 MG RE SUPP: 650.0000 mg | Freq: Four times a day (QID) | RECTAL | Status: DC | PRN

## 2014-06-30 MED ORDER — DEXTROSE 5 % IV SOLN
500.0000 mg | INTRAVENOUS | Status: DC
  Administered 2014-07-01 – 2014-07-03 (×3): 500 mg via INTRAVENOUS
  Filled 2014-06-30 (×3): qty 500

## 2014-06-30 MED ORDER — IRBESARTAN 300 MG PO TABS
300.0000 mg | ORAL_TABLET | Freq: Every day | ORAL | Status: DC
  Filled 2014-06-30: qty 1

## 2014-06-30 MED ORDER — TIMOLOL HEMIHYDRATE 0.5 % OP SOLN: 1.0000 [drp] | Freq: Two times a day (BID) | OPHTHALMIC | Status: DC

## 2014-06-30 MED ORDER — IPRATROPIUM BROMIDE 0.06 % NA SOLN
2.0000 | Freq: Two times a day (BID) | NASAL | Status: DC
  Administered 2014-07-03: 2 via NASAL
  Filled 2014-06-30: qty 15

## 2014-06-30 MED ORDER — DEXTROSE 5 % IV SOLN
500.0000 mg | Freq: Once | INTRAVENOUS | Status: AC
  Administered 2014-06-30: 500 mg via INTRAVENOUS

## 2014-06-30 MED ORDER — IPRATROPIUM BROMIDE 0.03 % NA SOLN
1.0000 | Freq: Two times a day (BID) | NASAL | Status: DC
  Filled 2014-06-30: qty 30

## 2014-06-30 MED ORDER — ENOXAPARIN SODIUM 80 MG/0.8ML ~~LOC~~ SOLN
1.0000 mg/kg | Freq: Two times a day (BID) | SUBCUTANEOUS | Status: AC
  Administered 2014-06-30 – 2014-07-01 (×2): 70 mg via SUBCUTANEOUS
  Filled 2014-06-30 (×2): qty 0.8

## 2014-06-30 MED ORDER — SODIUM CHLORIDE 0.9 % IV BOLUS (SEPSIS)
250.0000 mL | Freq: Once | INTRAVENOUS | Status: AC
  Administered 2014-06-30: 250 mL via INTRAVENOUS

## 2014-06-30 MED ORDER — SODIUM CHLORIDE 0.9 % IV SOLN
INTRAVENOUS | Status: AC
  Administered 2014-06-30: 22:00:00 via INTRAVENOUS

## 2014-06-30 MED ORDER — ACETAMINOPHEN 325 MG PO TABS
650.0000 mg | ORAL_TABLET | Freq: Once | ORAL | Status: AC
Start: 2014-06-30 — End: 2014-06-30
  Administered 2014-06-30: 650 mg via ORAL
  Filled 2014-06-30: qty 2

## 2014-06-30 NOTE — Progress Notes (Signed)
Dr Maudie Mercury at bedside, informed of patient refusing lab earlier.

## 2014-06-30 NOTE — ED Notes (Signed)
2 warm blankets given. Pt resting well. Family member at bedside.

## 2014-06-30 NOTE — ED Provider Notes (Signed)
CSN: [id number]     Arrival date & time 06/30/14  1006 History   First MD Initiated Contact with Patient 06/30/14 1014     Chief Complaint  Patient presents with  . Weakness     (Consider location/radiation/quality/duration/timing/severity/associated sxs/prior Treatment) HPI Comments: Patient presents to the emergency department for evaluation of generalized weakness. Patient reports that since yesterday evening she has been feeling very weak, is having trouble walking around her apartment because of the weakness. She reports that she has noticed that her allergies have been acting up the last couple of days, did take a Claritin last night. Daughter is wondering if the Claritin is causing the weakness. Patient reports nasal congestion, rhinorrhea and hacking cough. She is not short of breath. She has not had any nausea, vomiting or diarrhea. There is no chest pain or shortness of breath.   Past Medical History  Diagnosis Date  . Hypertension, essential, benign   . Dyslipidemia     Managed by PCP. On statin  . Moderate Right-sided carotid artery disease     50-70% stenosis on the right carotid artery from 2011. Also noted less than 50% right and left subclavian artery stenosis.  . Bladder cancer   . Asymptomatic carotid artery stenosis     Dopplers January 2016: RICA 50-69% tortuous (no change; L ICA ~70%, tortuous (no change), bilateral subclavian arteries <50%   Past Surgical History  Procedure Laterality Date  . Transthoracic echocardiogram  August 7    Normal EF, impaired relaxation with mildly sclerotic aortic valve but no stenosis. Mild left atrial dilation.  Marland Kitchen Nm myoview ltd  August 2007    No ischemia or infarction  . Carotid dopplers  July 2011    50-70% stenosis of the right carotid with <50% stenosis of both the right and left subclavian arteries.  . Bladder tumor excision     History reviewed. No pertinent family history. History  Substance Use Topics  . Smoking  status: Former Smoker    Quit date: 03/08/1978  . Smokeless tobacco: Never Used  . Alcohol Use: Yes     Comment: occas   OB History    No data available     Review of Systems  Constitutional: Positive for fatigue.  HENT: Positive for congestion.   Respiratory: Positive for cough.   All other systems reviewed and are negative.     Allergies  Review of patient's allergies indicates no known allergies.  Home Medications   Prior to Admission medications   Medication Sig Start Date End Date Taking? Authorizing Provider  cetirizine (ZYRTEC) 10 MG tablet Take 10 mg by mouth daily.   Yes Historical Provider, MD  aspirin EC 81 MG tablet Take 81 mg by mouth daily.    Historical Provider, MD  ezetimibe-simvastatin (VYTORIN) 10-40 MG per tablet Take 1 tablet by mouth at bedtime. 04/23/14   Leonie Man, MD  ipratropium (ATROVENT) 0.03 % nasal spray Place 1 spray into both nostrils as needed. 02/12/13   Historical Provider, MD  LUMIGAN 0.01 % SOLN Place 1 drop into both eyes at bedtime. 03/07/13   Historical Provider, MD  Multiple Vitamin (MULTIVITAMIN) tablet Take 1 tablet by mouth daily.    Historical Provider, MD  niacin (SLO-NIACIN) 500 MG tablet Take 500 mg by mouth daily.    Historical Provider, MD  Omega-3 Krill Oil 300 MG CAPS Take 300 mg by mouth daily.    Historical Provider, MD  timolol (BETIMOL) 0.5 % ophthalmic solution 1 drop  2 (two) times daily.    Historical Provider, MD  valsartan-hydrochlorothiazide (DIOVAN-HCT) 320-12.5 MG per tablet Take 1 tablet by mouth daily.    Historical Provider, MD   BP 131/32 mmHg  Pulse 78  Temp(Src) 100.1 F (37.8 C) (Oral)  Resp 18  Ht 5\' 2"  (1.575 m)  Wt 151 lb (68.493 kg)  BMI 27.61 kg/m2  SpO2 97% Physical Exam  Constitutional: She is oriented to person, place, and time. She appears well-developed and well-nourished. No distress.  HENT:  Head: Normocephalic and atraumatic.  Right Ear: Hearing normal.  Left Ear: Hearing normal.   Nose: Rhinorrhea present.  Mouth/Throat: Oropharynx is clear and moist and mucous membranes are normal.  Eyes: Conjunctivae and EOM are normal. Pupils are equal, round, and reactive to light.  Neck: Normal range of motion. Neck supple.  Cardiovascular: Regular rhythm, S1 normal and S2 normal.  Exam reveals no gallop and no friction rub.   No murmur heard. Pulmonary/Chest: Effort normal and breath sounds normal. No respiratory distress. She exhibits no tenderness.  Abdominal: Soft. Normal appearance and bowel sounds are normal. There is no hepatosplenomegaly. There is no tenderness. There is no rebound, no guarding, no tenderness at McBurney's point and negative Murphy's sign. No hernia.  Musculoskeletal: Normal range of motion.  Neurological: She is alert and oriented to person, place, and time. She has normal strength. No cranial nerve deficit or sensory deficit. Coordination normal. GCS eye subscore is 4. GCS verbal subscore is 5. GCS motor subscore is 6.  Skin: Skin is warm, dry and intact. No rash noted. No cyanosis.  Psychiatric: She has a normal mood and affect. Her speech is normal and behavior is normal. Thought content normal.  Nursing note and vitals reviewed.   ED Course  Procedures (including critical care time) Labs Review Labs Reviewed  URINALYSIS, ROUTINE W REFLEX MICROSCOPIC - Abnormal; Notable for the following:    Hgb urine dipstick MODERATE (*)    All other components within normal limits  CBC WITH DIFFERENTIAL/PLATELET - Abnormal; Notable for the following:    Monocytes Relative 13 (*)    Monocytes Absolute 1.1 (*)    All other components within normal limits  BASIC METABOLIC PANEL - Abnormal; Notable for the following:    Sodium 133 (*)    Glucose, Bld 126 (*)    GFR calc non Af Amer 74 (*)    GFR calc Af Amer 86 (*)    All other components within normal limits  TROPONIN I - Abnormal; Notable for the following:    Troponin I 0.18 (*)    All other components  within normal limits  URINE MICROSCOPIC-ADD ON - Abnormal; Notable for the following:    Bacteria, UA FEW (*)    All other components within normal limits  BRAIN NATRIURETIC PEPTIDE - Abnormal; Notable for the following:    B Natriuretic Peptide 133.7 (*)    All other components within normal limits  CULTURE, BLOOD (ROUTINE X 2)  CULTURE, BLOOD (ROUTINE X 2)    Imaging Review Dg Chest 2 View  06/30/2014   CLINICAL DATA:  Acute onset generalized weakness approximately 12 hr ago. Current history of hypertension and bladder cancer.  EXAM: CHEST  2 VIEW  COMPARISON:  11/17/2009, 12/29/2007.  FINDINGS: Suboptimal inspiration accounts for mild atelectasis in the lung bases. Cardiac silhouette normal in size, unchanged. Mildly prominent bronchovascular markings diffusely and central peribronchial thickening, unchanged. Lungs otherwise clear. No pleural effusions. No pneumothorax. Degenerative changes and DISH involving  the thoracic spine.  IMPRESSION: Suboptimal inspiration accounts for mild bibasilar atelectasis. No acute cardiopulmonary disease otherwise. Stable mild changes of chronic bronchitis and/or asthma.   Electronically Signed   By: Evangeline Dakin M.D.   On: 06/30/2014 11:50   Ct Angio Chest Pe W/cm &/or Wo Cm  06/30/2014   CLINICAL DATA:  Shortness of breath, dry cough since yesterday, weakness and fatigue.  EXAM: CT ANGIOGRAPHY CHEST WITH CONTRAST  TECHNIQUE: Multidetector CT imaging of the chest was performed using the standard protocol during bolus administration of intravenous contrast. Multiplanar CT image reconstructions and MIPs were obtained to evaluate the vascular anatomy.  CONTRAST:  198mL OMNIPAQUE IOHEXOL 350 MG/ML SOLN  COMPARISON:  Chest radiograph 06/30/2014  FINDINGS: Mediastinum/Nodes: Heart size is normal. No pericardial effusion. No lymphadenopathy. Moderate atheromatous aortic calcification and coronary arterial calcification.  The examination is adequate for evaluation for  acute pulmonary embolism up to and including the 3rd order pulmonary arteries.  Lungs/Pleura: Emphysematous changes are identified. There is a patchy irregular area of ground-glass airspace opacity right upper lobe 2.1 x 0.9 cm with internal air bronchogram formation. No other measurable nodule or mass is identified. No other consolidation. No pleural effusion.  Upper abdomen: Normal  Musculoskeletal: Multilevel disc degenerative change identified.  Review of the MIP images confirms the above findings.  IMPRESSION: No CT evidence for acute pulmonary embolism.  2.1 cm right upper lobe patchy area of ground-glass airspace opacity with internal air bronchogram formation. Given the history provided, this could represent infection but low-grade adenocarcinoma could appear similar. Initial follow-up by chest CT without contrast is recommended in 3 months to confirm persistence. This recommendation follows the consensus statement: Recommendations for the Management of Subsolid Pulmonary Nodules Detected at CT: A Statement from the [place] as published in Radiology 2013; 266:304-317.   Electronically Signed   By: Conchita Paris M.D.   On: 06/30/2014 13:59     EKG Interpretation   Date/Time:  Sunday June 30 2014 10:34:55 EDT Ventricular Rate:  65 PR Interval:  160 QRS Duration: 98 QT Interval:  410 QTC Calculation: 426 R Axis:   12 Text Interpretation:  Normal sinus rhythm Nonspecific T wave abnormality  Abnormal ECG Confirmed by Saralee Bolick  MD, Kathi Dohn [phone]) on 06/30/2014  10:48:01 AM      MDM   Final diagnoses:  Cough  Shortness of breath  CAP (community acquired pneumonia)  Elevated troponin    She presented to the ER for evaluation of generalized weakness and dyspnea on exertion. Patient has been experiencing what she thinks is allergic type symptoms for the last couple of days, but in the last 12-24 hours has gotten significant weakness. She has been having difficulty  ambulating around her apartment because of shortness of breath. EKG did not show any acute ischemia or infarct at arrival, but troponin was elevated. She does not have any known coronary artery disease, but does have risk factors. She is not, however, experiencing any chest pain and is not short of breath currently. PE study was therefore performed. No evidence of PE is seen, but patient does have evidence of pneumonia. She has also spiked a fever to 101.6.  Wynonia Lawman, call for cardiology. He did confirm that appropriate treatment will be treated for the pneumonia, no acute intervention for the elevated troponin at this time. No heparinization. Cycle enzymes, consult cardiology if elevating.  She will be admitted to the internal medicine hospitalist service.     Orpah Greek, MD 06/30/14 [phone]

## 2014-06-30 NOTE — ED Notes (Signed)
Attempted to call report to 5W -- nurse unavailable. Connye Burkitt, RN

## 2014-06-30 NOTE — Progress Notes (Signed)
Patient refusing lab draw. Tried to explain test to patient, patient started yelling "No, No, No", daughter at bedside and states "her cardiologist said it doesn't need to be done tonight, it can wait til tomorrow".

## 2014-06-30 NOTE — Progress Notes (Signed)
Attempt to reach Nurse at [facility] for report.

## 2014-06-30 NOTE — Progress Notes (Signed)
NURSING PROGRESS NOTE  Stacey Walker [id number] Transfer Data: 06/30/2014 6:21 PM Attending Provider: Louellen Molder, MD IDP:[id number], MD Code Status: FULL  Stacey Walker is a 81 y.o. female patient transferred from [place]  -No acute distress noted.  -No complaints of shortness of breath.  -No complaints of chest pain.    Blood pressure 146/61, pulse 73, temperature 98.9 F (37.2 C), temperature source Oral, resp. rate 20, height 5\' 2"  (1.575 m), weight 68.493 kg (151 lb), SpO2 94 %.    Allergies:  Review of patient's allergies indicates no known allergies.  Past Medical History:   has a past medical history of Hypertension, essential, benign; Dyslipidemia; Moderate Right-sided carotid artery disease; Bladder cancer; and Asymptomatic carotid artery stenosis.  Past Surgical History:   has past surgical history that includes transthoracic echocardiogram (August 7); nm myoview ltd (August 2007); Carotid Dopplers (July 2011); and Bladder tumor excision.  Social History:   reports that she quit smoking about 36 years ago. She has never used smokeless tobacco. She reports that she drinks alcohol. She reports that she does not use illicit drugs.  Skin: Intact  Patient/Family orientated to room. Information packet given to patient/family. Admission inpatient armband information verified with patient/family to include name and date of birth and placed on patient arm. Side rails up x 2, fall assessment and education completed with patient/family. Patient/family able to verbalize understanding of risk associated with falls and verbalized understanding to call for assistance before getting out of bed. Call light within reach. Patient/family able to voice and demonstrate understanding of unit orientation instructions.    Will continue to evaluate and treat per MD orders.

## 2014-06-30 NOTE — Progress Notes (Signed)
Patient refusing CT scan, Dr Maudie Mercury at nurses station advised of patient refusing, he talked to patient but patient continues to refuse CT.

## 2014-06-30 NOTE — ED Notes (Signed)
Pt c/o generalized weakness x 12 hrs

## 2014-06-30 NOTE — H&P (Signed)
Stacey Walker is an 81 y.o. female.    Stacey Walker (pcp) Chief Complaint: pneumonia HPI: 81 yo female with htn, hyperlipidemia c/o inability to walk.  Pt was weakness in arms and legs. + cough, dry.  x1 day.  Slight fever, sob.  Denies cp, palp, n/v, diarrhea, brbpr, black stool,  Pt came to ED and was found to have pneumonia vs lung nodule on CTA chest.  Pt was also noted to have + trop.  ? Cardiology consult requested by ED?, no note.  Pt will be admitted for pneumonia vs lung nodule. CT brain pending at this time.  Pt requests DNR status.    Past Medical History  Diagnosis Date  . Hypertension, essential, benign   . Dyslipidemia     Managed by PCP. On statin  . Moderate Right-sided carotid artery disease     50-70% stenosis on the right carotid artery from 2011. Also noted less than 50% right and left subclavian artery stenosis.  . Bladder cancer   . Asymptomatic carotid artery stenosis     Dopplers January 2016: RICA 50-69% tortuous (no change; L ICA ~70%, tortuous (no change), bilateral subclavian arteries <50%  . Cervical cancer     Past Surgical History  Procedure Laterality Date  . Transthoracic echocardiogram  August 7    Normal EF, impaired relaxation with mildly sclerotic aortic valve but no stenosis. Mild left atrial dilation.  Marland Kitchen Nm myoview ltd  August 2007    No ischemia or infarction  . Carotid dopplers  July 2011    50-70% stenosis of the right carotid with <50% stenosis of both the right and left subclavian arteries.  . Bladder tumor excision    . Cholecystectomy    . Abdominal hysterectomy      Family History  Problem Relation Age of Onset  . Lung cancer Father   . Dementia Mother    Social History:  reports that she quit smoking about 36 years ago. She has never used smokeless tobacco. She reports that she drinks alcohol. She reports that she does not use illicit drugs.  Allergies: No Known Allergies  Medications Prior to Admission  Medication Sig  Dispense Refill  . cetirizine (ZYRTEC) 10 MG tablet Take 10 mg by mouth daily.    Marland Kitchen aspirin EC 81 MG tablet Take 81 mg by mouth daily.    Marland Kitchen ezetimibe-simvastatin (VYTORIN) 10-40 MG per tablet Take 1 tablet by mouth at bedtime. 28 tablet 0  . ipratropium (ATROVENT) 0.03 % nasal spray Place 1 spray into both nostrils as needed.    Marland Kitchen LUMIGAN 0.01 % SOLN Place 1 drop into both eyes at bedtime.    . Multiple Vitamin (MULTIVITAMIN) tablet Take 1 tablet by mouth daily.    . niacin (SLO-NIACIN) 500 MG tablet Take 500 mg by mouth daily.    . Omega-3 Krill Oil 300 MG CAPS Take 300 mg by mouth daily.    . timolol (BETIMOL) 0.5 % ophthalmic solution 1 drop 2 (two) times daily.    . valsartan-hydrochlorothiazide (DIOVAN-HCT) 320-12.5 MG per tablet Take 1 tablet by mouth daily.      Results for orders placed or performed during the hospital encounter of 06/30/14 (from the past 48 hour(s))  Urinalysis, Routine w reflex microscopic     Status: Abnormal   Collection Time: 06/30/14 10:15 AM  Result Value Ref Range   Color, Urine YELLOW YELLOW   APPearance CLEAR CLEAR   Specific Gravity, Urine 1.012 1.005 - 1.030  pH 7.5 5.0 - 8.0   Glucose, UA NEGATIVE NEGATIVE mg/dL   Hgb urine dipstick MODERATE (A) NEGATIVE   Bilirubin Urine NEGATIVE NEGATIVE   Ketones, ur NEGATIVE NEGATIVE mg/dL   Protein, ur NEGATIVE NEGATIVE mg/dL   Urobilinogen, UA 0.2 0.0 - 1.0 mg/dL   Nitrite NEGATIVE NEGATIVE   Leukocytes, UA NEGATIVE NEGATIVE  Urine microscopic-add on     Status: Abnormal   Collection Time: 06/30/14 10:15 AM  Result Value Ref Range   Squamous Epithelial / LPF RARE RARE   WBC, UA 0-2 <3 WBC/hpf   RBC / HPF 11-20 <3 RBC/hpf   Bacteria, UA FEW (A) RARE  CBC with Differential/Platelet     Status: Abnormal   Collection Time: 06/30/14 10:30 AM  Result Value Ref Range   WBC 8.3 4.0 - 10.5 K/uL   RBC 4.70 3.87 - 5.11 MIL/uL   Hemoglobin 14.0 12.0 - 15.0 g/dL   HCT 41.7 36.0 - 46.0 %   MCV 88.7 78.0 -  100.0 fL   MCH 29.8 26.0 - 34.0 pg   MCHC 33.6 30.0 - 36.0 g/dL   RDW 14.2 11.5 - 15.5 %   Platelets 260 150 - 400 K/uL   Neutrophils Relative % 73 43 - 77 %   Neutro Abs 5.9 1.7 - 7.7 K/uL   Lymphocytes Relative 14 12 - 46 %   Lymphs Abs 1.2 0.7 - 4.0 K/uL   Monocytes Relative 13 (H) 3 - 12 %   Monocytes Absolute 1.1 (H) 0.1 - 1.0 K/uL   Eosinophils Relative 0 0 - 5 %   Eosinophils Absolute 0.0 0.0 - 0.7 K/uL   Basophils Relative 0 0 - 1 %   Basophils Absolute 0.0 0.0 - 0.1 K/uL  Basic metabolic panel     Status: Abnormal   Collection Time: 06/30/14 10:30 AM  Result Value Ref Range   Sodium 133 (L) 135 - 145 mmol/L   Potassium 3.7 3.5 - 5.1 mmol/L   Chloride 100 96 - 112 mmol/L   CO2 26 19 - 32 mmol/L   Glucose, Bld 126 (H) 70 - 99 mg/dL   BUN 9 6 - 23 mg/dL   Creatinine, Ser 0.74 0.50 - 1.10 mg/dL   Calcium 9.4 8.4 - 10.5 mg/dL   GFR calc non Af Amer 74 (L) >90 mL/min   GFR calc Af Amer 86 (L) >90 mL/min    Comment: (NOTE) The eGFR has been calculated using the CKD EPI equation. This calculation has not been validated in all clinical situations. eGFR's persistently <90 mL/min signify possible Chronic Kidney Disease.    Anion gap 7 5 - 15  Troponin I     Status: Abnormal   Collection Time: 06/30/14 10:30 AM  Result Value Ref Range   Troponin I 0.18 (H) <0.031 ng/mL    Comment:        PERSISTENTLY INCREASED TROPONIN VALUES IN THE RANGE OF 0.04-0.49 ng/mL CAN BE SEEN IN:       -UNSTABLE ANGINA       -CONGESTIVE HEART FAILURE       -MYOCARDITIS       -CHEST TRAUMA       -ARRYHTHMIAS       -LATE PRESENTING MYOCARDIAL INFARCTION       -COPD   CLINICAL FOLLOW-UP RECOMMENDED.   Brain natriuretic peptide     Status: Abnormal   Collection Time: 06/30/14 10:30 AM  Result Value Ref Range   B Natriuretic Peptide 133.7 (H) 0.0 -  100.0 pg/mL   Dg Chest 2 View  06/30/2014   CLINICAL DATA:  Acute onset generalized weakness approximately 12 hr ago. Current history of  hypertension and bladder cancer.  EXAM: CHEST  2 VIEW  COMPARISON:  11/17/2009, 12/29/2007.  FINDINGS: Suboptimal inspiration accounts for mild atelectasis in the lung bases. Cardiac silhouette normal in size, unchanged. Mildly prominent bronchovascular markings diffusely and central peribronchial thickening, unchanged. Lungs otherwise clear. No pleural effusions. No pneumothorax. Degenerative changes and DISH involving the thoracic spine.  IMPRESSION: Suboptimal inspiration accounts for mild bibasilar atelectasis. No acute cardiopulmonary disease otherwise. Stable mild changes of chronic bronchitis and/or asthma.   Electronically Signed   By: Evangeline Dakin M.D.   On: 06/30/2014 11:50   Ct Angio Chest Pe W/cm &/or Wo Cm  06/30/2014   CLINICAL DATA:  Shortness of breath, dry cough since yesterday, weakness and fatigue.  EXAM: CT ANGIOGRAPHY CHEST WITH CONTRAST  TECHNIQUE: Multidetector CT imaging of the chest was performed using the standard protocol during bolus administration of intravenous contrast. Multiplanar CT image reconstructions and MIPs were obtained to evaluate the vascular anatomy.  CONTRAST:  148m OMNIPAQUE IOHEXOL 350 MG/ML SOLN  COMPARISON:  Chest radiograph 06/30/2014  FINDINGS: Mediastinum/Nodes: Heart size is normal. No pericardial effusion. No lymphadenopathy. Moderate atheromatous aortic calcification and coronary arterial calcification.  The examination is adequate for evaluation for acute pulmonary embolism up to and including the 3rd order pulmonary arteries.  Lungs/Pleura: Emphysematous changes are identified. There is a patchy irregular area of ground-glass airspace opacity right upper lobe 2.1 x 0.9 cm with internal air bronchogram formation. No other measurable nodule or mass is identified. No other consolidation. No pleural effusion.  Upper abdomen: Normal  Musculoskeletal: Multilevel disc degenerative change identified.  Review of the MIP images confirms the above findings.   IMPRESSION: No CT evidence for acute pulmonary embolism.  2.1 cm right upper lobe patchy area of ground-glass airspace opacity with internal air bronchogram formation. Given the history provided, this could represent infection but low-grade adenocarcinoma could appear similar. Initial follow-up by chest CT without contrast is recommended in 3 months to confirm persistence. This recommendation follows the consensus statement: Recommendations for the Management of Subsolid Pulmonary Nodules Detected at CT: A Statement from the [place] published in Radiology 2013; 266:304-317.   Electronically Signed   By: GConchita ParisM.D.   On: 06/30/2014 13:59    Review of Systems  Constitutional: Positive for fever. Negative for chills, weight loss, malaise/fatigue and diaphoresis.  HENT: Negative.   Eyes: Negative.   Respiratory: Positive for cough and shortness of breath. Negative for hemoptysis and sputum production.   Cardiovascular: Negative.   Gastrointestinal: Negative.   Genitourinary: Negative.   Musculoskeletal: Negative.   Skin: Negative.   Neurological: Negative.  Negative for weakness.  Endo/Heme/Allergies: Negative.   Psychiatric/Behavioral: Negative.     Blood pressure 146/61, pulse 73, temperature 98.9 F (37.2 C), temperature source Oral, resp. rate 20, height 5' 2" (1.575 m), weight 69.31 kg (152 lb 12.8 oz), SpO2 94 %. Physical Exam  Constitutional: She is oriented to person, place, and time. She appears well-developed and well-nourished.  HENT:  Head: Normocephalic and atraumatic.  Mouth/Throat: No oropharyngeal exudate.  Eyes: Conjunctivae and EOM are normal. Pupils are equal, round, and reactive to light. Right eye exhibits no discharge. Left eye exhibits no discharge. No scleral icterus.  Neck: Normal range of motion. Neck supple. No JVD present. No tracheal deviation present. No thyromegaly present.  Cardiovascular: Normal  rate and regular rhythm.  Exam reveals no  gallop and no friction rub.   No murmur heard. Respiratory: Effort normal. No respiratory distress. She has no wheezes. She has rales. She exhibits no tenderness.  GI: Soft. Bowel sounds are normal. She exhibits no distension and no mass. There is no tenderness. There is no rebound and no guarding.  Musculoskeletal: Normal range of motion. She exhibits no edema or tenderness.  Lymphadenopathy:    She has no cervical adenopathy.  Neurological: She is alert and oriented to person, place, and time. She has normal reflexes. She displays normal reflexes. No cranial nerve deficit. She exhibits normal muscle tone. Coordination normal.  Skin: Skin is warm and dry. No rash noted. No erythema. No pallor.  Psychiatric: She has a normal mood and affect. Her behavior is normal. Judgment and thought content normal.     Assessment/Plan Pneumonia Cap Sputum cx if possible Blood culture x2 Rocephin iv, zithromax iv  + trop  Cont aspirin, start carvedilol, cont vytorin, diovan  Hyponatremia Hydrate gently with normal saline D/c hydrochlorothiazide If persistent consider serum osm, tsh cortisol, urine sodium, urine osm  Weakness  Check CT brain R/o CVA    DVT prophylaxis: scd,  Jani Gravel 06/30/2014, 8:26 PM

## 2014-06-30 NOTE — ED Notes (Signed)
MD at bedside. 

## 2014-07-01 DIAGNOSIS — R9431 Abnormal electrocardiogram [ECG] [EKG]: Secondary | ICD-10-CM

## 2014-07-01 DIAGNOSIS — E871 Hypo-osmolality and hyponatremia: Secondary | ICD-10-CM

## 2014-07-01 LAB — LEGIONELLA ANTIGEN, URINE

## 2014-07-01 LAB — COMPREHENSIVE METABOLIC PANEL
ALT: 13 U/L (ref 0–35)
AST: 22 U/L (ref 0–37)
Albumin: 2.8 g/dL — ABNORMAL LOW (ref 3.5–5.2)
Alkaline Phosphatase: 33 U/L — ABNORMAL LOW (ref 39–117)
Anion gap: 10 (ref 5–15)
BILIRUBIN TOTAL: 0.6 mg/dL (ref 0.3–1.2)
BUN: 7 mg/dL (ref 6–23)
CO2: 25 mmol/L (ref 19–32)
Calcium: 8.6 mg/dL (ref 8.4–10.5)
Chloride: 98 mmol/L (ref 96–112)
Creatinine, Ser: 0.85 mg/dL (ref 0.50–1.10)
GFR calc non Af Amer: 60 mL/min — ABNORMAL LOW (ref >90)
GFR, EST AFRICAN AMERICAN: 69 mL/min — AB (ref >90)
Glucose, Bld: 111 mg/dL — ABNORMAL HIGH (ref 70–99)
Potassium: 3.5 mmol/L (ref 3.5–5.1)
Sodium: 133 mmol/L — ABNORMAL LOW (ref 135–145)
Total Protein: 6.1 g/dL (ref 6.0–8.3)

## 2014-07-01 LAB — CBC WITH DIFFERENTIAL/PLATELET
BASOS ABS: 0 10*3/uL (ref 0.0–0.1)
BASOS PCT: 1 % (ref 0–1)
Eosinophils Absolute: 0 10*3/uL (ref 0.0–0.7)
Eosinophils Relative: 0 % (ref 0–5)
HCT: 37.7 % (ref 36.0–46.0)
Hemoglobin: 12.4 g/dL (ref 12.0–15.0)
LYMPHS ABS: 1.6 10*3/uL (ref 0.7–4.0)
LYMPHS PCT: 28 % (ref 12–46)
MCH: 29.3 pg (ref 26.0–34.0)
MCHC: 32.9 g/dL (ref 30.0–36.0)
MCV: 89.1 fL (ref 78.0–100.0)
MONO ABS: 1 10*3/uL (ref 0.1–1.0)
Monocytes Relative: 16 % — ABNORMAL HIGH (ref 3–12)
Neutro Abs: 3.3 10*3/uL (ref 1.7–7.7)
Neutrophils Relative %: 55 % (ref 43–77)
Platelets: 221 10*3/uL (ref 150–400)
RBC: 4.23 MIL/uL (ref 3.87–5.11)
RDW: 14.6 % (ref 11.5–15.5)
WBC: 5.9 10*3/uL (ref 4.0–10.5)

## 2014-07-01 LAB — TROPONIN I
Troponin I: 0.18 ng/mL — ABNORMAL HIGH (ref <0.031)
Troponin I: 0.2 ng/mL — ABNORMAL HIGH (ref <0.031)

## 2014-07-01 MED ORDER — VALSARTAN 160 MG PO TABS
320.0000 mg | ORAL_TABLET | Freq: Every day | ORAL | Status: DC
  Filled 2014-07-01 (×2): qty 2

## 2014-07-01 MED ORDER — VALSARTAN-HYDROCHLOROTHIAZIDE 320-12.5 MG PO TABS: 1.0000 | ORAL_TABLET | Freq: Every day | ORAL | Status: DC

## 2014-07-01 MED ORDER — VALSARTAN-HYDROCHLOROTHIAZIDE 320-12.5 MG PO TABS
1.0000 | ORAL_TABLET | Freq: Every day | ORAL | Status: DC
  Administered 2014-07-01: 1 via ORAL

## 2014-07-01 MED ORDER — HYDROCHLOROTHIAZIDE 12.5 MG PO CAPS: 12.5000 mg | ORAL_CAPSULE | Freq: Every day | ORAL | Status: DC

## 2014-07-01 MED ORDER — EZETIMIBE-SIMVASTATIN 10-40 MG PO TABS
1.0000 | ORAL_TABLET | Freq: Every day | ORAL | Status: DC
  Administered 2014-07-01 – 2014-07-03 (×3): 1 via ORAL
  Filled 2014-07-01 (×4): qty 1

## 2014-07-01 MED ORDER — HYDROCHLOROTHIAZIDE 12.5 MG PO CAPS
12.5000 mg | ORAL_CAPSULE | Freq: Every day | ORAL | Status: DC
  Filled 2014-07-01: qty 1

## 2014-07-01 MED ORDER — IRBESARTAN 300 MG PO TABS
300.0000 mg | ORAL_TABLET | Freq: Every day | ORAL | Status: DC
  Filled 2014-07-01: qty 1

## 2014-07-01 NOTE — Progress Notes (Addendum)
[facility] PROGRESS NOTE  Stacey Walker UDJ:[id number] DOB: Jun 19, 1925 DOA: 06/30/2014 PCP: Donnajean Lopes, MD  Brief narrative 81 year old female with history of hypertension, hyperlipidemia, carotid  artery disease who presented to [facility] on 4/24 with generalized weakness, dry cough with subjective fever and shortness of breath for 1 day duration. Chest x-ray was unremarkable and a CT angiogram of the chest done which was negative for PE but showed left upper lobe infiltrate. She also had a fever of 101.77F in the ED. During workup she was found to have elevated troponin of 0.18 and nonspecific T-wave changes in lateral leads. Patient admitted to [facility] for community-acquired pneumonia.   Assessment/Plan: Community acquired pneumonia Continue empiric Rocephin and azithromycin. Follow blood culture, sputum culture, urine for strep antigen and Legionella. Supportive care with Tylenol and antitussives.  Elevated troponin History EKG changes. Patient does not have any chest pain symptoms. Continue baby aspirin. We'll cycle enzymes and obtain a 2-D echo to rule out wall motion abnormality. The etiology consult if progressive elevation in troponin noted.  Hyponatremia Mild. Likely secondary to pneumonia and use of HCTZ. Patient and daughter refuse to have her diovan HCT be held.  Carotid artery disease Continue Vytorin and aspirin. Follows with Dr. Ellyn Hack as outpatient   Dyslipidemia Continue Vytorin and niacin.  Generalized weakness Will obtain PT evaluation  ? Left lung nodule on CT angiogram chest  Recommend follow-up chest CT without contrast in 3 months   DVT prophylaxis:lovenox  Diet: Heart healthy  Code Status: DO NOT RESUSCITATE Family Communication: Daughter at bedside Disposition Plan: Home possibly tomorrow if clinically improved   Consultants:  None  Procedures:  CT angiogram of the chest  Antibiotics:  IV Rocephin and  azithromycin since 4/24  HPI/Subjective: Patient seen and examined. Reports having productive phlegm today. Very unhappy overall. Denies any pain.  Objective: Filed Vitals:   07/01/14 1328  BP: 154/56  Pulse: 81  Temp: 99.3 F (37.4 C)  Resp: 20    Intake/Output Summary (Last 24 hours) at 07/01/14 1515 Last data filed at 07/01/14 1356  Gross per 24 hour  Intake    895 ml  Output   1000 ml  Net   -105 ml   Filed Weights   06/30/14 1011 06/30/14 1818 07/01/14 0538  Weight: 68.493 kg (151 lb) 69.31 kg (152 lb 12.8 oz) 69.5 kg (153 lb 3.5 oz)    Exam:   General:  Elderly female in no acute distress,   HEENT: No pallor, moist oral mucosa, no JVD, supple neck  Cardiovascular: Normal S1 and S2, no murmurs rub or gallop  Respiratory: Left basilar crackles, no rhonchi or wheeze  GI: Soft, nondistended, nontender, bowel sounds present  Musculoskeletal: Warm, no edema  CNS: Alert and oriented  Data Reviewed: Basic Metabolic Panel:  Recent Labs Lab 06/30/14 1030 07/01/14 0643  NA 133* 133*  K 3.7 3.5  CL 100 98  CO2 26 25  GLUCOSE 126* 111*  BUN 9 7  CREATININE 0.74 0.85  CALCIUM 9.4 8.6   Liver Function Tests:  Recent Labs Lab 07/01/14 0643  AST 22  ALT 13  ALKPHOS 33*  BILITOT 0.6  PROT 6.1  ALBUMIN 2.8*   No results for input(s): LIPASE, AMYLASE in the last 168 hours. No results for input(s): AMMONIA in the last 168 hours. CBC:  Recent Labs Lab 06/30/14 1030 07/01/14 0643  WBC 8.3 5.9  NEUTROABS 5.9 3.3  HGB 14.0 12.4  HCT 41.7 37.7  MCV 88.7 89.1  PLT 260 221   Cardiac Enzymes:  Recent Labs Lab 06/30/14 1030 07/01/14 0643  TROPONINI 0.18* 0.18*   BNP (last 3 results)  Recent Labs  06/30/14 1030  BNP 133.7*    ProBNP (last 3 results) No results for input(s): PROBNP in the last 8760 hours.  CBG: No results for input(s): GLUCAP in the last 168 hours.  Recent Results (from the past 240 hour(s))  Culture, blood (routine  x 2)     Status: None (Preliminary result)   Collection Time: 06/30/14  3:00 PM  Result Value Ref Range Status   Specimen Description BLOOD LEFT ANTECUBITAL  Final   Special Requests NONE  Final   Culture   Final           BLOOD CULTURE RECEIVED NO GROWTH TO DATE CULTURE WILL BE HELD FOR 5 DAYS BEFORE ISSUING A FINAL NEGATIVE REPORT Performed at [facility]    Report Status PENDING  Incomplete  Culture, blood (routine x 2)     Status: None (Preliminary result)   Collection Time: 06/30/14  3:10 PM  Result Value Ref Range Status   Specimen Description BLOOD  Final   Special Requests NONE  Final   Culture   Final           BLOOD CULTURE RECEIVED NO GROWTH TO DATE CULTURE WILL BE HELD FOR 5 DAYS BEFORE ISSUING A FINAL NEGATIVE REPORT Performed at [facility]    Report Status PENDING  Incomplete     Studies: Dg Chest 2 View  06/30/2014   CLINICAL DATA:  Acute onset generalized weakness approximately 12 hr ago. Current history of hypertension and bladder cancer.  EXAM: CHEST  2 VIEW  COMPARISON:  11/17/2009, 12/29/2007.  FINDINGS: Suboptimal inspiration accounts for mild atelectasis in the lung bases. Cardiac silhouette normal in size, unchanged. Mildly prominent bronchovascular markings diffusely and central peribronchial thickening, unchanged. Lungs otherwise clear. No pleural effusions. No pneumothorax. Degenerative changes and DISH involving the thoracic spine.  IMPRESSION: Suboptimal inspiration accounts for mild bibasilar atelectasis. No acute cardiopulmonary disease otherwise. Stable mild changes of chronic bronchitis and/or asthma.   Electronically Signed   By: Evangeline Dakin M.D.   On: 06/30/2014 11:50   Ct Angio Chest Pe W/cm &/or Wo Cm  06/30/2014   CLINICAL DATA:  Shortness of breath, dry cough since yesterday, weakness and fatigue.  EXAM: CT ANGIOGRAPHY CHEST WITH CONTRAST  TECHNIQUE: Multidetector CT imaging of the chest was performed using the standard protocol  during bolus administration of intravenous contrast. Multiplanar CT image reconstructions and MIPs were obtained to evaluate the vascular anatomy.  CONTRAST:  187mL OMNIPAQUE IOHEXOL 350 MG/ML SOLN  COMPARISON:  Chest radiograph 06/30/2014  FINDINGS: Mediastinum/Nodes: Heart size is normal. No pericardial effusion. No lymphadenopathy. Moderate atheromatous aortic calcification and coronary arterial calcification.  The examination is adequate for evaluation for acute pulmonary embolism up to and including the 3rd order pulmonary arteries.  Lungs/Pleura: Emphysematous changes are identified. There is a patchy irregular area of ground-glass airspace opacity right upper lobe 2.1 x 0.9 cm with internal air bronchogram formation. No other measurable nodule or mass is identified. No other consolidation. No pleural effusion.  Upper abdomen: Normal  Musculoskeletal: Multilevel disc degenerative change identified.  Review of the MIP images confirms the above findings.  IMPRESSION: No CT evidence for acute pulmonary embolism.  2.1 cm right upper lobe patchy area of ground-glass airspace opacity with internal air bronchogram formation. Given the history provided,  this could represent infection but low-grade adenocarcinoma could appear similar. Initial follow-up by chest CT without contrast is recommended in 3 months to confirm persistence. This recommendation follows the consensus statement: Recommendations for the Management of Subsolid Pulmonary Nodules Detected at CT: A Statement from the [place] as published in Radiology 2013; 266:304-317.   Electronically Signed   By: Conchita Paris M.D.   On: 06/30/2014 13:59    Scheduled Meds: . aspirin EC  81 mg Oral Daily  . azithromycin  500 mg Intravenous Q24H  . cefTRIAXone (ROCEPHIN)  IV  1 g Intravenous Q24H  . ezetimibe-simvastatin  1 tablet Oral Q breakfast  . hydrochlorothiazide  12.5 mg Oral Daily  . ipratropium  2 spray Each Nare BID  . latanoprost  1  drop Both Eyes QHS  . loratadine  10 mg Oral Daily  . niacin  500 mg Oral Daily  . sodium chloride  3 mL Intravenous Q12H  . timolol  1 drop Both Eyes BID  . valsartan  320 mg Oral Daily   Continuous Infusions:    Time spent: 25 minutes    Merna Baldi, [place]  [facility] Pager [phone]. If 7PM-7AM, please contact night-coverage at [email or web address], password [facility] 07/01/2014, 3:15 PM  LOS: 1 day

## 2014-07-01 NOTE — Progress Notes (Signed)
Pt. daughter at bedside and wants to speak with MD, has lots of questions about plan of care & new medications as well as old medication.  Text page Dr. Clementeen Graham and informed of above also letting him know that she has to leave by 10am wants to talk face to face. Will continue to monitor and await for return call.  Alphonzo Lemmings, RN

## 2014-07-01 NOTE — Progress Notes (Signed)
Offered Pt a bath, she refused. Will report to on coming tech.

## 2014-07-01 NOTE — Progress Notes (Signed)
  Echocardiogram 2D Echocardiogram has been performed.  Stacey Walker Stacey Walker 07/01/2014, 4:50 PM

## 2014-07-02 DIAGNOSIS — I9589 Other hypotension: Secondary | ICD-10-CM

## 2014-07-02 MED ORDER — DOCUSATE SODIUM 100 MG PO CAPS
200.0000 mg | ORAL_CAPSULE | Freq: Every evening | ORAL | Status: DC | PRN
  Administered 2014-07-02: 200 mg via ORAL
  Filled 2014-07-02: qty 2

## 2014-07-02 MED ORDER — ENOXAPARIN SODIUM 40 MG/0.4ML ~~LOC~~ SOLN
40.0000 mg | SUBCUTANEOUS | Status: DC
  Administered 2014-07-02 – 2014-07-03 (×2): 40 mg via SUBCUTANEOUS
  Filled 2014-07-02 (×2): qty 0.4

## 2014-07-02 NOTE — Progress Notes (Signed)
[facility] PROGRESS NOTE  Stacey Walker UYQ:[id number] DOB: 12-08-1925 DOA: 06/30/2014 PCP: Donnajean Lopes, MD  Brief narrative 81 year old female with history of hypertension, hyperlipidemia, carotid  artery disease who presented to [facility] on 4/24 with generalized weakness, dry cough with subjective fever and shortness of breath for 1 day duration. Chest x-ray was unremarkable and a CT angiogram of the chest done which was negative for PE but showed left upper lobe infiltrate. She also had a fever of 101.59F in the ED. During workup she was found to have elevated troponin of 0.18 and nonspecific T-wave changes in lateral leads. Patient admitted to [facility] for community-acquired pneumonia.   Assessment/Plan: Community acquired pneumonia  empiric Rocephin and azithromycin. Blood cultures, urine for strep antigen and Legionella negative. Supportive care with Tylenol and antitussives. Still having productive cough but feels better.  Elevated troponin No specific EKG changes. Patient does not have any chest pain symptoms. Continue baby aspirin. Troponin peaked at 0.20. 2-D echo showed EF of 35 and 60% with grade 1 diastolic dysfunction.  Hyponatremia Mild. Likely secondary to pneumonia and use of HCTZ. Held blood pressure medication given low BP.  Carotid artery disease Continue Vytorin and aspirin. Follows with Dr. Ellyn Hack as outpatient   Dyslipidemia Continue Vytorin and niacin.  Generalized weakness Will obtain PT evaluation  ? Left lung nodule on CT angiogram chest  Recommend follow-up chest CT without contrast in 3 months   DVT prophylaxis:lovenox  Diet: Heart healthy  Code Status: DO NOT RESUSCITATE Family Communication: None at bedside. Spoke with daughter on 4/25 Disposition Plan: SNF per PT. possibly tomorrow   Consultants:  None  Procedures:  CT angiogram of the chest  Antibiotics:  IV Rocephin and azithromycin since  4/24  HPI/Subjective: Patient seen and examined. Reports having productive phlegm today. Very unhappy overall. Denies any pain.  Objective: Filed Vitals:   07/02/14 1312  BP: 160/61  Pulse: 68  Temp: 98.8 F (37.1 C)  Resp: 18    Intake/Output Summary (Last 24 hours) at 07/02/14 1630 Last data filed at 07/02/14 1506  Gross per 24 hour  Intake    840 ml  Output    900 ml  Net    -60 ml   Filed Weights   06/30/14 1818 07/01/14 0538 07/02/14 0721  Weight: 69.31 kg (152 lb 12.8 oz) 69.5 kg (153 lb 3.5 oz) 71.85 kg (158 lb 6.4 oz)    Exam:   General:  Elderly female in no acute distress,   HEENT: No pallor, moist oral mucosa, no JVD, supple neck  Cardiovascular: Normal S1 and S2, no murmurs rub or gallop  Respiratory: Left basilar crackles, no rhonchi or wheeze  GI: Soft, nondistended, nontender, bowel sounds present  Musculoskeletal: Warm, no edema  CNS: Alert and oriented  Data Reviewed: Basic Metabolic Panel:  Recent Labs Lab 06/30/14 1030 07/01/14 0643  NA 133* 133*  K 3.7 3.5  CL 100 98  CO2 26 25  GLUCOSE 126* 111*  BUN 9 7  CREATININE 0.74 0.85  CALCIUM 9.4 8.6   Liver Function Tests:  Recent Labs Lab 07/01/14 0643  AST 22  ALT 13  ALKPHOS 33*  BILITOT 0.6  PROT 6.1  ALBUMIN 2.8*   No results for input(s): LIPASE, AMYLASE in the last 168 hours. No results for input(s): AMMONIA in the last 168 hours. CBC:  Recent Labs Lab 06/30/14 1030 07/01/14 0643  WBC 8.3 5.9  NEUTROABS 5.9 3.3  HGB 14.0 12.4  HCT 41.7 37.7  MCV 88.7 89.1  PLT 260 221   Cardiac Enzymes:  Recent Labs Lab 06/30/14 1030 07/01/14 0643 07/01/14 1631  TROPONINI 0.18* 0.18* 0.20*   BNP (last 3 results)  Recent Labs  06/30/14 1030  BNP 133.7*    ProBNP (last 3 results) No results for input(s): PROBNP in the last 8760 hours.  CBG: No results for input(s): GLUCAP in the last 168 hours.  Recent Results (from the past 240 hour(s))  Culture, blood  (routine x 2)     Status: None (Preliminary result)   Collection Time: 06/30/14  3:00 PM  Result Value Ref Range Status   Specimen Description BLOOD LEFT ANTECUBITAL  Final   Special Requests NONE  Final   Culture   Final           BLOOD CULTURE RECEIVED NO GROWTH TO DATE CULTURE WILL BE HELD FOR 5 DAYS BEFORE ISSUING A FINAL NEGATIVE REPORT Performed at [facility]    Report Status PENDING  Incomplete  Culture, blood (routine x 2)     Status: None (Preliminary result)   Collection Time: 06/30/14  3:10 PM  Result Value Ref Range Status   Specimen Description BLOOD  Final   Special Requests NONE  Final   Culture   Final           BLOOD CULTURE RECEIVED NO GROWTH TO DATE CULTURE WILL BE HELD FOR 5 DAYS BEFORE ISSUING A FINAL NEGATIVE REPORT Performed at [facility]    Report Status PENDING  Incomplete     Studies: No results found.  Scheduled Meds: . aspirin EC  81 mg Oral Daily  . azithromycin  500 mg Intravenous Q24H  . cefTRIAXone (ROCEPHIN)  IV  1 g Intravenous Q24H  . enoxaparin (LOVENOX) injection  40 mg Subcutaneous Q24H  . ezetimibe-simvastatin  1 tablet Oral Q breakfast  . ipratropium  2 spray Each Nare BID  . latanoprost  1 drop Both Eyes QHS  . loratadine  10 mg Oral Daily  . niacin  500 mg Oral Daily  . sodium chloride  3 mL Intravenous Q12H  . timolol  1 drop Both Eyes BID   Continuous Infusions:    Time spent: 25 minutes    Kashus Karlen, [place]  [facility] Pager [phone]. If 7PM-7AM, please contact night-coverage at [email or web address], password [facility] 07/02/2014, 4:30 PM  LOS: 2 days

## 2014-07-02 NOTE — Clinical Social Work Placement (Signed)
   CLINICAL SOCIAL WORK PLACEMENT  NOTE  Date:  07/02/2014  Patient Details  Name: LYNNE TAKEMOTO MRN: [id number] Date of Birth: March 31, 1925  Clinical Social Work is seeking post-discharge placement for this patient at the [place] level of care (*CSW will initial, date and re-position this form in  chart as items are completed):  Yes   Patient/family provided with [facility] Work Department's list of facilities offering this level of care within the geographic area requested by the patient (or if unable, by the patient's family).  Yes   Patient/family informed of their freedom to choose among providers that offer the needed level of care, that participate in Medicare, Medicaid or managed care program needed by the patient, have an available bed and are willing to accept the patient.  Yes   Patient/family informed of [facility]'s ownership interest in [facility] and [facility], as well as of the fact that they are under no obligation to receive care at these facilities.  PASRR submitted to EDS on 07/02/14     PASRR number received on 07/02/14     Existing PASRR number confirmed on       FL2 transmitted to all facilities in geographic area requested by pt/family on 07/02/14     FL2 transmitted to all facilities within larger geographic area on       Patient informed that his/her managed care company has contracts with or will negotiate with certain facilities, including the following:            Patient/family informed of bed offers received.  Patient chooses bed at       Physician recommends and patient chooses bed at      Patient to be transferred to   on  .  Patient to be transferred to facility by       Patient family notified on   of transfer.  Name of family member notified:        PHYSICIAN       Additional Comment:  Patient and patient daughter verbalize preference to [place] and [place]  _______________________________________________ Barbette Or, [place]

## 2014-07-02 NOTE — Clinical Social Work Note (Signed)
Clinical Social Work Assessment  Patient Details  Name: Stacey Walker MRN: [id number] Date of Birth: 23-May-1925  Date of referral:  07/02/14               Reason for consult:  Discharge Planning                Permission sought to share information with:  Family Supports Permission granted to share information::  Yes, Verbal Permission Granted  Name::     Romie Jumper [phone]   Housing/Transportation Living arrangements for the past 2 months:  [place] [facility]) Source of Information:  Patient, Adult Children Patient Interpreter Needed:  None Criminal Activity/Legal Involvement Pertinent to Current Situation/Hospitalization:  No - Comment as needed Significant Relationships:  Adult Children Lives with:  Self Do you feel safe going back to the place where you live?  Yes Need for family participation in patient care:  Yes (Comment)  Care giving concerns:  Patient daughter at bedside and verbalizes concerns regarding patient need for discharge tomorrow and lack of information regarding SNF placement - CSW answered questions and will initiate referral.  Patient daughter also concerned that patient will only thrive in certain environments and is selective with facility choices.   Social Worker assessment / plan:  Holiday representative met with patient and patient daughter at bedside to offer support and discuss patient needs at discharge.  Patient and patient daughter both agreeable with SNF placement in [facility] with preference to [facility] and [facility].  CSW to follow up with patient and family to provide bed offers and facilitate patient discharge needs once medically stable.  Employment status:  Retired Forensic scientist:  [employer]) PT Recommendations:  [place] / Referral to community resources:  [place]  Patient/Family's Response to care:  Patient and patient  daughter agreeable with current plan and are realistic regarding patient inability to return home independently at this time.  Patient and family feel confident that patient will progress with therapies at SNF and be able to transition back home independently.  Patient/Family's Understanding of and Emotional Response to Diagnosis, Current Treatment, and Prognosis:  Patient and patient daughter understanding of patient current condition.  Per patient daughter, patient PCP was able to come and visit with patient and recommend [place] - patient and family with good rapport and trust this recommendation for placement.  Patient verbalizes her appreciation for input and involvement.  Emotional Assessment Appearance:  Appears younger than stated age Attitude/Demeanor/Rapport:    Affect (typically observed):  Accepting, Appropriate, Blunt Orientation:  Oriented to Self, Oriented to Place, Oriented to  Time, Oriented to Situation Alcohol / Substance use:  Not Applicable Psych involvement (Current and /or in the community):  No (Comment)  Discharge Needs  Concerns to be addressed:  Discharge Planning Concerns Readmission within the last 30 days:  No Current discharge risk:  Lives alone, Dependent with Mobility Barriers to Discharge:  Continued Medical Work up, Ship broker, Unsafe home situation   [place], [place]

## 2014-07-02 NOTE — Care Management Note (Unsigned)
    Page 1 of 1   07/02/2014     12:44:57 PM CARE MANAGEMENT NOTE 07/02/2014  Patient:  Stacey Walker, Stacey Walker   Account Number:  [id number]  Date Initiated:  07/02/2014  Documentation initiated by:  Tomi Bamberger  Subjective/Objective Assessment:   dx cap  admit- lives alone     Action/Plan:   pt eval-snf   Anticipated DC Date:  07/03/2014   Anticipated DC Plan:  SKILLED NURSING FACILITY  In-house referral  Clinical Social Worker      DC Planning Services  CM consult      Choice offered to / List presented to:             Status of service:  In process, will continue to follow Medicare Important Message given?  YES (If response is "NO", the following Medicare IM given date fields will be blank) Date Medicare IM given:  07/02/2014 Medicare IM given by:  Tomi Bamberger Date Additional Medicare IM given:   Additional Medicare IM given by:    Discharge Disposition:    Per UR Regulation:    If discussed at Long Length of Stay Meetings, dates discussed:    Comments:  07/02/14 [place] ,BSN [phone] patient lives alone, up in chair, per phsycal therapy rec snf,CSW referral.

## 2014-07-02 NOTE — Progress Notes (Signed)
Utilization review completed.  

## 2014-07-02 NOTE — Evaluation (Signed)
Physical Therapy Evaluation Patient Details Name: Stacey Walker MRN: [id number] DOB: Aug 01, 1925 Today's Date: 07/02/2014   History of Present Illness  Patient is an 81 y/o female admitted with htn, hyperlipidemia c/o inability to walk.  Found to have left upper lobe infiltrate consistent with CAP and elevated troponin.  Clinical Impression  Patient presents with decreased independence with mobility due to deficits listed in PT problem list.  She will benefit from skilled PT in the acute setting to allow return home following SNF rehab stay.    Follow Up Recommendations SNF    Equipment Recommendations  None recommended by PT    Recommendations for Other Services       Precautions / Restrictions Precautions Precautions: Fall Restrictions Weight Bearing Restrictions: No      Mobility  Bed Mobility Overal bed mobility: Needs Assistance Bed Mobility: Supine to Sit     Supine to sit: Min assist     General bed mobility comments: pt pulled up on PT to come to sit  Transfers Overall transfer level: Needs assistance Equipment used: Rolling walker (2 wheeled) Transfers: Sit to/from Stand Sit to Stand: Min assist         General transfer comment: lifting assist with initial posterior bias  Ambulation/Gait Ambulation/Gait assistance: Min assist Ambulation Distance (Feet): 50 Feet Assistive device: Rolling walker (2 wheeled) Gait Pattern/deviations: Step-to pattern;Shuffle;Decreased stride length;Festinating;Leaning posteriorly     General Gait Details: at times leans posterior, mostly noting Parkinson-like pattern with short suffling steps and episodes of festinating with reports of feeling unsteady  Stairs            Wheelchair Mobility    Modified Rankin (Stroke Patients Only)       Balance Overall balance assessment: Needs assistance       Postural control: Posterior lean Standing balance support: Bilateral upper extremity supported Standing  balance-Leahy Scale: Poor Standing balance comment: needs assist with walker support due to posterior bias                             Pertinent Vitals/Pain Pain Assessment: No/denies pain    Home Living Family/patient expects to be discharged to:: Private residence Living Arrangements: Alone   Type of Home: Apartment Home Access: Level entry     Home Layout: One level Home Equipment: Walker - 4 wheels;Shower seat;Grab bars - tub/shower;Grab bars - toilet      Prior Function Level of Independence: Independent with assistive device(s)         Comments: walks to dining facility with walker, no device in apartment     Hand Dominance        Extremity/Trunk Assessment               Lower Extremity Assessment: RLE deficits/detail;LLE deficits/detail RLE Deficits / Details: hip flexion 3/5, knee extension 4/5 ankle DF 4/5 LLE Deficits / Details: hip flexion 3/5, knee extension 4/5 ankle DF 4/5  Cervical / Trunk Assessment: Kyphotic  Communication   Communication: No difficulties  Cognition Arousal/Alertness: Awake/alert Behavior During Therapy: Flat affect Overall Cognitive Status: No family/caregiver present to determine baseline cognitive functioning                      General Comments      Exercises        Assessment/Plan    PT Assessment Patient needs continued PT services  PT Diagnosis Abnormality of gait;Generalized weakness   PT  Problem List Decreased strength;Decreased mobility;Decreased activity tolerance;Decreased balance;Decreased knowledge of use of DME;Decreased coordination;Decreased cognition  PT Treatment Interventions DME instruction;Therapeutic exercise;Gait training;Balance training;Functional mobility training;Therapeutic activities;Patient/family education;Neuromuscular re-education   PT Goals (Current goals can be found in the Care Plan section) Acute Rehab PT Goals Patient Stated Goal: To get stronger prior to  d/c home PT Goal Formulation: With patient Time For Goal Achievement: 07/16/14 Potential to Achieve Goals: Good    Frequency Min 3X/week   Barriers to discharge Decreased caregiver support      Co-evaluation               End of Session Equipment Utilized During Treatment: Gait belt Activity Tolerance: Patient limited by fatigue Patient left: in chair;with call bell/phone within reach           Time: 0950-1011 PT Time Calculation (min) (ACUTE ONLY): 21 min   Charges:   PT Evaluation $Initial PT Evaluation Tier I: 1 Procedure     PT G Codes:        Walker,Stacey 07-28-14, 10:30 AM  Magda Walker, Stacey [place] 28-Jul-2014

## 2014-07-03 DIAGNOSIS — R911 Solitary pulmonary nodule: Secondary | ICD-10-CM

## 2014-07-03 MED ORDER — CEFUROXIME AXETIL 250 MG PO TABS: 250.0000 mg | ORAL_TABLET | Freq: Two times a day (BID) | ORAL | Status: DC

## 2014-07-03 MED ORDER — SACCHAROMYCES BOULARDII 250 MG PO CAPS: 250.0000 mg | ORAL_CAPSULE | Freq: Two times a day (BID) | ORAL | Status: DC

## 2014-07-03 MED ORDER — AMOXICILLIN-POT CLAVULANATE 600-42.9 MG/5ML PO SUSR: 600.0000 mg | Freq: Two times a day (BID) | ORAL | Status: DC

## 2014-07-03 MED ORDER — DOCUSATE SODIUM 100 MG PO CAPS: 200.0000 mg | ORAL_CAPSULE | Freq: Every evening | ORAL | Status: DC | PRN

## 2014-07-03 MED ORDER — POLYETHYLENE GLYCOL 3350 17 G PO PACK
17.0000 g | PACK | Freq: Every day | ORAL | Status: DC
  Administered 2014-07-03: 17 g via ORAL
  Filled 2014-07-03: qty 1

## 2014-07-03 MED ORDER — ACETAMINOPHEN 325 MG PO TABS: 650.0000 mg | ORAL_TABLET | Freq: Four times a day (QID) | ORAL | Status: DC | PRN

## 2014-07-03 MED ORDER — POLYETHYLENE GLYCOL 3350 17 G PO PACK: 17.0000 g | PACK | Freq: Every day | ORAL | Status: DC

## 2014-07-03 MED ORDER — AZITHROMYCIN 250 MG PO TABS: ORAL_TABLET | ORAL | Status: DC

## 2014-07-03 NOTE — Clinical Social Work Placement (Signed)
   CLINICAL SOCIAL WORK PLACEMENT  NOTE  Date:  07/03/2014  Patient Details  Name: Stacey Walker MRN: [id number] Date of Birth: January 18, 1926  Clinical Social Work is seeking post-discharge placement for this patient at the [place] level of care (*CSW will initial, date and re-position this form in  chart as items are completed):  Yes   Patient/family provided with [place] Work Department's list of facilities offering this level of care within the geographic area requested by the patient (or if unable, by the patient's family).  Yes   Patient/family informed of their freedom to choose among providers that offer the needed level of care, that participate in Medicare, Medicaid or managed care program needed by the patient, have an available bed and are willing to accept the patient.  Yes   Patient/family informed of [facility]'s ownership interest in [facility] and [facility], as well as of the fact that they are under no obligation to receive care at these facilities.  PASRR submitted to EDS on 07/03/14     PASRR number received on 07/03/14     Existing PASRR number confirmed on       FL2 transmitted to all facilities in geographic area requested by pt/family on 07/03/14     FL2 transmitted to all facilities within larger geographic area on       Patient informed that his/her managed care company has contracts with or will negotiate with certain facilities, including the following:        Yes   Patient/family informed of bed offers received.  Patient chooses bed at [facility]     Physician recommends and patient chooses bed at      Patient to be transferred to [facility] on 07/03/14.  Patient to be transferred to facility by Ambulance     Patient family notified on 07/03/14 of transfer.  Name of family member notified:  Romie Jumper     PHYSICIAN       Additional Comment:   Per MD patient ready for DC to [facility]. RN,  patient, patient's family, and facility notified of DC. RN given number for report. DC packet on chart. Ambulance transport requested for patient for 4:00PM, per RN's request. CSW signing off.  _______________________________________________ Rigoberto Noel, LCSW 07/03/2014, 3:10 PM

## 2014-07-03 NOTE — Discharge Summary (Addendum)
Physician Discharge Summary  Stacey Walker LNL:[id number] DOB: January 07, 1926 DOA: 06/30/2014  PCP: Donnajean Lopes, MD  Admit date: 06/30/2014 Discharge date: 07/03/2014  Time spent: 20 minutes  Recommendations for Outpatient Follow-up:  1. Follow up with PCP in 1-2 weeks 2. Recommend SLP follow up within 1 week 3. Recommend outpatient Neurology follow up for decreased fine motor function 4. Recommend follow-up chest CT without contrast in 3 months 5. Please keep head of bed elevated at least 30% to prevent aspiration  Discharge Diagnoses:  Active Problems:   CAP (community acquired pneumonia)   Lung nodule   Elevated troponin   Pneumonia   Discharge Condition: Improved  Diet recommendation: regular, thin liquid  Filed Weights   07/01/14 0538 07/02/14 0721 07/03/14 0540  Weight: 69.5 kg (153 lb 3.5 oz) 71.85 kg (158 lb 6.4 oz) 72.3 kg (159 lb 6.3 oz)    History of present illness:  Please see admit h and p from 4/24 for details. Briefly, pt presented with cough, fevers, and sob, found to have PNA. The patient was admitted for further work up.  Hospital Course:  Community acquired pneumonia Initially continued on empiric Rocephin and azithromycin.  Pt remained afebrile through the remainder of the course without leukocytosis The patient will complete course of PO augmentin with ceftin on discharge  Elevated troponin History EKG changes. Patient does not have any chest pain symptoms. Continued baby aspirin.  2D echo without regional wall motion abnormalities No ischemic changes on EKG  Hyponatremia Likely secondary to pneumonia and use of HCTZ.  Patient and daughter refuse to have her diovan HCT be held. Remained stable  Carotid artery disease Continued Vytorin and aspirin. Follows with Dr. Ellyn Hack as outpatient  Dyslipidemia Continue Vytorin and niacin.  Generalized weakness Will obtain PT evaluation  ? Left lung nodule on CT angiogram chest  Recommend  follow-up chest CT without contrast in 3 months   DVT prophylaxis:lovenox while inpatient   Discharge Exam: Filed Vitals:   07/02/14 1312 07/02/14 2111 07/03/14 0540 07/03/14 1524  BP: 160/61 130/37 120/63 155/56  Pulse: 68 80 84 72  Temp: 98.8 F (37.1 C) 99.9 F (37.7 C) 99.9 F (37.7 C) 99.9 F (37.7 C)  TempSrc: Oral Oral Oral Oral  Resp: 18 17 17 16   Height:      Weight:   72.3 kg (159 lb 6.3 oz)   SpO2: 94% 93% 93% 95%    General: Awake, in nad Cardiovascular: regular, s1, s2 Respiratory: normal resp effort, no wheezing  Discharge Instructions     Medication List    TAKE these medications        acetaminophen 325 MG tablet  Commonly known as:  TYLENOL  Take 2 tablets (650 mg total) by mouth every 6 (six) hours as needed for mild pain or fever (or Fever >/= 101).     amoxicillin-clavulanate 600-42.9 MG/5ML suspension  Commonly known as:  AUGMENTIN  Take 5 mLs (600 mg total) by mouth 2 (two) times daily.     aspirin EC 81 MG tablet  Take 81 mg by mouth daily.     cetirizine 10 MG tablet  Commonly known as:  ZYRTEC  Take 10 mg by mouth daily.     docusate sodium 100 MG capsule  Commonly known as:  COLACE  Take 2 capsules (200 mg total) by mouth at bedtime as needed for mild constipation.     ezetimibe-simvastatin 10-40 MG per tablet  Commonly known as:  VYTORIN  Take 1  tablet by mouth at bedtime.     ipratropium 0.03 % nasal spray  Commonly known as:  ATROVENT  Place 1 spray into both nostrils 3 (three) times daily.     LUMIGAN 0.01 % Soln  Generic drug:  bimatoprost  Place 1 drop into both eyes at bedtime.     multivitamin tablet  Take 1 tablet by mouth daily.     niacin 500 MG tablet  Commonly known as:  SLO-NIACIN  Take 500 mg by mouth daily.     Omega-3 Krill Oil 300 MG Caps  Take 300 mg by mouth daily.     polyethylene glycol packet  Commonly known as:  MIRALAX / GLYCOLAX  Take 17 g by mouth daily.     saccharomyces boulardii 250  MG capsule  Commonly known as:  FLORASTOR  Take 1 capsule (250 mg total) by mouth 2 (two) times daily.     timolol 0.5 % ophthalmic solution  Commonly known as:  BETIMOL  Place 1 drop into both eyes daily.     valsartan-hydrochlorothiazide 320-12.5 MG per tablet  Commonly known as:  DIOVAN-HCT  Take 1 tablet by mouth daily.       No Known Allergies Follow-up Information    Follow up with Donnajean Lopes, MD. Schedule an appointment as soon as possible for a visit in 1 week.   Specialty:  Internal Medicine   Why:  Hospital follow up   Contact information:   [address] [place] [address] [phone]        The results of significant diagnostics from this hospitalization (including imaging, microbiology, ancillary and laboratory) are listed below for reference.    Significant Diagnostic Studies: Dg Chest 2 View  06/30/2014   CLINICAL DATA:  Acute onset generalized weakness approximately 12 hr ago. Current history of hypertension and bladder cancer.  EXAM: CHEST  2 VIEW  COMPARISON:  11/17/2009, 12/29/2007.  FINDINGS: Suboptimal inspiration accounts for mild atelectasis in the lung bases. Cardiac silhouette normal in size, unchanged. Mildly prominent bronchovascular markings diffusely and central peribronchial thickening, unchanged. Lungs otherwise clear. No pleural effusions. No pneumothorax. Degenerative changes and DISH involving the thoracic spine.  IMPRESSION: Suboptimal inspiration accounts for mild bibasilar atelectasis. No acute cardiopulmonary disease otherwise. Stable mild changes of chronic bronchitis and/or asthma.   Electronically Signed   By: Evangeline Dakin M.D.   On: 06/30/2014 11:50   Ct Angio Chest Pe W/cm &/or Wo Cm  06/30/2014   CLINICAL DATA:  Shortness of breath, dry cough since yesterday, weakness and fatigue.  EXAM: CT ANGIOGRAPHY CHEST WITH CONTRAST  TECHNIQUE: Multidetector CT imaging of the chest was performed using the standard protocol during  bolus administration of intravenous contrast. Multiplanar CT image reconstructions and MIPs were obtained to evaluate the vascular anatomy.  CONTRAST:  164mL OMNIPAQUE IOHEXOL 350 MG/ML SOLN  COMPARISON:  Chest radiograph 06/30/2014  FINDINGS: Mediastinum/Nodes: Heart size is normal. No pericardial effusion. No lymphadenopathy. Moderate atheromatous aortic calcification and coronary arterial calcification.  The examination is adequate for evaluation for acute pulmonary embolism up to and including the 3rd order pulmonary arteries.  Lungs/Pleura: Emphysematous changes are identified. There is a patchy irregular area of ground-glass airspace opacity right upper lobe 2.1 x 0.9 cm with internal air bronchogram formation. No other measurable nodule or mass is identified. No other consolidation. No pleural effusion.  Upper abdomen: Normal  Musculoskeletal: Multilevel disc degenerative change identified.  Review of the MIP images confirms the above findings.  IMPRESSION: No CT  evidence for acute pulmonary embolism.  2.1 cm right upper lobe patchy area of ground-glass airspace opacity with internal air bronchogram formation. Given the history provided, this could represent infection but low-grade adenocarcinoma could appear similar. Initial follow-up by chest CT without contrast is recommended in 3 months to confirm persistence. This recommendation follows the consensus statement: Recommendations for the Management of Subsolid Pulmonary Nodules Detected at CT: A Statement from the [place] as published in Radiology 2013; 266:304-317.   Electronically Signed   By: Conchita Paris M.D.   On: 06/30/2014 13:59    Microbiology: Recent Results (from the past 240 hour(s))  Culture, blood (routine x 2)     Status: None (Preliminary result)   Collection Time: 06/30/14  3:00 PM  Result Value Ref Range Status   Specimen Description BLOOD LEFT ANTECUBITAL  Final   Special Requests NONE  Final   Culture   Final            BLOOD CULTURE RECEIVED NO GROWTH TO DATE CULTURE WILL BE HELD FOR 5 DAYS BEFORE ISSUING A FINAL NEGATIVE REPORT Performed at [facility]    Report Status PENDING  Incomplete  Culture, blood (routine x 2)     Status: None (Preliminary result)   Collection Time: 06/30/14  3:10 PM  Result Value Ref Range Status   Specimen Description BLOOD  Final   Special Requests NONE  Final   Culture   Final           BLOOD CULTURE RECEIVED NO GROWTH TO DATE CULTURE WILL BE HELD FOR 5 DAYS BEFORE ISSUING A FINAL NEGATIVE REPORT Performed at [facility]    Report Status PENDING  Incomplete     Labs: Basic Metabolic Panel:  Recent Labs Lab 06/30/14 1030 07/01/14 0643  NA 133* 133*  K 3.7 3.5  CL 100 98  CO2 26 25  GLUCOSE 126* 111*  BUN 9 7  CREATININE 0.74 0.85  CALCIUM 9.4 8.6   Liver Function Tests:  Recent Labs Lab 07/01/14 0643  AST 22  ALT 13  ALKPHOS 33*  BILITOT 0.6  PROT 6.1  ALBUMIN 2.8*   No results for input(s): LIPASE, AMYLASE in the last 168 hours. No results for input(s): AMMONIA in the last 168 hours. CBC:  Recent Labs Lab 06/30/14 1030 07/01/14 0643  WBC 8.3 5.9  NEUTROABS 5.9 3.3  HGB 14.0 12.4  HCT 41.7 37.7  MCV 88.7 89.1  PLT 260 221   Cardiac Enzymes:  Recent Labs Lab 06/30/14 1030 07/01/14 0643 07/01/14 1631  TROPONINI 0.18* 0.18* 0.20*   BNP: BNP (last 3 results)  Recent Labs  06/30/14 1030  BNP 133.7*    ProBNP (last 3 results) No results for input(s): PROBNP in the last 8760 hours.  CBG: No results for input(s): GLUCAP in the last 168 hours.   Signed:  Dawna Jakes K  [facility] 07/03/2014, 4:10 PM

## 2014-07-03 NOTE — Clinical Social Work Placement (Signed)
   CLINICAL SOCIAL WORK PLACEMENT  NOTE  Date:  07/03/2014  Patient Details  Name: Stacey Walker MRN: [id number] Date of Birth: 1926/02/19  Clinical Social Work is seeking post-discharge placement for this patient at the [place] level of care (*CSW will initial, date and re-position this form in  chart as items are completed):  Yes   Patient/family provided with [place] Work Department's list of facilities offering this level of care within the geographic area requested by the patient (or if unable, by the patient's family).  Yes   Patient/family informed of their freedom to choose among providers that offer the needed level of care, that participate in Medicare, Medicaid or managed care program needed by the patient, have an available bed and are willing to accept the patient.  Yes   Patient/family informed of [facility]'s ownership interest in [facility] and [facility], as well as of the fact that they are under no obligation to receive care at these facilities.  PASRR submitted to EDS on 07/03/14     PASRR number received on 07/03/14     Existing PASRR number confirmed on       FL2 transmitted to all facilities in geographic area requested by pt/family on 07/03/14     FL2 transmitted to all facilities within larger geographic area on       Patient informed that his/her managed care company has contracts with or will negotiate with certain facilities, including the following:            Patient/family informed of bed offers received.  Patient chooses bed at       Physician recommends and patient chooses bed at      Patient to be transferred to   on  .  Patient to be transferred to facility by       Patient family notified on   of transfer.  Name of family member notified:        PHYSICIAN       Additional Comment:    _______________________________________________ Williemae Area, LCSW 07/03/2014, 8:20 AM

## 2014-07-03 NOTE — Progress Notes (Signed)
Patient was discharged to nursing home [facility]) by MD order; discharged instructions  review and sent to facility  with care notes; IV DIC; skin intact; facility was called and report was given to the nurse who is going to receive the patient; patient will be transported to facility via EMS.

## 2014-07-03 NOTE — Evaluation (Addendum)
Clinical/Bedside Swallow Evaluation Patient Details  Name: Stacey Walker MRN: [id number] Date of Birth: March 05, 1926  Today's Date: 07/03/2014 Time: SLP Start Time (ACUTE ONLY): 1026 SLP Stop Time (ACUTE ONLY): 1052 SLP Time Calculation (min) (ACUTE ONLY): 26 min  Past Medical History:  Past Medical History  Diagnosis Date  . Hypertension, essential, benign   . Dyslipidemia     Managed by PCP. On statin  . Moderate Right-sided carotid artery disease     50-70% stenosis on the right carotid artery from 2011. Also noted less than 50% right and left subclavian artery stenosis.  . Bladder cancer   . Asymptomatic carotid artery stenosis     Dopplers January 2016: RICA 50-69% tortuous (no change; L ICA ~70%, tortuous (no change), bilateral subclavian arteries <50%  . Cervical cancer    Past Surgical History:  Past Surgical History  Procedure Laterality Date  . Transthoracic echocardiogram  August 7    Normal EF, impaired relaxation with mildly sclerotic aortic valve but no stenosis. Mild left atrial dilation.  Marland Kitchen Nm myoview ltd  August 2007    No ischemia or infarction  . Carotid dopplers  July 2011    50-70% stenosis of the right carotid with <50% stenosis of both the right and left subclavian arteries.  . Bladder tumor excision    . Cholecystectomy    . Abdominal hysterectomy     HPI:  Patient is an 81 y/o female admitted with htn, hyperlipidemia c/o inability to walk. Found to have left upper lobe infiltrate consistent with CAP and elevated troponin.Pt describes difficulty swallowing pills. She says that this began several months ago, when she was concerned that she may have had a stroke. She also says that she has been having difficulty with fine motor tasks (describes bilateral difficulty) and changes to her vocal quality since that time.   Assessment / Plan / Recommendation Clinical Impression  Pt reports concerns for neurological change that began several months ago, including  bilateral difficulty with fine motor tasks, changes in vocal quality, and changes in swallow function, which she describes as difficulty swallowing pills. During skilled observation from SLP, immediate coughing was noted after straw sips of thin liquids. SLP intervention for removal of straw resulted in elimination of overt signs concerning for penetration and/or aspiration. RN present to administer meds, which were given whole in puree per SLP recommendation. Pt appeared to tolerate without overt signs of difficulty or subjective report of trouble. Recommend to continue regular diet and thin liquids without straws, with meds in puree. Will f/u briefly for tolerance. MD may wish to consider assessment of the aforementioned, possible neurological changes.    Aspiration Risk  Mild    Diet Recommendation Regular;Thin liquid   Liquid Administration via: Cup;No straw Medication Administration: Whole meds with puree Supervision: Patient able to self feed;Intermittent supervision to cue for compensatory strategies Compensations: Slow rate;Small sips/bites Postural Changes and/or Swallow Maneuvers: Seated upright 90 degrees;Upright 30-60 min after meal    Other  Recommendations Recommended Consults: Other (Comment) (consider assessment of neurological findings) Oral Care Recommendations: Oral care BID   Follow Up Recommendations  Skilled Nursing facility (brief f/u at SNF if pt d/c today)    Frequency and Duration min 1 x/week  1 week   Pertinent Vitals/Pain n/a    SLP Swallow Goals     Swallow Study Prior Functional Status       General Date of Onset:  (several months ago ) HPI: Patient is an 81 y/o  female admitted with htn, hyperlipidemia c/o inability to walk. Found to have left upper lobe infiltrate consistent with CAP and elevated troponin.Pt describes difficulty swallowing pills. She says that this began several months ago, when she was concerned that she may have had a stroke. She  also says that she has been having difficulty with fine motor tasks (describes bilateral difficulty) and changes to her vocal quality since that time. Type of Study: Bedside swallow evaluation Previous Swallow Assessment: none in chart Diet Prior to this Study: Regular;Thin liquids Temperature Spikes Noted: Yes (99.9) Respiratory Status: Room air History of Recent Intubation: No Behavior/Cognition: Alert;Cooperative Oral Cavity - Dentition: Adequate natural dentition Self-Feeding Abilities: Able to feed self Patient Positioning: Upright in bed Baseline Vocal Quality: Other (comment) (very mild rough quality) Volitional Cough: Strong    Oral/Motor/Sensory Function Overall Oral Motor/Sensory Function: Appears within functional limits for tasks assessed   Ice Chips Ice chips: Not tested   Thin Liquid Thin Liquid: Impaired Presentation: Cup;Self Fed;Straw Pharyngeal  Phase Impairments: Suspected delayed Swallow;Cough - Immediate    Nectar Thick Nectar Thick Liquid: Not tested   Honey Thick Honey Thick Liquid: Not tested   Puree Puree: Within functional limits Presentation: Self Fed;Spoon   Solid    Solid: Within functional limits Presentation: Self Fed      Germain Osgood, M.A. CCC-SLP [phone]  Germain Osgood 07/03/2014,11:04 AM

## 2014-07-06 LAB — CULTURE, BLOOD (ROUTINE X 2)
CULTURE: NO GROWTH
Culture: NO GROWTH

## 2014-07-07 DIAGNOSIS — R279 Unspecified lack of coordination: Secondary | ICD-10-CM | POA: Diagnosis not present

## 2014-07-07 DIAGNOSIS — D090 Carcinoma in situ of bladder: Secondary | ICD-10-CM | POA: Diagnosis not present

## 2014-07-07 DIAGNOSIS — J189 Pneumonia, unspecified organism: Secondary | ICD-10-CM | POA: Diagnosis not present

## 2014-07-07 DIAGNOSIS — R269 Unspecified abnormalities of gait and mobility: Secondary | ICD-10-CM | POA: Diagnosis not present

## 2014-07-07 DIAGNOSIS — M6281 Muscle weakness (generalized): Secondary | ICD-10-CM | POA: Diagnosis not present

## 2014-07-07 DIAGNOSIS — E871 Hypo-osmolality and hyponatremia: Secondary | ICD-10-CM | POA: Diagnosis not present

## 2014-07-07 DIAGNOSIS — R1312 Dysphagia, oropharyngeal phase: Secondary | ICD-10-CM | POA: Diagnosis not present

## 2014-07-07 DIAGNOSIS — I6521 Occlusion and stenosis of right carotid artery: Secondary | ICD-10-CM | POA: Diagnosis not present

## 2014-07-07 DIAGNOSIS — I10 Essential (primary) hypertension: Secondary | ICD-10-CM | POA: Diagnosis not present

## 2014-07-07 DIAGNOSIS — R7989 Other specified abnormal findings of blood chemistry: Secondary | ICD-10-CM | POA: Diagnosis not present

## 2014-07-07 DIAGNOSIS — I6529 Occlusion and stenosis of unspecified carotid artery: Secondary | ICD-10-CM | POA: Diagnosis not present

## 2014-07-07 DIAGNOSIS — I779 Disorder of arteries and arterioles, unspecified: Secondary | ICD-10-CM | POA: Diagnosis not present

## 2014-07-18 ENCOUNTER — Ambulatory Visit (INDEPENDENT_AMBULATORY_CARE_PROVIDER_SITE_OTHER): Payer: Medicare PPO | Admitting: Neurology

## 2014-07-18 ENCOUNTER — Encounter: Payer: Self-pay | Admitting: Neurology

## 2014-07-18 VITALS — BP 112/58 | HR 64 | Resp 16 | Ht 62.0 in | Wt 150.0 lb

## 2014-07-18 DIAGNOSIS — I10 Essential (primary) hypertension: Secondary | ICD-10-CM | POA: Diagnosis not present

## 2014-07-18 DIAGNOSIS — I779 Disorder of arteries and arterioles, unspecified: Secondary | ICD-10-CM | POA: Diagnosis not present

## 2014-07-18 DIAGNOSIS — I739 Peripheral vascular disease, unspecified: Secondary | ICD-10-CM

## 2014-07-18 DIAGNOSIS — R269 Unspecified abnormalities of gait and mobility: Secondary | ICD-10-CM | POA: Diagnosis not present

## 2014-07-18 NOTE — Progress Notes (Signed)
[facility]  PATIENT: Stacey Walker DOB: Oct 23, 1925  REFERRING DOCTOR OR PCP:  Leanna Battles SOURCE: patient, daughter, EMR records, CT images in PACS  _________________________________   HISTORICAL  CHIEF COMPLAINT:  Chief Complaint  Patient presents with  . Gait Disturbance    Sts. on 06-28-14 she had sudden onset of difficulty walking--sts. her feet were "stuck" to the floor.  Denies dizziness--sts. just couldn't pick her feet up off of the floor.  She went to the [facility] ED on 68 the next day, sts. was admitted for IV antibiotics for pneumonia.  Sts. leg weakness never resolved.  She is now at [facility]  Sts. prior to this, she walked with a rollator walker.  She is in a w/c today, but sts. walks with difficulty with walker tires easily/fim   HISTORY OF PRESENT ILLNESS:  I had the pleasure seeing you patient, Stacey Walker, at [facility] for neurologic consultation regarding her gait disturbance as you know, she is an 81 year old right-handed woman who noted difficulty with gait on 06/29/2014  earlier that day, she walked from her room to the dining hall with her walker and felt that she was baseline. A few hours later when she tried to walk she felt that she could not get her feet off of the floor. The next day, she was taken to the [facility] emergency room. She was admitted to receive IV antibiotics for a pneumonia. She was discharged to a rehabilitation facility to get PT and OT. She is currently they're now. She is to be discharged at the end of the week but she is unable to do the amount of walking necessary at her assisted living. At her apartment she needs to walk about 300-400 feet to get to the dining hall. Currently, with breaks she can do about 75 feet. She is tiring out much quicker than she used to. She is having more difficulty getting dressed as well. Additionally, she is having difficulty getting to the  bathroom and is requiring help from the daughter to transfer.  Her daughter notes that there has been a steady decline in her walking. About 9 or 10 years ago she had right hip surgery and her gait did not completely recover. A couple years ago due to progressive worsening she started to use a walker earlier this year for a few weeks she was having more difficulty with walking after one of her friends had died.  They note that she was able to go about 300 feet without a break to get to the dining hall from her room but would have more difficulty on the way back. That walk would also fatigue her tremendously and she will usually go straight to bed.  She has not had any significant change in bladder function recently. She does have a history of bladder cancer treated with surgery.  She has not had any major decline in cognitive function. The daughter helps her with the bills, but mostly due to her poor eyesight and handwriting. They have noticed that she's had a small tremor in her hands more recently. It is intermittent.    She has noted more difficult with swallowing the past 5-6 months.   She has carotid arterial stenosis, with with 60-70% stenosis on the right and 70% on the left.  She also has about 50% bilateral subclavian stenosis.  REVIEW OF SYSTEMS: Constitutional: No fevers, chills, sweats, or change in appetite.  She notes fatigue  and is sometimes sleepy. Eyes: Poor vision due to macular degeneration. Ear, nose and throat: No hearing loss, ear pain, nasal congestion, sore throat Cardiovascular: No chest pain, palpitations.   She has carotid artery stenosis. Respiratory: No shortness of breath at rest or with exertion.   No wheezes GastrointestinaI: No nausea, vomiting, diarrhea, abdominal pain, fecal incontinence.  She has noticed some difficulty swallowing. She has occasional constipation. Genitourinary: No dysuria, urinary retention or frequency.  No nocturia. Musculoskeletal: No neck  pain, back pain.  Has some hip tenderness and had hip surgery in 2009. Integumentary: No rash.  Notes some moles and itching. Neurological: as above Psychiatric: No depression at this time.  No anxiety Endocrine: No palpitations, diaphoresis, change in appetite, change in weigh or increased thirst Hematologic/Lymphatic: No anemia, purpura, petechiae. Allergic/Immunologic: No itchy/runny eyes, rashes.  Sometimes has runny nose.  ALLERGIES: No Known Allergies  HOME MEDICATIONS:  Current outpatient prescriptions:  .  acetaminophen (TYLENOL) 325 MG tablet, Take 2 tablets (650 mg total) by mouth every 6 (six) hours as needed for mild pain or fever (or Fever >/= 101)., Disp: 30 tablet, Rfl: 0 .  aspirin EC 81 MG tablet, Take 81 mg by mouth daily., Disp: , Rfl:  .  cetirizine (ZYRTEC) 10 MG tablet, Take 10 mg by mouth daily., Disp: , Rfl:  .  docusate sodium (COLACE) 100 MG capsule, Take 2 capsules (200 mg total) by mouth at bedtime as needed for mild constipation., Disp: 10 capsule, Rfl: 0 .  ezetimibe-simvastatin (VYTORIN) 10-40 MG per tablet, Take 1 tablet by mouth at bedtime. (Patient taking differently: Take 1 tablet by mouth daily. ), Disp: 28 tablet, Rfl: 0 .  ipratropium (ATROVENT) 0.03 % nasal spray, Place 1 spray into both nostrils 3 (three) times daily. , Disp: , Rfl:  .  LUMIGAN 0.01 % SOLN, Place 1 drop into both eyes at bedtime., Disp: , Rfl:  .  polyethylene glycol (MIRALAX / GLYCOLAX) packet, Take 17 g by mouth daily., Disp: 14 each, Rfl: 0 .  timolol (BETIMOL) 0.5 % ophthalmic solution, Place 1 drop into both eyes daily. , Disp: , Rfl:  .  valsartan-hydrochlorothiazide (DIOVAN-HCT) 320-12.5 MG per tablet, Take 1 tablet by mouth daily., Disp: , Rfl:  .  amoxicillin-clavulanate (AUGMENTIN) 600-42.9 MG/5ML suspension, Take 5 mLs (600 mg total) by mouth 2 (two) times daily. (Patient not taking: Reported on 07/18/2014), Disp: , Rfl: 0 .  Multiple Vitamin (MULTIVITAMIN) tablet, Take 1  tablet by mouth daily., Disp: , Rfl:  .  niacin (SLO-NIACIN) 500 MG tablet, Take 500 mg by mouth daily., Disp: , Rfl:  .  Omega-3 Krill Oil 300 MG CAPS, Take 300 mg by mouth daily., Disp: , Rfl:  .  saccharomyces boulardii (FLORASTOR) 250 MG capsule, Take 1 capsule (250 mg total) by mouth 2 (two) times daily. (Patient not taking: Reported on 07/18/2014), Disp: 30 capsule, Rfl: 0  PAST MEDICAL HISTORY: Past Medical History  Diagnosis Date  . Hypertension, essential, benign   . Dyslipidemia     Managed by PCP. On statin  . Moderate Right-sided carotid artery disease     50-70% stenosis on the right carotid artery from 2011. Also noted less than 50% right and left subclavian artery stenosis.  . Bladder cancer   . Asymptomatic carotid artery stenosis     Dopplers January 2016: RICA 50-69% tortuous (no change; L ICA ~70%, tortuous (no change), bilateral subclavian arteries <50%  . Cervical cancer     PAST SURGICAL HISTORY:  Past Surgical History  Procedure Laterality Date  . Transthoracic echocardiogram  August 7    Normal EF, impaired relaxation with mildly sclerotic aortic valve but no stenosis. Mild left atrial dilation.  Marland Kitchen Nm myoview ltd  August 2007    No ischemia or infarction  . Carotid dopplers  July 2011    50-70% stenosis of the right carotid with <50% stenosis of both the right and left subclavian arteries.  . Bladder tumor excision    . Cholecystectomy    . Abdominal hysterectomy      FAMILY HISTORY: Family History  Problem Relation Age of Onset  . Lung cancer Father   . Dementia Mother     SOCIAL HISTORY:  History   Social History  . Marital Status: Widowed    Spouse Name: N/A  . Number of Children: N/A  . Years of Education: N/A   Occupational History  . Not on file.   Social History Main Topics  . Smoking status: Former Smoker    Quit date: 03/08/1978  . Smokeless tobacco: Never Used  . Alcohol Use: Yes     Comment: occas  . Drug Use: No  . Sexual  Activity: Not on file   Other Topics Concern  . Not on file   Social History Narrative   Widowed mother of one, grandmother of one. She quit smoking in 1980. She has a rare alcohol beverage from time to time. She currently lives at [facility] on [address].   She is able to get around there without to much difficulty.   During previous visit, she voiced the desire for DO NOT RESUSCITATE and DO NOT INTUBATE. She also is with the desire to avoid any invasive procedures.      PHYSICAL EXAM  Filed Vitals:   07/18/14 1038  BP: 112/58  Pulse: 64  Resp: 16  Height: 5\' 2"  (1.575 m)  Weight: 150 lb (68.04 kg)    Body mass index is 27.43 kg/(m^2).   General: The patient is well-developed and well-nourished and in no acute distress  Neck: The neck is supple, carotid bruits are noted.  The neck is nontender.  Cardiovascular: The heart has a regular rate and rhythm with a normal S1 and S2. There were no murmurs, gallops or rubs. Lungs are clear to auscultation.  Skin: Extremities are without significant edema.  Musculoskeletal:  Back is nontender  Neurologic Exam  Mental status: The patient is alert and oriented x 3 at the time of the examination. The patient has apparent normal recent and remote memory, with an apparently normal attention span and concentration ability.   Speech is normal.  Cranial nerves: Extraocular movements are full.  Facial symmetry is present. There is good facial sensation to soft touch bilaterally.Facial strength is normal.  Trapezius and sternocleidomastoid strength is normal. No dysarthria is noted.  The tongue is midline, and the patient has symmetric elevation of the soft palate. No obvious hearing deficits are noted.  Motor:  Muscle bulk is normal.   Tone is normal. There is no cogwheeling. Strength is  5 / 5 in the arms and 4/5 in the legs..   Sensory: Sensory testing is intact to pinprick, soft touch and vibration sensation in all 4  extremities.  Coordination: Cerebellar testing reveals good finger-nose-finger and reduced heel-to-shin  (more due to strength) bilaterally.  Gait and station: Station is wide stance.   Gait requires some support. Stride is small and stance is wide.  She takes  several steps to do 180 turn. She cannot tandem walk.  She has retropulsion. Romberg is negative.   Reflexes: Deep tendon reflexes are symmetric and 1+ bilaterally in arms and knees.   Plantar responses are flexor.    DIAGNOSTIC DATA (LABS, IMAGING, TESTING) - I reviewed patient records, labs, notes, testing and imaging myself where available.  Lab Results  Component Value Date   WBC 5.9 07/01/2014   HGB 12.4 07/01/2014   HCT 37.7 07/01/2014   MCV 89.1 07/01/2014   PLT 221 07/01/2014      Component Value Date/Time   NA 133* 07/01/2014 0643   K 3.5 07/01/2014 0643   CL 98 07/01/2014 0643   CO2 25 07/01/2014 0643   GLUCOSE 111* 07/01/2014 0643   BUN 7 07/01/2014 0643   CREATININE 0.85 07/01/2014 0643   CALCIUM 8.6 07/01/2014 0643   PROT 6.1 07/01/2014 0643   ALBUMIN 2.8* 07/01/2014 0643   AST 22 07/01/2014 0643   ALT 13 07/01/2014 0643   ALKPHOS 33* 07/01/2014 0643   BILITOT 0.6 07/01/2014 0643   GFRNONAA 60* 07/01/2014 0643   GFRAA 69* 07/01/2014 0643       ASSESSMENT AND PLAN  Gait disorder  Bilateral carotid artery disease  Hypertension, essential, benign   In summary, Sani Loiseau is an 81 year old woman who had a subacute worsening of gait 2 weeks ago superimposed on chronic worsening gait over the past several years. I believe that this subacute worsening was likely due to her pneumonia. She has improved over the past few days but is not ready to return to her assisted living apartment. I believe that she needs an additional week or 2 in an inpatient skilled nursing facility to allow her to get daily PT/OT and to recover closer to her baseline.  If she is unable to recover to her baseline, then she would  likely benefit from an electric wheelchair to allow her to keep more independent and to perform her activities of daily living including going to and from the lunchroom.  I discussed with Lashondra and her daughter some possible neurologic processes that could be playing a role in her progressive gait disorder over the past few years. Muscle tone is normal so I do not believe that she has Parkinson's disease. However, she does have a "magnetic gait" with retropulsion and that could be seen with normal pressure hydrocephalus or with cervical spine stenosis.  We will check a noncontrasted MRI of the brain and spinal cord to better evaluate for these possible causes of her gait disorder and to rule out other events such as stroke or mass lesion.  She will return to see me in 6 weeks or sooner if she has new or worsening neurologic symptoms.    Richard A. Felecia Shelling, MD, PhD 09/24/9468, 96:28 AM Certified in Neurology, Clinical Neurophysiology, Sleep Medicine, Pain Medicine and Neuroimaging  [facility] [address] [place], [place] [address] [phone]

## 2014-07-30 ENCOUNTER — Telehealth: Payer: Self-pay | Admitting: Neurology

## 2014-07-30 DIAGNOSIS — R269 Unspecified abnormalities of gait and mobility: Secondary | ICD-10-CM

## 2014-07-30 DIAGNOSIS — I779 Disorder of arteries and arterioles, unspecified: Secondary | ICD-10-CM

## 2014-07-30 DIAGNOSIS — I10 Essential (primary) hypertension: Secondary | ICD-10-CM

## 2014-07-30 DIAGNOSIS — I739 Peripheral vascular disease, unspecified: Secondary | ICD-10-CM

## 2014-07-30 NOTE — Telephone Encounter (Signed)
Romie Jumper called in regards to MRI for patient.  No order has been placed for patient.  Please advise patient on questions regarding expected MRI.  [name] [phone].

## 2014-08-01 ENCOUNTER — Telehealth: Payer: Self-pay | Admitting: Neurology

## 2014-08-01 DIAGNOSIS — I779 Disorder of arteries and arterioles, unspecified: Secondary | ICD-10-CM

## 2014-08-01 DIAGNOSIS — I739 Peripheral vascular disease, unspecified: Secondary | ICD-10-CM

## 2014-08-01 DIAGNOSIS — I10 Essential (primary) hypertension: Secondary | ICD-10-CM

## 2014-08-01 DIAGNOSIS — R269 Unspecified abnormalities of gait and mobility: Secondary | ICD-10-CM

## 2014-08-01 NOTE — Addendum Note (Signed)
Addended by: France Ravens I on: 08/01/2014 04:34 PM   Modules accepted: Orders

## 2014-08-01 NOTE — Telephone Encounter (Signed)
MRI brain Ltd. W/o contrast  and cervical spine with contrast was ordered.  The notes say mri brain w/o contrast and Cervical w/o contrast.  Please check orders as they were ordered differently from notes.

## 2014-08-01 NOTE — Telephone Encounter (Signed)
I spoke with Stacey Walker and Stacey Walker and advised that I see that RAS intended to order mri brain and c-spine without contrast, but for some reason, order was not entered/didn't cross over.  I have put orders in and advised that as soon as ins. has approved, our office will call to schedule/fim

## 2014-08-02 ENCOUNTER — Other Ambulatory Visit: Payer: Self-pay | Admitting: *Deleted

## 2014-08-02 DIAGNOSIS — I779 Disorder of arteries and arterioles, unspecified: Secondary | ICD-10-CM

## 2014-08-02 DIAGNOSIS — I739 Peripheral vascular disease, unspecified: Secondary | ICD-10-CM

## 2014-08-02 DIAGNOSIS — R269 Unspecified abnormalities of gait and mobility: Secondary | ICD-10-CM

## 2014-08-02 DIAGNOSIS — I10 Essential (primary) hypertension: Secondary | ICD-10-CM

## 2014-08-29 ENCOUNTER — Encounter: Payer: Self-pay | Admitting: Neurology

## 2014-08-29 ENCOUNTER — Ambulatory Visit (INDEPENDENT_AMBULATORY_CARE_PROVIDER_SITE_OTHER): Payer: Medicare PPO | Admitting: Neurology

## 2014-08-29 VITALS — BP 158/88 | HR 66

## 2014-08-29 DIAGNOSIS — R269 Unspecified abnormalities of gait and mobility: Secondary | ICD-10-CM | POA: Diagnosis not present

## 2014-08-29 NOTE — Progress Notes (Signed)
[facility]  PATIENT: Stacey Walker DOB: June 25, 1925  REFERRING DOCTOR OR PCP:  Leanna Battles SOURCE: patient, daughter, EMR records, CT images in PACS  _________________________________   HISTORICAL  CHIEF COMPLAINT:  Chief Complaint  Patient presents with  . Gait Disturbance    Sts. she is walking some better since last office visit.  Sts. she has not had mri brain/c-spine--"I don't want them."  Sts. our office tried to contact her several times to schedule them, but she was unable to locate a # to call us back./fim   HISTORY OF PRESENT ILLNESS:  Stacey Walker is an 81 year old right-handed woman with chronic low worsening of gait over the past 9 years who noted difficulty much more difficulty with gait on 06/29/2014.    The worsening was in association with an aspiration pneumonia where she was hospitalized and then received physical therapy afterwards. Her ability to walk was down from 300 feet to 75 feet. I evaluated her and was concerned that she had a "magnetic gait" and ordered MRI of the head to rule out normal pressure hydrocephalus and MRI of the cervical spine to rule out cervical spine stenosis/myelopathy. She chose not to do the studies.  In the interim, she has improved and is practically back at her baseline. She is able to walk without the walker now and her stride has improved greatly, now being able to raise her feet some as she walks. She feels she can do 300 feet.   She has no major decline in cognitive function. The daughter helps her with the bills due to her poor eyesight and handwriting. They have noticed that she's had a small tremor in her hands more recently. It is intermittent.  No bladder frequency or incontinence.   She has noted more difficult with swallowing the past 5-6 months.   She has carotid arterial stenosis, with with 60-70% stenosis on the right and 70% on the left.  She also has about 50% bilateral subclavian stenosis.  REVIEW  OF SYSTEMS: Constitutional: No fevers, chills, sweats, or change in appetite.  She notes fatigue and is sometimes sleepy. Eyes: Poor vision due to macular degeneration. Ear, nose and throat: No hearing loss, ear pain, nasal congestion, sore throat Cardiovascular: No chest pain, palpitations.   She has carotid artery stenosis. Respiratory: No shortness of breath at rest or with exertion.   No wheezes GastrointestinaI: No nausea, vomiting, diarrhea, abdominal pain, fecal incontinence.  She has noticed some difficulty swallowing. She has occasional constipation. Genitourinary: No dysuria, urinary retention or frequency.  No nocturia. Musculoskeletal: No neck pain, back pain.  Has some hip tenderness and had hip surgery in 2009. Integumentary: No rash.  Notes some moles and itching. Neurological: as above Psychiatric: No depression at this time.  No anxiety Endocrine: No palpitations, diaphoresis, change in appetite, change in weigh or increased thirst Hematologic/Lymphatic: No anemia, purpura, petechiae. Allergic/Immunologic: No itchy/runny eyes, rashes.  Sometimes has runny nose.  ALLERGIES: No Known Allergies  HOME MEDICATIONS:  Current outpatient prescriptions:  .  aspirin EC 81 MG tablet, Take 81 mg by mouth daily., Disp: , Rfl:  .  cetirizine (ZYRTEC) 10 MG tablet, Take 10 mg by mouth daily., Disp: , Rfl:  .  docusate sodium (COLACE) 100 MG capsule, Take 2 capsules (200 mg total) by mouth at bedtime as needed for mild constipation., Disp: 10 capsule, Rfl: 0 .  ezetimibe-simvastatin (VYTORIN) 10-40 MG per tablet, Take 1 tablet by mouth at bedtime. (Patient taking differently:  Take 1 tablet by mouth daily. ), Disp: 28 tablet, Rfl: 0 .  ipratropium (ATROVENT) 0.03 % nasal spray, Place 1 spray into both nostrils 3 (three) times daily. , Disp: , Rfl:  .  LUMIGAN 0.01 % SOLN, Place 1 drop into both eyes at bedtime., Disp: , Rfl:  .  Multiple Vitamin (MULTIVITAMIN) tablet, Take 1 tablet by  mouth daily., Disp: , Rfl:  .  niacin (SLO-NIACIN) 500 MG tablet, Take 500 mg by mouth daily., Disp: , Rfl:  .  Omega-3 Krill Oil 300 MG CAPS, Take 300 mg by mouth daily., Disp: , Rfl:  .  polyethylene glycol (MIRALAX / GLYCOLAX) packet, Take 17 g by mouth daily., Disp: 14 each, Rfl: 0 .  timolol (BETIMOL) 0.5 % ophthalmic solution, Place 1 drop into both eyes daily. , Disp: , Rfl:  .  valsartan-hydrochlorothiazide (DIOVAN-HCT) 320-12.5 MG per tablet, Take 1 tablet by mouth daily., Disp: , Rfl:  .  acetaminophen (TYLENOL) 325 MG tablet, Take 2 tablets (650 mg total) by mouth every 6 (six) hours as needed for mild pain or fever (or Fever >/= 101). (Patient not taking: Reported on 08/29/2014), Disp: 30 tablet, Rfl: 0 .  amoxicillin-clavulanate (AUGMENTIN) 600-42.9 MG/5ML suspension, Take 5 mLs (600 mg total) by mouth 2 (two) times daily. (Patient not taking: Reported on 07/18/2014), Disp: , Rfl: 0 .  saccharomyces boulardii (FLORASTOR) 250 MG capsule, Take 1 capsule (250 mg total) by mouth 2 (two) times daily. (Patient not taking: Reported on 07/18/2014), Disp: 30 capsule, Rfl: 0  PAST MEDICAL HISTORY: Past Medical History  Diagnosis Date  . Hypertension, essential, benign   . Dyslipidemia     Managed by PCP. On statin  . Moderate Right-sided carotid artery disease     50-70% stenosis on the right carotid artery from 2011. Also noted less than 50% right and left subclavian artery stenosis.  . Bladder cancer   . Asymptomatic carotid artery stenosis     Dopplers January 2016: RICA 50-69% tortuous (no change; L ICA ~70%, tortuous (no change), bilateral subclavian arteries <50%  . Cervical cancer     PAST SURGICAL HISTORY: Past Surgical History  Procedure Laterality Date  . Transthoracic echocardiogram  August 7    Normal EF, impaired relaxation with mildly sclerotic aortic valve but no stenosis. Mild left atrial dilation.  Marland Kitchen Nm myoview ltd  August 2007    No ischemia or infarction  . Carotid  dopplers  July 2011    50-70% stenosis of the right carotid with <50% stenosis of both the right and left subclavian arteries.  . Bladder tumor excision    . Cholecystectomy    . Abdominal hysterectomy      FAMILY HISTORY: Family History  Problem Relation Age of Onset  . Lung cancer Father   . Dementia Mother     SOCIAL HISTORY:  History   Social History  . Marital Status: Widowed    Spouse Name: N/A  . Number of Children: N/A  . Years of Education: N/A   Occupational History  . Not on file.   Social History Main Topics  . Smoking status: Former Smoker    Quit date: 03/08/1978  . Smokeless tobacco: Never Used  . Alcohol Use: Yes     Comment: occas  . Drug Use: No  . Sexual Activity: Not on file   Other Topics Concern  . Not on file   Social History Narrative   Widowed mother of one, grandmother of one. She quit  smoking in 1980. She has a rare alcohol beverage from time to time. She currently lives at [facility] on [address].   She is able to get around there without to much difficulty.   During previous visit, she voiced the desire for DO NOT RESUSCITATE and DO NOT INTUBATE. She also is with the desire to avoid any invasive procedures.      PHYSICAL EXAM  Filed Vitals:   08/29/14 1049  BP: 158/88  Pulse: 66    There is no weight on file to calculate BMI.   General: The patient is well-developed and well-nourished and in no acute distress   Neurologic Exam  Mental status: The patient is alert and oriented x 3 at the time of the examination. The patient has apparent normal recent and remote memory, with an apparently normal attention span and concentration ability.   Speech is normal.  Cranial nerves: Extraocular movements are full.  Facial symmetry is present.  Facial strength is normal.  Trapezius and sternocleidomastoid strength is normal. No dysarthria is noted.    No obvious hearing deficits are noted.  Motor:  Muscle bulk is  normal.   Tone is normal. There is no cogwheeling. Strength is  5 / 5 in the arms and 4/5 in the legs..   Sensory: Sensory testing is intact to touch  in all 4 extremities.  Gait and station: Station is mildly wide stance.   Gait requires no support now and stride is only mildly reduced (for age) .  She takes 3 steps to do 180 turn. She cannot tandem walk.   Romberg is negative.   Reflexes: Deep tendon reflexes are symmetric and 1+ bilaterally  knees.      DIAGNOSTIC DATA (LABS, IMAGING, TESTING) - I reviewed patient records, labs, notes, testing and imaging myself where available.  Lab Results  Component Value Date   WBC 5.9 07/01/2014   HGB 12.4 07/01/2014   HCT 37.7 07/01/2014   MCV 89.1 07/01/2014   PLT 221 07/01/2014      Component Value Date/Time   NA 133* 07/01/2014 0643   K 3.5 07/01/2014 0643   CL 98 07/01/2014 0643   CO2 25 07/01/2014 0643   GLUCOSE 111* 07/01/2014 0643   BUN 7 07/01/2014 0643   CREATININE 0.85 07/01/2014 0643   CALCIUM 8.6 07/01/2014 0643   PROT 6.1 07/01/2014 0643   ALBUMIN 2.8* 07/01/2014 0643   AST 22 07/01/2014 0643   ALT 13 07/01/2014 0643   ALKPHOS 33* 07/01/2014 0643   BILITOT 0.6 07/01/2014 0643   GFRNONAA 60* 07/01/2014 0643   GFRAA 69* 07/01/2014 0643       ASSESSMENT AND PLAN  Gait disorder   1.  She is doing better, she will continue to be as active as she can be an use her walker for safety. 2.  She will return as needed. I discussed with both her and her daughter that if a further decline is noted that we wouldreconsider imaging. If she is  reluctant to do MRI I would recommend at least that we check a CT scan of the head to assess for normal pressure hydrocephalus.       Korrine Sicard A. Felecia Shelling, MD, PhD 9/50/9326, 71:24 AM Certified in Neurology, Clinical Neurophysiology, Sleep Medicine, Pain Medicine and Neuroimaging  [facility] [address], [place], [place] [address]

## 2014-08-29 NOTE — Patient Instructions (Signed)
If there is any worsening of the gait she is to give [place] a call so we can reevaluate

## 2014-09-05 DIAGNOSIS — H4011X2 Primary open-angle glaucoma, moderate stage: Secondary | ICD-10-CM | POA: Diagnosis not present

## 2014-09-11 ENCOUNTER — Telehealth: Payer: Self-pay | Admitting: Cardiology

## 2014-09-11 MED ORDER — EZETIMIBE-SIMVASTATIN 10-40 MG PO TABS: 1.0000 | ORAL_TABLET | Freq: Every day | ORAL | Status: DC

## 2014-09-11 NOTE — Telephone Encounter (Signed)
Pt informed samples available for her at front desk.

## 2014-09-11 NOTE — Telephone Encounter (Signed)
Patient calling the office for samples of medication:   1.  What medication and dosage are you requesting samples for? Vytorin  2.  Are you currently out of this medication? Enough for a week  3. Are you requesting samples to get you through until a mail order prescription arrives? no

## 2014-09-20 ENCOUNTER — Telehealth (HOSPITAL_COMMUNITY): Payer: Self-pay | Admitting: *Deleted

## 2014-09-20 NOTE — Telephone Encounter (Signed)
Pt would like to speak with the nurse or doctor regarding her carotid doppler tests. She wasnts to know how necessary the test is. She feels like this is the only thing being done for her.  Please call

## 2014-09-20 NOTE — Telephone Encounter (Signed)
Spoke to patient Informed patient carotid doppler is actually due in Jan 2017 instead of now.  patient wanted to know if there was any changes from the previous 3  carotid doppler  RN informed patient no change from 03/2014 and 09/2013 results. Patient will have carotid doppler done 03/2015  schedule appointment with Dr Ellyn Hack IN 2018. Patient is in agreement. Notified Ebony -scheduler - future order in place.

## 2014-10-15 ENCOUNTER — Telehealth: Payer: Self-pay | Admitting: Cardiology

## 2014-10-15 MED ORDER — EZETIMIBE-SIMVASTATIN 10-40 MG PO TABS: 1.0000 | ORAL_TABLET | Freq: Every day | ORAL | Status: DC

## 2014-10-15 NOTE — Telephone Encounter (Signed)
Patient aware samples are at the front desk for pick up  

## 2014-10-15 NOTE — Telephone Encounter (Signed)
Patient calling the office for samples of medication:   1.  What medication and dosage are you requesting samples for? Vytorin  2.  Are you currently out of this medication? 1 pill lefy  3. Are you requesting samples to get you through until a mail order prescription arrives?no

## 2014-11-06 ENCOUNTER — Telehealth: Payer: Self-pay | Admitting: Cardiology

## 2014-11-06 MED ORDER — EZETIMIBE-SIMVASTATIN 10-40 MG PO TABS: 1.0000 | ORAL_TABLET | Freq: Every day | ORAL | Status: DC

## 2014-11-06 NOTE — Telephone Encounter (Signed)
Patient aware no samples are available and she requested refill to be sent to pharmacy.  Rx(s) sent to pharmacy electronically.

## 2014-11-06 NOTE — Telephone Encounter (Signed)
Patient calling the office for samples of medication: ° ° °1.  What medication and dosage are you requesting samples for? Vytorin 10/40 ° °2.  Are you currently out of this medication? Yes  ° °3. Are you requesting samples to get you through until a mail order prescription arrives? No  ° °

## 2015-01-23 DIAGNOSIS — H401112 Primary open-angle glaucoma, right eye, moderate stage: Secondary | ICD-10-CM | POA: Diagnosis not present

## 2015-01-23 DIAGNOSIS — Z961 Presence of intraocular lens: Secondary | ICD-10-CM | POA: Diagnosis not present

## 2015-01-23 DIAGNOSIS — H04123 Dry eye syndrome of bilateral lacrimal glands: Secondary | ICD-10-CM | POA: Diagnosis not present

## 2015-01-23 DIAGNOSIS — H401122 Primary open-angle glaucoma, left eye, moderate stage: Secondary | ICD-10-CM | POA: Diagnosis not present

## 2015-02-07 ENCOUNTER — Telehealth: Payer: Self-pay | Admitting: Cardiology

## 2015-02-07 NOTE — Telephone Encounter (Signed)
Patient aware samples not stocked. She voiced understanding. Offered refill, she states she still has refills left.

## 2015-02-07 NOTE — Telephone Encounter (Signed)
Patient calling the office for samples of medication:   1.  What medication and dosage are you requesting samples for?Vytorin  2.  Are you currently out of this medication? No,4 days left

## 2015-02-08 ENCOUNTER — Other Ambulatory Visit: Payer: Self-pay | Admitting: Cardiology

## 2015-02-11 NOTE — Telephone Encounter (Signed)
Rx(s) sent to pharmacy electronically.  

## 2015-04-24 DIAGNOSIS — M810 Age-related osteoporosis without current pathological fracture: Secondary | ICD-10-CM | POA: Insufficient documentation

## 2015-06-23 ENCOUNTER — Ambulatory Visit (HOSPITAL_COMMUNITY)
Admission: RE | Admit: 2015-06-23 | Discharge: 2015-06-23 | Disposition: A | Discharge status: Home / Self Care | Payer: Medicare PPO | Source: Ambulatory Visit | Attending: Internal Medicine | Admitting: Internal Medicine

## 2015-09-08 ENCOUNTER — Ambulatory Visit (INDEPENDENT_AMBULATORY_CARE_PROVIDER_SITE_OTHER): Payer: Medicare Other | Admitting: Neurology

## 2015-09-08 ENCOUNTER — Encounter: Payer: Self-pay | Admitting: Neurology

## 2015-09-08 VITALS — BP 126/64 | HR 72 | Resp 18 | Ht 62.0 in | Wt 139.0 lb

## 2015-09-08 DIAGNOSIS — R269 Unspecified abnormalities of gait and mobility: Secondary | ICD-10-CM | POA: Diagnosis not present

## 2015-09-08 DIAGNOSIS — R251 Tremor, unspecified: Secondary | ICD-10-CM | POA: Diagnosis not present

## 2015-09-08 DIAGNOSIS — I779 Disorder of arteries and arterioles, unspecified: Secondary | ICD-10-CM

## 2015-09-08 DIAGNOSIS — N3941 Urge incontinence: Secondary | ICD-10-CM | POA: Diagnosis not present

## 2015-09-08 DIAGNOSIS — I739 Peripheral vascular disease, unspecified: Secondary | ICD-10-CM

## 2015-09-08 MED ORDER — CARBIDOPA-LEVODOPA 25-100 MG PO TABS
ORAL_TABLET | ORAL | Status: DC
Start: 2015-09-08 — End: 2015-10-05

## 2015-09-08 NOTE — Progress Notes (Signed)
[facility]  PATIENT: CHAISTY SAGERS DOB: [birth year]  REFERRING DOCTOR OR PCP:  Leanna Battles SOURCE: patient, daughter, EMR records, CT images in PACS  _________________________________   HISTORICAL  CHIEF COMPLAINT:  Chief Complaint  Patient presents with  . Gait Disturbance    Sts. she feels general weakness is worse.  Also thinks sts. tremors in hands are a problem.  In the past, she has declined imaging studies, but sts. she will have them now if needed./fim   HISTORY OF PRESENT ILLNESS:  Stacey Walker is an 82 year old right-handed woman I first saw May, 2016 with chronic low worsening of gait over the past 9 years and much more difficulty with gait since April 2016.   The worsening was in association with an aspiration pneumonia where she was hospitalized and then received physical therapy afterwards. Her ability to walk was down from 300 feet to 75 feet. I evaluated her and was concerned that she had a "magnetic gait" and ordered MRI of the head to rule out normal pressure hydrocephalus and MRI of the cervical spine to rule out cervical spine stenosis/myelopathy. She chose not to do the studies.  When she returned 08/29/14, she has improved and was practically back at her baseline.Stride improved greatly, and she was able to walk 300 feet.        However, over the past year, gait again worsened slowly.   With a rollator, she can go 75 feet and 50 feet with a regular walker.  Stride is small again and she takes many steps to turn around 180 degrees.   She feels balance is reduced as well.    She is able to rise up from a chair without using arms.      She notes a tremor in her hands but it is not constant.    She has trouble picking up utensils but does not feel using them is much different.  Tremor is worse with handwriting (even with thick pen) and daughter notes it more when she has a more hectic day.     She has no major decline in cognitive function. The  daughter helps her with the bills due to her poor eyesight and handwriting. Rarely, she has mild verbal fluency issues and is slightly forgetful but not more so than last year.   She has some urinary urgency and rare incontinence as she enters the bathroom.    She has carotid arterial stenosis, with with 60-70% stenosis on the right and 70% on the left.  She also has about 50% bilateral subclavian stenosis.  REVIEW OF SYSTEMS: Constitutional: No fevers, chills, sweats, or change in appetite.  She notes fatigue and is sometimes sleepy. Eyes: Poor vision due to macular degeneration. Ear, nose and throat: No hearing loss, ear pain, nasal congestion, sore throat Cardiovascular: No chest pain, palpitations.   She has carotid artery stenosis. Respiratory: No shortness of breath at rest or with exertion.   No wheezes GastrointestinaI: No nausea, vomiting, diarrhea, abdominal pain, fecal incontinence.  She has noticed some difficulty swallowing. She has occasional constipation. Genitourinary: No dysuria, urinary retention or frequency.  No nocturia. Musculoskeletal: No neck pain, back pain.  Has some hip tenderness and had hip surgery in 2009. Integumentary: No rash.  Notes some moles and itching. Neurological: as above Psychiatric: No depression at this time.  No anxiety Endocrine: No palpitations, diaphoresis, change in appetite, change in weigh or increased thirst Hematologic/Lymphatic: No anemia, purpura, petechiae. Allergic/Immunologic: No itchy/runny eyes,  rashes.  Sometimes has runny nose.  ALLERGIES: No Known Allergies  HOME MEDICATIONS:  Current outpatient prescriptions:  .  aspirin EC 81 MG tablet, Take 81 mg by mouth daily., Disp: , Rfl:  .  docusate sodium (COLACE) 100 MG capsule, Take 2 capsules (200 mg total) by mouth at bedtime as needed for mild constipation., Disp: 10 capsule, Rfl: 0 .  ipratropium (ATROVENT) 0.03 % nasal spray, Place 1 spray into both nostrils 3 (three)  times daily. , Disp: , Rfl:  .  LUMIGAN 0.01 % SOLN, Place 1 drop into both eyes at bedtime., Disp: , Rfl:  .  magnesium hydroxide (MILK OF MAGNESIA) 400 MG/5ML suspension, Take by mouth daily as needed for mild constipation., Disp: , Rfl:  .  Multiple Vitamin (MULTIVITAMIN) tablet, Take 1 tablet by mouth daily., Disp: , Rfl:  .  Omega-3 Krill Oil 300 MG CAPS, Take 300 mg by mouth daily., Disp: , Rfl:  .  polyethylene glycol (MIRALAX / GLYCOLAX) packet, Take 17 g by mouth daily., Disp: 14 each, Rfl: 0 .  timolol (BETIMOL) 0.5 % ophthalmic solution, Place 1 drop into both eyes daily. , Disp: , Rfl:  .  valsartan-hydrochlorothiazide (DIOVAN-HCT) 320-12.5 MG per tablet, Take 1 tablet by mouth daily., Disp: , Rfl:  .  VYTORIN 10-40 MG tablet, TAKE ONE TABLET BY MOUTH ONE TIME DAILY, Disp: 90 tablet, Rfl: 3 .  acetaminophen (TYLENOL) 325 MG tablet, Take 2 tablets (650 mg total) by mouth every 6 (six) hours as needed for mild pain or fever (or Fever >/= 101). (Patient not taking: Reported on 08/29/2014), Disp: 30 tablet, Rfl: 0  PAST MEDICAL HISTORY: Past Medical History  Diagnosis Date  . Hypertension, essential, benign   . Dyslipidemia     Managed by PCP. On statin  . Moderate Right-sided carotid artery disease     50-70% stenosis on the right carotid artery from 2011. Also noted less than 50% right and left subclavian artery stenosis.  . Bladder cancer ([place])   . Asymptomatic carotid artery stenosis     Dopplers January 2016: RICA 50-69% tortuous (no change; L ICA ~70%, tortuous (no change), bilateral subclavian arteries <50%  . Cervical cancer ([place])     PAST SURGICAL HISTORY: Past Surgical History  Procedure Laterality Date  . Transthoracic echocardiogram  August 7    Normal EF, impaired relaxation with mildly sclerotic aortic valve but no stenosis. Mild left atrial dilation.  Marland Kitchen Nm myoview ltd  August 2007    No ischemia or infarction  . Carotid dopplers  July 2011    50-70% stenosis of  the right carotid with <50% stenosis of both the right and left subclavian arteries.  . Bladder tumor excision    . Cholecystectomy    . Abdominal hysterectomy      FAMILY HISTORY: Family History  Problem Relation Age of Onset  . Lung cancer Father   . Dementia Mother     SOCIAL HISTORY:  Social History   Social History  . Marital Status: Widowed    Spouse Name: N/A  . Number of Children: N/A  . Years of Education: N/A   Occupational History  . Not on file.   Social History Main Topics  . Smoking status: Former Smoker    Quit date: 03/08/1978  . Smokeless tobacco: Never Used  . Alcohol Use: Yes     Comment: occas  . Drug Use: No  . Sexual Activity: Not on file   Other Topics Concern  .  Not on file   Social History Narrative   Widowed mother of one, grandmother of one. She quit smoking in 1980. She has a rare alcohol beverage from time to time. She currently lives at [address].   She is able to get around there without to much difficulty.   During previous visit, she voiced the desire for DO NOT RESUSCITATE and DO NOT INTUBATE. She also is with the desire to avoid any invasive procedures.      PHYSICAL EXAM  Filed Vitals:   09/08/15 0900  BP: 126/64  Pulse: 72  Resp: 18  Height: 5\' 2"  (1.575 m)  Weight: 139 lb (63.05 kg)    Body mass index is 25.42 kg/(m^2).   General: The patient is well-developed and well-nourished and in no acute distress   Neurologic Exam  Mental status: The patient is alert and oriented x 3 at the time of the examination. The patient has apparent normal recent and remote memory, with an apparently normal attention span and concentration ability.   Speech is normal.  Cranial nerves: Extraocular movements are full.  Facial symmetry is present.  Facial strength is normal.  Trapezius and sternocleidomastoid strength is normal. No dysarthria is noted.    No obvious hearing deficits are noted.  Motor:   Muscle bulk is normal.   Tone is normal. There is no cogwheeling. Strength is  5 / 5 in the arms and 4/5 in the legs..   Sensory: Sensory testing is intact to touch  in all 4 extremities.  Gait and station: Station is mildly wide stance.   Gait requires no support now and stride is only mildly reduced (for age) .  She takes 3 steps to do 180 turn. She cannot tandem walk.   Romberg is negative.   Reflexes: Deep tendon reflexes are symmetric and 1+ bilaterally  knees.      DIAGNOSTIC DATA (LABS, IMAGING, TESTING) - I reviewed patient records, labs, notes, testing and imaging myself where available.  Lab Results  Component Value Date   WBC 5.9 07/01/2014   HGB 12.4 07/01/2014   HCT 37.7 07/01/2014   MCV 89.1 07/01/2014   PLT 221 07/01/2014      Component Value Date/Time   NA 133* 07/01/2014 0643   K 3.5 07/01/2014 0643   CL 98 07/01/2014 0643   CO2 25 07/01/2014 0643   GLUCOSE 111* 07/01/2014 0643   BUN 7 07/01/2014 0643   CREATININE 0.85 07/01/2014 0643   CALCIUM 8.6 07/01/2014 0643   PROT 6.1 07/01/2014 0643   ALBUMIN 2.8* 07/01/2014 0643   AST 22 07/01/2014 0643   ALT 13 07/01/2014 0643   ALKPHOS 33* 07/01/2014 0643   BILITOT 0.6 07/01/2014 0643   GFRNONAA 60* 07/01/2014 0643   GFRAA 69* 07/01/2014 0643       ASSESSMENT AND PLAN  Gait disorder - Plan: CT HEAD WO CONTRAST  Bilateral carotid artery disease (HCC)  Urge incontinence - Plan: CT HEAD WO CONTRAST  Tremor    1.   Can't rule out that she has an early Parkinson's.   Sinemet 25/100 up to tid trial 2.   CT of the head to r/o NPH and other issues.     3.    RTC 3 months, sooner if problems     [place] Shawgo A. Felecia Shelling, MD, PhD [id number], [id number] AM Certified in Neurology, Clinical Neurophysiology, Sleep Medicine, Pain Medicine and Neuroimaging  [facility] [address], [place],  [place] [address]

## 2015-09-10 ENCOUNTER — Telehealth: Payer: Self-pay | Admitting: Neurology

## 2015-09-10 NOTE — Telephone Encounter (Signed)
Patients daughter is calling to schedule a CT scan for the patient which Dr. Felecia Shelling ordered. She says the patient would like to have it done this week because the daughter will be out of town next Monday & Tuesday and having surgery on 09-19-15.

## 2015-09-15 ENCOUNTER — Telehealth: Payer: Self-pay | Admitting: Neurology

## 2015-09-15 NOTE — Telephone Encounter (Signed)
Called and scheduled patient at [facility] and called the patient to inform her of appt.

## 2015-09-15 NOTE — Telephone Encounter (Addendum)
error 

## 2015-09-16 ENCOUNTER — Ambulatory Visit
Admission: RE | Admit: 2015-09-16 | Discharge: 2015-09-16 | Disposition: A | Discharge status: Home / Self Care | Payer: Medicare Other | Source: Ambulatory Visit | Attending: Neurology | Admitting: Neurology

## 2015-09-16 DIAGNOSIS — N3941 Urge incontinence: Secondary | ICD-10-CM

## 2015-09-16 DIAGNOSIS — R269 Unspecified abnormalities of gait and mobility: Secondary | ICD-10-CM

## 2015-09-18 NOTE — Telephone Encounter (Signed)
LMTC./fim 

## 2015-09-18 NOTE — Telephone Encounter (Signed)
-----   Message from Britt Bottom, MD sent at 09/18/2015  1:35 PM EDT ----- Please let her know that the CAT scan shows no changes compared to the one she had in 2015.

## 2015-09-19 ENCOUNTER — Telehealth: Payer: Self-pay | Admitting: Neurology

## 2015-09-19 NOTE — Telephone Encounter (Signed)
Pt returned call about results. She is requesting a call before 12p today.

## 2015-09-19 NOTE — Telephone Encounter (Signed)
I have spoken with Stacey Walker and per RAS, advised CT scan showed  no changes when compared to the one she had in 2015.  She verbalized understanding of same/fim

## 2015-10-05 ENCOUNTER — Encounter (HOSPITAL_COMMUNITY): Payer: Self-pay

## 2015-10-05 ENCOUNTER — Emergency Department (HOSPITAL_COMMUNITY)
Admission: EM | Admit: 2015-10-05 | Discharge: 2015-10-05 | Disposition: A | Discharge status: Home / Self Care | Payer: Medicare Other | Attending: Emergency Medicine | Admitting: Emergency Medicine

## 2015-10-05 ENCOUNTER — Emergency Department (HOSPITAL_COMMUNITY): Payer: Medicare Other

## 2015-10-05 DIAGNOSIS — I10 Essential (primary) hypertension: Secondary | ICD-10-CM | POA: Insufficient documentation

## 2015-10-05 DIAGNOSIS — Y939 Activity, unspecified: Secondary | ICD-10-CM | POA: Insufficient documentation

## 2015-10-05 DIAGNOSIS — G20 Parkinson's disease: Secondary | ICD-10-CM | POA: Diagnosis not present

## 2015-10-05 DIAGNOSIS — Y999 Unspecified external cause status: Secondary | ICD-10-CM | POA: Insufficient documentation

## 2015-10-05 DIAGNOSIS — W19XXXA Unspecified fall, initial encounter: Secondary | ICD-10-CM | POA: Diagnosis not present

## 2015-10-05 DIAGNOSIS — Y929 Unspecified place or not applicable: Secondary | ICD-10-CM | POA: Diagnosis not present

## 2015-10-05 DIAGNOSIS — R42 Dizziness and giddiness: Secondary | ICD-10-CM | POA: Diagnosis not present

## 2015-10-05 DIAGNOSIS — Z79899 Other long term (current) drug therapy: Secondary | ICD-10-CM | POA: Insufficient documentation

## 2015-10-05 DIAGNOSIS — Z7982 Long term (current) use of aspirin: Secondary | ICD-10-CM | POA: Diagnosis not present

## 2015-10-05 DIAGNOSIS — R630 Anorexia: Secondary | ICD-10-CM | POA: Diagnosis not present

## 2015-10-05 DIAGNOSIS — R531 Weakness: Secondary | ICD-10-CM | POA: Diagnosis not present

## 2015-10-05 DIAGNOSIS — I2510 Atherosclerotic heart disease of native coronary artery without angina pectoris: Secondary | ICD-10-CM | POA: Diagnosis not present

## 2015-10-05 DIAGNOSIS — Z8551 Personal history of malignant neoplasm of bladder: Secondary | ICD-10-CM | POA: Insufficient documentation

## 2015-10-05 DIAGNOSIS — Z87891 Personal history of nicotine dependence: Secondary | ICD-10-CM | POA: Insufficient documentation

## 2015-10-05 HISTORY — DX: Atherosclerotic heart disease of native coronary artery without angina pectoris: I25.10

## 2015-10-05 HISTORY — DX: Essential (primary) hypertension: I10

## 2015-10-05 HISTORY — DX: Parkinson's disease: G20

## 2015-10-05 HISTORY — DX: Unspecified glaucoma: H40.9

## 2015-10-05 HISTORY — DX: Parkinson's disease without dyskinesia, without mention of fluctuations: G20.A1

## 2015-10-05 LAB — COMPREHENSIVE METABOLIC PANEL
ALBUMIN: 2.8 g/dL — AB (ref 3.5–5.0)
ALT: 18 U/L (ref 14–54)
AST: 33 U/L (ref 15–41)
Alkaline Phosphatase: 70 U/L (ref 38–126)
Anion gap: 9 (ref 5–15)
BUN: 16 mg/dL (ref 6–20)
CHLORIDE: 99 mmol/L — AB (ref 101–111)
CO2: 22 mmol/L (ref 22–32)
Calcium: 8.7 mg/dL — ABNORMAL LOW (ref 8.9–10.3)
Creatinine, Ser: 0.76 mg/dL (ref 0.44–1.00)
GFR calc Af Amer: >60 mL/min (ref >60)
GLUCOSE: 141 mg/dL — AB (ref 65–99)
Potassium: 3.4 mmol/L — ABNORMAL LOW (ref 3.5–5.1)
Sodium: 130 mmol/L — ABNORMAL LOW (ref 135–145)
Total Bilirubin: 1.2 mg/dL (ref 0.3–1.2)
Total Protein: 5.9 g/dL — ABNORMAL LOW (ref 6.5–8.1)

## 2015-10-05 LAB — CBC WITH DIFFERENTIAL/PLATELET
Basophils Absolute: 0 10*3/uL (ref 0.0–0.1)
Basophils Relative: 0 %
EOS ABS: 0 10*3/uL (ref 0.0–0.7)
EOS PCT: 0 %
HCT: 40.7 % (ref 36.0–46.0)
Hemoglobin: 13.6 g/dL (ref 12.0–15.0)
LYMPHS ABS: 0.5 10*3/uL — AB (ref 0.7–4.0)
Lymphocytes Relative: 5 %
MCH: 29.2 pg (ref 26.0–34.0)
MCHC: 33.4 g/dL (ref 30.0–36.0)
MCV: 87.5 fL (ref 78.0–100.0)
MONO ABS: 0.7 10*3/uL (ref 0.1–1.0)
Monocytes Relative: 6 %
Neutro Abs: 10.6 10*3/uL — ABNORMAL HIGH (ref 1.7–7.7)
Neutrophils Relative %: 89 %
PLATELETS: 243 10*3/uL (ref 150–400)
RBC: 4.65 MIL/uL (ref 3.87–5.11)
RDW: 14.4 % (ref 11.5–15.5)
WBC: 11.9 10*3/uL — AB (ref 4.0–10.5)

## 2015-10-05 LAB — URINALYSIS, ROUTINE W REFLEX MICROSCOPIC
BILIRUBIN URINE: NEGATIVE
Glucose, UA: NEGATIVE mg/dL
KETONES UR: 15 mg/dL — AB
NITRITE: NEGATIVE
Protein, ur: 30 mg/dL — AB
Specific Gravity, Urine: 1.019 (ref 1.005–1.030)
pH: 6 (ref 5.0–8.0)

## 2015-10-05 LAB — URINE MICROSCOPIC-ADD ON

## 2015-10-05 MED ORDER — SODIUM CHLORIDE 0.9 % IV BOLUS (SEPSIS)
1000.0000 mL | Freq: Once | INTRAVENOUS | Status: AC
  Administered 2015-10-05: 1000 mL via INTRAVENOUS

## 2015-10-05 NOTE — Discharge Instructions (Signed)
As discussed, your evaluation today has been largely reassuring.  But, it is important that you monitor your condition carefully, and do not hesitate to return to the ED if you develop new, or concerning changes in your condition.  Otherwise, please follow-up with your physician for appropriate ongoing care.  In addition to your typical regimen for constipation you may consider using magnesium citrate This is available over-the-counter.

## 2015-10-05 NOTE — ED Notes (Signed)
Pt placed on BSC.

## 2015-10-05 NOTE — ED Notes (Signed)
Pt informed MD is awaiting urine specimen.

## 2015-10-05 NOTE — ED Notes (Signed)
Pt aware of need to obtain urine sample however is unable to go at this time

## 2015-10-05 NOTE — ED Triage Notes (Signed)
Per [facility]- Pt states fall 1 hour ago- NO LOC- [facility] cleared. Pt c/o of generalized weakness, dizziness and poor appetite. Pt c/o of nausea without vomiting. Zofran 4mg  this afternoon without relief. Pt ambulates with walker.

## 2015-10-05 NOTE — ED Notes (Signed)
Pt made aware of need for urine specimen 

## 2015-10-05 NOTE — ED Provider Notes (Signed)
[facility] DEPT Provider Note   CSN: [id number] Arrival date & time: 10/05/15  1710    History   Chief Complaint Chief Complaint  Patient presents with  . Fall  . Weakness  . Dizziness  . Anorexia    HPI Stacey Walker is a 82 y.o. female.  HPI patient presents with concern of generalized weakness. She notes that the weakness has been present for several days, but denies notable precipitant. She denies specific pain, states that she has typical arthritis discomfort, this is unchanged. Today, patient also had a fall, unclear cause, no program, just fell. She denies pain in her head, neck specifically. No recent medication changes, diet changes, activity changes.   Past Medical History:  Diagnosis Date  . Asymptomatic carotid artery stenosis    Dopplers January 2016: RICA 50-69% tortuous (no change; L ICA ~70%, tortuous (no change), bilateral subclavian arteries <50%  . Bladder cancer ([place])   . Cervical cancer ([place])   . Coronary artery disease   . Dyslipidemia    Managed by PCP. On statin  . Glaucoma   . Hypertension   . Hypertension, essential, benign   . Moderate Right-sided carotid artery disease    50-70% stenosis on the right carotid artery from 2011. Also noted less than 50% right and left subclavian artery stenosis.  . Parkinson's disease [facility])     Patient Active Problem List   Diagnosis Date Noted  . Urge incontinence 09/08/2015  . Tremor 09/08/2015  . Gait disorder 07/18/2014  . CAP (community acquired pneumonia) 06/30/2014  . Lung nodule 06/30/2014  . Elevated troponin 06/30/2014  . Pneumonia 06/30/2014  . Hypertension, essential, benign   . Dyslipidemia   . Bilateral carotid artery disease [facility])     Past Surgical History:  Procedure Laterality Date  . ABDOMINAL HYSTERECTOMY    . BLADDER TUMOR EXCISION    . Carotid Dopplers  July 2011   50-70% stenosis of the right carotid with <50% stenosis of both the right and left subclavian arteries.  .  CHOLECYSTECTOMY    . NM MYOVIEW LTD  August 2007   No ischemia or infarction  . TRANSTHORACIC ECHOCARDIOGRAM  August 7   Normal EF, impaired relaxation with mildly sclerotic aortic valve but no stenosis. Mild left atrial dilation.    OB History    No data available       Home Medications    Prior to Admission medications   Medication Sig Start Date End Date Taking? Authorizing Provider  aspirin EC 81 MG tablet Take 81 mg by mouth daily.   Yes Historical Provider, MD  carbidopa-levodopa (SINEMET IR) 25-100 MG tablet Take 1 tablet by mouth 3 (three) times daily.   Yes Historical Provider, MD  docusate sodium (COLACE) 100 MG capsule Take 100 mg by mouth at bedtime.   Yes Historical Provider, MD  ezetimibe-simvastatin (VYTORIN) 10-40 MG tablet Take 1 tablet by mouth daily.   Yes Historical Provider, MD  ipratropium (ATROVENT) 0.03 % nasal spray Place 1 spray into both nostrils 3 (three) times daily.    Yes Historical Provider, MD  LUMIGAN 0.01 % SOLN Place 1 drop into both eyes at bedtime.   Yes Historical Provider, MD  magnesium hydroxide (MILK OF MAGNESIA) 400 MG/5ML suspension Take 30 mLs by mouth daily as needed for mild constipation or moderate constipation.    Yes Historical Provider, MD  Multiple Vitamin (MULTIVITAMIN WITH MINERALS) TABS tablet Take 1 tablet by mouth daily.   Yes Historical Provider, MD  omega-3 acid ethyl esters (LOVAZA) 1 g capsule Take 1 g by mouth daily.   Yes Historical Provider, MD  psyllium (HYDROCIL/METAMUCIL) 95 % PACK Take 1 packet by mouth 2 (two) times daily.   Yes Historical Provider, MD  timolol (BETIMOL) 0.5 % ophthalmic solution Place 1 drop into both eyes daily.    Yes Historical Provider, MD  valsartan-hydrochlorothiazide (DIOVAN-HCT) 320-12.5 MG per tablet Take 1 tablet by mouth daily.   Yes Historical Provider, MD    Family History Family History  Problem Relation Age of Onset  . Dementia Mother   . Lung cancer Father     Social  History Social History  Substance Use Topics  . Smoking status: Former Smoker    Quit date: 03/08/1978  . Smokeless tobacco: Never Used  . Alcohol use Yes     Comment: occas     Allergies   Review of patient's allergies indicates no known allergies.   Review of Systems Review of Systems  Constitutional:       Per HPI, otherwise negative  HENT:       Per HPI, otherwise negative  Respiratory:       Per HPI, otherwise negative  Cardiovascular:       Per HPI, otherwise negative  Gastrointestinal: Negative for vomiting.  Endocrine:       Negative aside from HPI  Genitourinary:       Neg aside from HPI   Musculoskeletal:       Per HPI, otherwise negative  Skin: Negative.   Neurological: Positive for weakness. Negative for syncope.     Physical Exam Updated Vital Signs BP (!) 113/47 (BP Location: Right Arm)   Pulse 76   Temp 98 F (36.7 C) (Oral)   Resp 21   Ht 5\' 2"  (1.575 m)   Wt 133 lb (60.3 kg)   SpO2 99%   BMI 24.33 kg/m   Physical Exam  Constitutional: She is oriented to person, place, and time. She appears well-developed and well-nourished. No distress.  HENT:  Head: Normocephalic and atraumatic.  Eyes: Conjunctivae and EOM are normal.  Cardiovascular: Normal rate and regular rhythm.   Pulmonary/Chest: Effort normal and breath sounds normal. No stridor. No respiratory distress.  Abdominal: She exhibits no distension.  Musculoskeletal: She exhibits no edema.  Neurological: She is alert and oriented to person, place, and time. No cranial nerve deficit.  Patient moves all extremity spontaneously, has equal strength in all 4 extremities, has no facial asymmetry, speech is clear.   Skin: Skin is warm and dry.  Psychiatric: She has a normal mood and affect.  Nursing note and vitals reviewed.    ED Treatments / Results  Labs (all labs ordered are listed, but only abnormal results are displayed) Labs Reviewed  COMPREHENSIVE METABOLIC PANEL - Abnormal;  Notable for the following:       Result Value   Sodium 130 (*)    Potassium 3.4 (*)    Chloride 99 (*)    Glucose, Bld 141 (*)    Calcium 8.7 (*)    Total Protein 5.9 (*)    Albumin 2.8 (*)    All other components within normal limits  CBC WITH DIFFERENTIAL/PLATELET - Abnormal; Notable for the following:    WBC 11.9 (*)    Neutro Abs 10.6 (*)    Lymphs Abs 0.5 (*)    All other components within normal limits  URINALYSIS, ROUTINE W REFLEX MICROSCOPIC (NOT AT [facility]) - Abnormal; Notable for the following:  Color, Urine AMBER (*)    APPearance CLOUDY (*)    Hgb urine dipstick SMALL (*)    Ketones, ur 15 (*)    Protein, ur 30 (*)    Leukocytes, UA MODERATE (*)    All other components within normal limits  URINE MICROSCOPIC-ADD ON - Abnormal; Notable for the following:    Squamous Epithelial / LPF 0-5 (*)    Bacteria, UA FEW (*)    Casts HYALINE CASTS (*)    All other components within normal limits    EKG  EKG Interpretation  Date/Time:  Sunday October 05 2015 18:28:43 EDT Ventricular Rate:  90 PR Interval:    QRS Duration: 97 QT Interval:  358 QTC Calculation: 438 R Axis:   -52 Text Interpretation:  Sinus rhythm Left anterior fascicular block Borderline repolarization abnormality Baseline wander in lead(s) I III aVL V1 Abnormal ekg Confirmed by Carmin Muskrat  MD ([id number]) on 10/05/2015 7:07:48 PM       Radiology Dg Chest 2 View  Result Date: 10/05/2015 CLINICAL DATA:  [place] 1 hour ago. Generalized weakness and dizziness. EXAM: CHEST  2 VIEW COMPARISON:  Chest x-ray 06/30/2014 FINDINGS: The heart is within normal limits in size given the AP projection. There is tortuosity and calcification of the thoracic aorta. There are chronic emphysematous and bronchitic lung changes but no definite acute overlying pulmonary process. No pleural effusion or focal infiltrate. The bony thorax is intact. IMPRESSION: Chronic emphysematous and bronchitic lung changes but no definite acute  overlying pulmonary process. Electronically Signed   By: Marijo Sanes M.D.   On: 10/05/2015 18:35   Procedures Procedures (including critical care time) Pressure on arrival low normal, patient received IV fluids empirically. Medications Ordered in ED Medications  sodium chloride 0.9 % bolus 1,000 mL (1,000 mLs Intravenous New Bag/Given 10/05/15 1838)     Initial Impression / Assessment and Plan / ED Course  I have reviewed the triage vital signs and the nursing notes.  Pertinent labs & imaging results that were available during my care of the patient were reviewed by me and considered in my medical decision making (see chart for details).  Clinical Course    On repeat exam the patient is in no distress, is awake, alert. Vital signs now normal, blood pressure normalized. I discussed all findings with patient and to family members. We discussed suspicion of dehydration, and discussed the patient's history of new constipation. I advocated for new medications, following up with primary care.   Final Clinical Impressions(s) / ED Diagnoses  Elderly female presents with generalized weakness. Here she is awake and alert, borderline hypotensive, but otherwise hemodynamically stable. With some suspicion for dehydration versus infection, the patient had labs, and empiric fluids were started. Evaluation here is largely reassuring, though there is evidence for dehydration, with ketonuria. Patient improved medically, clinically with fluids, was discharged in stable condition to follow-up with primary care.    Carmin Muskrat, MD 10/05/15 2157

## 2015-10-08 ENCOUNTER — Telehealth: Payer: Self-pay | Admitting: Neurology

## 2015-10-08 ENCOUNTER — Encounter: Payer: Self-pay | Admitting: *Deleted

## 2015-10-08 NOTE — Telephone Encounter (Signed)
pts daughter called in about medication carbidopa-levodopa (SINEMET IR) 25-100 MG tablet. She thinks her mother is having more side effects than benefits. Pt went to ER on Monday due to weakness and fell. She wants to know if the dosage can be decreased. Pt is not feeling well in general. Please call and advise [phone]

## 2015-10-08 NOTE — Telephone Encounter (Signed)
Per vo by Dr. Felecia Shelling, reduce dose of Sinemet 25-100mg  back down to 0.5 tablet TID.  Spoke to [place], she verbalized understanding and will start the lower dose.

## 2015-10-13 ENCOUNTER — Emergency Department (HOSPITAL_COMMUNITY): Payer: Medicare Other

## 2015-10-13 ENCOUNTER — Encounter (HOSPITAL_COMMUNITY): Payer: Self-pay

## 2015-10-13 ENCOUNTER — Observation Stay (HOSPITAL_COMMUNITY)
Admission: EM | Admit: 2015-10-13 | Discharge: 2015-10-17 | Disposition: A | Discharge status: Skilled Nursing Facility | Payer: Medicare Other | Attending: Internal Medicine | Admitting: Internal Medicine

## 2015-10-13 DIAGNOSIS — R062 Wheezing: Secondary | ICD-10-CM

## 2015-10-13 DIAGNOSIS — E860 Dehydration: Secondary | ICD-10-CM | POA: Diagnosis not present

## 2015-10-13 DIAGNOSIS — Z7982 Long term (current) use of aspirin: Secondary | ICD-10-CM | POA: Diagnosis not present

## 2015-10-13 DIAGNOSIS — I739 Peripheral vascular disease, unspecified: Secondary | ICD-10-CM

## 2015-10-13 DIAGNOSIS — I779 Disorder of arteries and arterioles, unspecified: Secondary | ICD-10-CM | POA: Diagnosis not present

## 2015-10-13 DIAGNOSIS — R911 Solitary pulmonary nodule: Secondary | ICD-10-CM | POA: Diagnosis not present

## 2015-10-13 DIAGNOSIS — R778 Other specified abnormalities of plasma proteins: Secondary | ICD-10-CM | POA: Diagnosis not present

## 2015-10-13 DIAGNOSIS — E440 Moderate protein-calorie malnutrition: Secondary | ICD-10-CM | POA: Insufficient documentation

## 2015-10-13 DIAGNOSIS — I2510 Atherosclerotic heart disease of native coronary artery without angina pectoris: Secondary | ICD-10-CM | POA: Insufficient documentation

## 2015-10-13 DIAGNOSIS — K5900 Constipation, unspecified: Secondary | ICD-10-CM | POA: Diagnosis not present

## 2015-10-13 DIAGNOSIS — N390 Urinary tract infection, site not specified: Secondary | ICD-10-CM | POA: Diagnosis not present

## 2015-10-13 DIAGNOSIS — Z87891 Personal history of nicotine dependence: Secondary | ICD-10-CM | POA: Diagnosis not present

## 2015-10-13 DIAGNOSIS — I10 Essential (primary) hypertension: Secondary | ICD-10-CM | POA: Diagnosis not present

## 2015-10-13 DIAGNOSIS — Z79899 Other long term (current) drug therapy: Secondary | ICD-10-CM | POA: Insufficient documentation

## 2015-10-13 DIAGNOSIS — Z6825 Body mass index (BMI) 25.0-25.9, adult: Secondary | ICD-10-CM | POA: Insufficient documentation

## 2015-10-13 DIAGNOSIS — Z66 Do not resuscitate: Secondary | ICD-10-CM | POA: Insufficient documentation

## 2015-10-13 DIAGNOSIS — R059 Cough, unspecified: Secondary | ICD-10-CM

## 2015-10-13 DIAGNOSIS — H409 Unspecified glaucoma: Secondary | ICD-10-CM | POA: Insufficient documentation

## 2015-10-13 DIAGNOSIS — E876 Hypokalemia: Secondary | ICD-10-CM | POA: Diagnosis present

## 2015-10-13 DIAGNOSIS — R0602 Shortness of breath: Secondary | ICD-10-CM

## 2015-10-13 DIAGNOSIS — E785 Hyperlipidemia, unspecified: Secondary | ICD-10-CM | POA: Diagnosis present

## 2015-10-13 DIAGNOSIS — G20 Parkinson's disease: Secondary | ICD-10-CM | POA: Diagnosis not present

## 2015-10-13 DIAGNOSIS — R531 Weakness: Secondary | ICD-10-CM | POA: Diagnosis present

## 2015-10-13 DIAGNOSIS — R7989 Other specified abnormal findings of blood chemistry: Secondary | ICD-10-CM

## 2015-10-13 DIAGNOSIS — R109 Unspecified abdominal pain: Secondary | ICD-10-CM

## 2015-10-13 DIAGNOSIS — R05 Cough: Secondary | ICD-10-CM

## 2015-10-13 LAB — URINALYSIS, ROUTINE W REFLEX MICROSCOPIC
Bilirubin Urine: NEGATIVE
GLUCOSE, UA: NEGATIVE mg/dL
KETONES UR: NEGATIVE mg/dL
Nitrite: NEGATIVE
PH: 6.5 (ref 5.0–8.0)
Protein, ur: NEGATIVE mg/dL
Specific Gravity, Urine: 1.013 (ref 1.005–1.030)

## 2015-10-13 LAB — BASIC METABOLIC PANEL
Anion gap: 9 (ref 5–15)
BUN: 9 mg/dL (ref 6–20)
CHLORIDE: 96 mmol/L — AB (ref 101–111)
CO2: 27 mmol/L (ref 22–32)
Calcium: 9.3 mg/dL (ref 8.9–10.3)
Creatinine, Ser: 0.54 mg/dL (ref 0.44–1.00)
GFR calc Af Amer: >60 mL/min (ref >60)
GFR calc non Af Amer: >60 mL/min (ref >60)
Glucose, Bld: 105 mg/dL — ABNORMAL HIGH (ref 65–99)
POTASSIUM: 2.9 mmol/L — AB (ref 3.5–5.1)
SODIUM: 132 mmol/L — AB (ref 135–145)

## 2015-10-13 LAB — URINE MICROSCOPIC-ADD ON

## 2015-10-13 LAB — MRSA PCR SCREENING: MRSA by PCR: NEGATIVE

## 2015-10-13 LAB — TROPONIN I
TROPONIN I: 0.17 ng/mL — AB (ref <0.03)
Troponin I: 0.16 ng/mL (ref <0.03)

## 2015-10-13 LAB — CBC
HEMATOCRIT: 38.5 % (ref 36.0–46.0)
Hemoglobin: 12.8 g/dL (ref 12.0–15.0)
MCH: 28.7 pg (ref 26.0–34.0)
MCHC: 33.2 g/dL (ref 30.0–36.0)
MCV: 86.3 fL (ref 78.0–100.0)
Platelets: 393 10*3/uL (ref 150–400)
RBC: 4.46 MIL/uL (ref 3.87–5.11)
RDW: 14.3 % (ref 11.5–15.5)
WBC: 9.4 10*3/uL (ref 4.0–10.5)

## 2015-10-13 LAB — PROTIME-INR
INR: 1.07
PROTHROMBIN TIME: 14 s (ref 11.4–15.2)

## 2015-10-13 LAB — TSH: TSH: 2.574 u[IU]/mL (ref 0.350–4.500)

## 2015-10-13 MED ORDER — HEPARIN SODIUM (PORCINE) 5000 UNIT/ML IJ SOLN
5000.0000 [IU] | Freq: Three times a day (TID) | INTRAMUSCULAR | Status: DC
  Administered 2015-10-16 – 2015-10-17 (×3): 5000 [IU] via SUBCUTANEOUS
  Filled 2015-10-13 (×6): qty 1

## 2015-10-13 MED ORDER — ASPIRIN 81 MG PO CHEW
81.0000 mg | CHEWABLE_TABLET | Freq: Every day | ORAL | Status: DC
  Administered 2015-10-13 – 2015-10-17 (×5): 81 mg via ORAL
  Filled 2015-10-13 (×6): qty 1

## 2015-10-13 MED ORDER — DEXTROSE 5 % IV SOLN
1.0000 g | INTRAVENOUS | Status: DC
  Administered 2015-10-14 – 2015-10-15 (×2): 1 g via INTRAVENOUS
  Filled 2015-10-13 (×2): qty 10

## 2015-10-13 MED ORDER — OMEGA-3-ACID ETHYL ESTERS 1 G PO CAPS
1.0000 g | ORAL_CAPSULE | Freq: Every day | ORAL | Status: DC
  Administered 2015-10-13 – 2015-10-15 (×3): 1 g via ORAL
  Filled 2015-10-13 (×3): qty 1

## 2015-10-13 MED ORDER — ONDANSETRON HCL 4 MG PO TABS: 4.0000 mg | ORAL_TABLET | Freq: Four times a day (QID) | ORAL | Status: DC | PRN

## 2015-10-13 MED ORDER — ONDANSETRON HCL 4 MG/2ML IJ SOLN
4.0000 mg | Freq: Four times a day (QID) | INTRAMUSCULAR | Status: DC | PRN
  Administered 2015-10-14: 4 mg via INTRAVENOUS
  Filled 2015-10-13 (×2): qty 2

## 2015-10-13 MED ORDER — SODIUM CHLORIDE 0.9% FLUSH
3.0000 mL | Freq: Two times a day (BID) | INTRAVENOUS | Status: DC
  Administered 2015-10-14 – 2015-10-16 (×2): 3 mL via INTRAVENOUS

## 2015-10-13 MED ORDER — MAGNESIUM HYDROXIDE 400 MG/5ML PO SUSP
30.0000 mL | Freq: Every day | ORAL | Status: DC | PRN
  Administered 2015-10-16: 30 mL via ORAL
  Filled 2015-10-13: qty 30

## 2015-10-13 MED ORDER — POTASSIUM CHLORIDE CRYS ER 20 MEQ PO TBCR
40.0000 meq | EXTENDED_RELEASE_TABLET | Freq: Once | ORAL | Status: AC
  Administered 2015-10-13: 40 meq via ORAL
  Filled 2015-10-13: qty 2

## 2015-10-13 MED ORDER — CARBIDOPA-LEVODOPA 25-100 MG PO TABS
0.5000 | ORAL_TABLET | Freq: Three times a day (TID) | ORAL | Status: DC
  Administered 2015-10-13 – 2015-10-17 (×13): 0.5 via ORAL
  Filled 2015-10-13 (×13): qty 1

## 2015-10-13 MED ORDER — ACETAMINOPHEN 325 MG PO TABS
650.0000 mg | ORAL_TABLET | Freq: Four times a day (QID) | ORAL | Status: DC | PRN
  Administered 2015-10-14 – 2015-10-17 (×5): 650 mg via ORAL
  Filled 2015-10-13 (×5): qty 2

## 2015-10-13 MED ORDER — LATANOPROST 0.005 % OP SOLN
1.0000 [drp] | Freq: Every day | OPHTHALMIC | Status: DC
  Administered 2015-10-13 – 2015-10-16 (×4): 1 [drp] via OPHTHALMIC
  Filled 2015-10-13: qty 2.5

## 2015-10-13 MED ORDER — POTASSIUM CHLORIDE CRYS ER 20 MEQ PO TBCR
40.0000 meq | EXTENDED_RELEASE_TABLET | Freq: Four times a day (QID) | ORAL | Status: DC
  Administered 2015-10-13 – 2015-10-14 (×2): 40 meq via ORAL
  Filled 2015-10-13 (×2): qty 2

## 2015-10-13 MED ORDER — HYDROCODONE-ACETAMINOPHEN 5-325 MG PO TABS
1.0000 | ORAL_TABLET | Freq: Four times a day (QID) | ORAL | Status: DC | PRN
  Administered 2015-10-15 – 2015-10-16 (×2): 1 via ORAL
  Filled 2015-10-13 (×2): qty 1

## 2015-10-13 MED ORDER — TIMOLOL HEMIHYDRATE 0.5 % OP SOLN: 1.0000 [drp] | Freq: Every day | OPHTHALMIC | Status: DC

## 2015-10-13 MED ORDER — METOPROLOL TARTRATE 25 MG PO TABS
25.0000 mg | ORAL_TABLET | Freq: Two times a day (BID) | ORAL | Status: DC
  Administered 2015-10-14 – 2015-10-17 (×7): 25 mg via ORAL
  Filled 2015-10-13 (×7): qty 1

## 2015-10-13 MED ORDER — DOCUSATE SODIUM 100 MG PO CAPS: 100.0000 mg | ORAL_CAPSULE | Freq: Every day | ORAL | Status: DC

## 2015-10-13 MED ORDER — DOCUSATE SODIUM 100 MG PO CAPS
200.0000 mg | ORAL_CAPSULE | Freq: Two times a day (BID) | ORAL | Status: DC
  Administered 2015-10-13 – 2015-10-15 (×5): 200 mg via ORAL
  Filled 2015-10-13 (×6): qty 2

## 2015-10-13 MED ORDER — ACETAMINOPHEN 650 MG RE SUPP: 650.0000 mg | Freq: Four times a day (QID) | RECTAL | Status: DC | PRN

## 2015-10-13 MED ORDER — BISACODYL 10 MG RE SUPP
10.0000 mg | Freq: Every day | RECTAL | Status: DC
  Administered 2015-10-14 – 2015-10-17 (×3): 10 mg via RECTAL
  Filled 2015-10-13 (×4): qty 1

## 2015-10-13 MED ORDER — SODIUM CHLORIDE 0.9 % IV SOLN
INTRAVENOUS | Status: AC
  Administered 2015-10-13 – 2015-10-14 (×2): via INTRAVENOUS

## 2015-10-13 MED ORDER — EZETIMIBE-SIMVASTATIN 10-40 MG PO TABS
1.0000 | ORAL_TABLET | Freq: Every day | ORAL | Status: DC
  Administered 2015-10-14 – 2015-10-16 (×3): 1 via ORAL
  Filled 2015-10-13 (×5): qty 1

## 2015-10-13 MED ORDER — DEXTROSE 5 % IV SOLN
1.0000 g | Freq: Once | INTRAVENOUS | Status: AC
  Administered 2015-10-13: 1 g via INTRAVENOUS
  Filled 2015-10-13: qty 10

## 2015-10-13 MED ORDER — TIMOLOL MALEATE 0.5 % OP SOLN
1.0000 [drp] | Freq: Every day | OPHTHALMIC | Status: DC
  Administered 2015-10-14 – 2015-10-17 (×4): 1 [drp] via OPHTHALMIC
  Filled 2015-10-13 (×2): qty 5

## 2015-10-13 MED ORDER — MAGNESIUM SULFATE IN D5W 1-5 GM/100ML-% IV SOLN
1.0000 g | Freq: Once | INTRAVENOUS | Status: AC
  Administered 2015-10-13: 1 g via INTRAVENOUS
  Filled 2015-10-13: qty 100

## 2015-10-13 NOTE — H&P (Signed)
TRH H&P   Patient Demographics:    Stacey Walker, is a 82 y.o. female  MRN: [id number]   DOB - [birth year]  Admit Date - 10/13/2015  Outpatient Primary MD for the patient is Donnajean Lopes, MD   Patient coming from: Home  Chief Complaint  Patient presents with  . Weakness      HPI:    Stacey Walker  is a 82 y.o. female, With history of essential hypertension, bladder and cervical cancer, dyslipidemia, glaucoma, mention of CAD in the chart but patient denies any history of the same who still lives at home uses a walker to ambulate comes in with 5-7 day history of generalized weakness, some dysuria, she came to the ER about 1 week ago and was discharged with oral medications without much benefit. She comes in with persisting symptoms. This time in the ER UA again consistent with UTI, she was also found to be dehydrated with hypokalemia, mild nonspecific troponin elevation without any chest pain or shortness of breath and I was called to admit. Besides above symptoms patient also condones to constipation last bowel movement 3 days ago, she has no other subjective complaints. No chest pain.    Review of systems:    In addition to the HPI above,  No Fever-chills, No Headache, No changes with Vision or hearing, No problems swallowing food or Liquids, No Chest pain, Cough or Shortness of Breath, No Abdominal pain, No Nausea or Vommitting, She feels constipated No Blood in stool or Urine, No dysuria, No new skin rashes or bruises, No new joints pains-aches,  No new weakness, tingling, numbness in any extremity, Generalized weakness No recent weight gain or loss, No polyuria, polydypsia or polyphagia, No significant  Mental Stressors.  A full 10 point Review of Systems was done, except as stated above, all other Review of Systems were negative.   With Past History of the following :    Past Medical History:  Diagnosis Date  . Asymptomatic carotid artery stenosis    Dopplers January 2016: RICA 50-69% tortuous (no change; L ICA ~70%, tortuous (no change), bilateral subclavian arteries <50%  . Bladder cancer ([place])   . Cervical cancer ([place])   . Coronary artery disease   . Dyslipidemia    Managed by PCP. On statin  . Glaucoma   . Hypertension   .  Hypertension, essential, benign   . Moderate Right-sided carotid artery disease    50-70% stenosis on the right carotid artery from 2011. Also noted less than 50% right and left subclavian artery stenosis.  . Parkinson's disease [facility])       Past Surgical History:  Procedure Laterality Date  . ABDOMINAL HYSTERECTOMY    . BLADDER TUMOR EXCISION    . Carotid Dopplers  July 2011   50-70% stenosis of the right carotid with <50% stenosis of both the right and left subclavian arteries.  . CHOLECYSTECTOMY    . NM MYOVIEW LTD  August 2007   No ischemia or infarction  . TRANSTHORACIC ECHOCARDIOGRAM  August 7   Normal EF, impaired relaxation with mildly sclerotic aortic valve but no stenosis. Mild left atrial dilation.      Social History:     Social History  Substance Use Topics  . Smoking status: Former Smoker    Quit date: 03/08/1978  . Smokeless tobacco: Never Used  . Alcohol use Yes     Comment: occas         Family History :     Family History  Problem Relation Age of Onset  . Dementia Mother   . Lung cancer Father        Home Medications:   Prior to Admission medications   Medication Sig Start Date End Date Taking? Authorizing Provider  acetaminophen (TYLENOL) 325 MG tablet Take 650 mg by mouth every 6 (six) hours as needed for moderate pain.   Yes Historical Provider, MD  aspirin EC 81 MG tablet Take 81 mg by mouth daily.   Yes  Historical Provider, MD  carbidopa-levodopa (SINEMET IR) 25-100 MG tablet Take 0.5 tablets by mouth 3 (three) times daily.    Yes Historical Provider, MD  docusate sodium (COLACE) 100 MG capsule Take 100 mg by mouth at bedtime.   Yes Historical Provider, MD  ezetimibe-simvastatin (VYTORIN) 10-40 MG tablet Take 1 tablet by mouth daily.   Yes Historical Provider, MD  ipratropium (ATROVENT) 0.03 % nasal spray Place 1 spray into both nostrils 3 (three) times daily.    Yes Historical Provider, MD  LUMIGAN 0.01 % SOLN Place 1 drop into both eyes at bedtime.   Yes Historical Provider, MD  magnesium hydroxide (MILK OF MAGNESIA) 400 MG/5ML suspension Take 30 mLs by mouth daily as needed for mild constipation or moderate constipation.    Yes Historical Provider, MD  Multiple Vitamin (MULTIVITAMIN WITH MINERALS) TABS tablet Take 1 tablet by mouth daily.   Yes Historical Provider, MD  omega-3 acid ethyl esters (LOVAZA) 1 g capsule Take 1 g by mouth daily.   Yes Historical Provider, MD  psyllium (HYDROCIL/METAMUCIL) 95 % PACK Take 1 packet by mouth 2 (two) times daily as needed for mild constipation.    Yes Historical Provider, MD  timolol (BETIMOL) 0.5 % ophthalmic solution Place 1 drop into both eyes daily.    Yes Historical Provider, MD  valsartan-hydrochlorothiazide (DIOVAN-HCT) 320-12.5 MG per tablet Take 1 tablet by mouth daily.   Yes Historical Provider, MD  ciprofloxacin (CIPRO) 500 MG tablet Take 1 tablet by mouth 2 (two) times daily. 10/09/15   Historical Provider, MD     Allergies:    No Known Allergies   Physical Exam:   Vitals  Blood pressure 144/55, pulse 76, temperature 98.9 F (37.2 C), temperature source Oral, resp. rate 18, height 5' 2.5" (1.588 m), weight 61.2 kg (135 lb), SpO2 96 %.   1. General  Elderly white female lying in bed in NAD,     2. Normal affect and insight, Not Suicidal or Homicidal, Awake Alert, Oriented X 3.  3. No F.N deficits, ALL C.Nerves Intact, Strength 5/5 all  4 extremities, Sensation intact all 4 extremities, Plantars down going.  4. Ears and Eyes appear Normal, Conjunctivae clear, PERRLA. Moist Oral Mucosa.  5. Supple Neck, No JVD, No cervical lymphadenopathy appriciated, No Carotid Bruits.  6. Symmetrical Chest wall movement, Good air movement bilaterally, CTAB.  7. RRR, No Gallops, Rubs or Murmurs, No Parasternal Heave.  8. Positive Bowel Sounds, Abdomen Soft, No tenderness, No organomegaly appriciated,No rebound -guarding or rigidity.  9.  No Cyanosis, Normal Skin Turgor, No Skin Rash or Bruise.  10. Good muscle tone,  joints appear normal , no effusions, Normal ROM.  11. No Palpable Lymph Nodes in Neck or Axillae      Data Review:    CBC  Recent Labs Lab 10/13/15 1222  WBC 9.4  HGB 12.8  HCT 38.5  PLT 393  MCV 86.3  MCH 28.7  MCHC 33.2  RDW 14.3   ------------------------------------------------------------------------------------------------------------------  Chemistries   Recent Labs Lab 10/13/15 1222  NA 132*  K 2.9*  CL 96*  CO2 27  GLUCOSE 105*  BUN 9  CREATININE 0.54  CALCIUM 9.3   ------------------------------------------------------------------------------------------------------------------ estimated creatinine clearance is 39.4 mL/min (by C-G formula based on SCr of 0.8 mg/dL). ------------------------------------------------------------------------------------------------------------------ No results for input(s): TSH, T4TOTAL, T3FREE, THYROIDAB in the last 72 hours.  Invalid input(s): FREET3  Coagulation profile No results for input(s): INR, PROTIME in the last 168 hours. ------------------------------------------------------------------------------------------------------------------- No results for input(s): DDIMER in the last 72 hours. -------------------------------------------------------------------------------------------------------------------  Cardiac Enzymes  Recent Labs Lab  10/13/15 1222  TROPONINI 0.16*   ------------------------------------------------------------------------------------------------------------------    Component Value Date/Time   BNP 133.7 (H) 06/30/2014 1030     ---------------------------------------------------------------------------------------------------------------  Urinalysis    Component Value Date/Time   COLORURINE YELLOW 10/13/2015 1307   APPEARANCEUR CLEAR 10/13/2015 1307   LABSPEC 1.013 10/13/2015 1307   PHURINE 6.5 10/13/2015 1307   GLUCOSEU NEGATIVE 10/13/2015 1307   HGBUR TRACE (A) 10/13/2015 1307   BILIRUBINUR NEGATIVE 10/13/2015 1307   KETONESUR NEGATIVE 10/13/2015 1307   PROTEINUR NEGATIVE 10/13/2015 1307   UROBILINOGEN 0.2 06/30/2014 1015   NITRITE NEGATIVE 10/13/2015 1307   LEUKOCYTESUR SMALL (A) 10/13/2015 1307    ----------------------------------------------------------------------------------------------------------------   Imaging Results:    No results found.  My personal review of EKG: Rhythm NSR, Rate  65 /min, non specific ST changes   Assessment & Plan:     1. Generalized weakness caused by a combination of UTI and dehydration. Will be admitted, urine cultures, IV Rocephin, IV fluids, PT eval.  2. Hypokalemia. Due to HCTZ, hold medication, replace potassium and recheck in the morning with magnesium levels.  3. Nonspecific troponin elevation. No chest pain or shortness of breath, EKG nonacute, does have history of CAD in the chart but patient denies knowing any history of CAD. Continue aspirin, Trental troponin, check echocardiogram to evaluate wall motion and EF. Add low-dose beta blocker and continue home dose statin.  4. Hypertension. Hold ARB and HCTZ combination due to dehydration and hypokalemia, low-dose beta blocker and monitor.  5. Glaucoma. Continue eyedrops.  6. Dyslipidemia. Continue home dose statin.  7. Constipation. Placed on bowel regimen.   DVT Prophylaxis Heparin     AM Labs Ordered, also please review Full Orders  Family Communication: Admission, patients condition and plan of care including tests  being ordered have been discussed with the patient  who indicates understanding and agree with the plan and Code Status.  Code Status DNR  Likely DC to  Home 1 - 2 days  Condition fair  Consults called: None   Admission status: Obs  Time spent in minutes : 35   Eveleen Mcnear K M.D on 10/13/2015 at 3:27 PM  Between 7am to 7pm - Pager - [phone]. After 7pm go to [email or web address] - password [facility]  [facility] - Office  [phone]

## 2015-10-13 NOTE — ED Provider Notes (Addendum)
[place] DEPT Provider Note   CSN: [id number] Arrival date & time: 10/13/15  1108  First Provider Contact:  None       History   Chief Complaint Chief Complaint  Patient presents with  . Weakness    HPI Stacey Walker is a 82 y.o. female.  Patient c/o feeling generally weak for the past week.  Symptoms moderate, persistent, without specific exacerbating or alleviating factors. States was seen in ED for same, without specific dx. Denies any focal or unilateral numbness or weakness. No loss of, or change in, normal functional ability. Denies trauma or falls. No faintness or dizziness. Denies any focal pain. No headaches. No chest pain or discomfort. No sob or unusual doe or fatigue. No abd pain. No dysuria or gu c/o. No extremity pain or swelling. No fever or chills. Compliant w normal meds.       The history is provided by the patient and the EMS personnel.  Weakness  Pertinent negatives include no chest pain, no abdominal pain, no headaches and no shortness of breath.    Past Medical History:  Diagnosis Date  . Asymptomatic carotid artery stenosis    Dopplers January 2016: RICA 50-69% tortuous (no change; L ICA ~70%, tortuous (no change), bilateral subclavian arteries <50%  . Bladder cancer ([place])   . Cervical cancer ([place])   . Coronary artery disease   . Dyslipidemia    Managed by PCP. On statin  . Glaucoma   . Hypertension   . Hypertension, essential, benign   . Moderate Right-sided carotid artery disease    50-70% stenosis on the right carotid artery from 2011. Also noted less than 50% right and left subclavian artery stenosis.  . Parkinson's disease [facility])     Patient Active Problem List   Diagnosis Date Noted  . Urge incontinence 09/08/2015  . Tremor 09/08/2015  . Gait disorder 07/18/2014  . CAP (community acquired pneumonia) 06/30/2014  . Lung nodule 06/30/2014  . Elevated troponin 06/30/2014  . Pneumonia 06/30/2014  . Hypertension, essential, benign     . Dyslipidemia   . Bilateral carotid artery disease [facility])     Past Surgical History:  Procedure Laterality Date  . ABDOMINAL HYSTERECTOMY    . BLADDER TUMOR EXCISION    . Carotid Dopplers  July 2011   50-70% stenosis of the right carotid with <50% stenosis of both the right and left subclavian arteries.  . CHOLECYSTECTOMY    . NM MYOVIEW LTD  August 2007   No ischemia or infarction  . TRANSTHORACIC ECHOCARDIOGRAM  August 7   Normal EF, impaired relaxation with mildly sclerotic aortic valve but no stenosis. Mild left atrial dilation.    OB History    No data available       Home Medications    Prior to Admission medications   Medication Sig Start Date End Date Taking? Authorizing Provider  aspirin EC 81 MG tablet Take 81 mg by mouth daily.    Historical Provider, MD  carbidopa-levodopa (SINEMET IR) 25-100 MG tablet Take 1 tablet by mouth 3 (three) times daily.    Historical Provider, MD  docusate sodium (COLACE) 100 MG capsule Take 100 mg by mouth at bedtime.    Historical Provider, MD  ezetimibe-simvastatin (VYTORIN) 10-40 MG tablet Take 1 tablet by mouth daily.    Historical Provider, MD  ipratropium (ATROVENT) 0.03 % nasal spray Place 1 spray into both nostrils 3 (three) times daily.     Historical Provider, MD  LUMIGAN 0.01 %  SOLN Place 1 drop into both eyes at bedtime.    Historical Provider, MD  magnesium hydroxide (MILK OF MAGNESIA) 400 MG/5ML suspension Take 30 mLs by mouth daily as needed for mild constipation or moderate constipation.     Historical Provider, MD  Multiple Vitamin (MULTIVITAMIN WITH MINERALS) TABS tablet Take 1 tablet by mouth daily.    Historical Provider, MD  omega-3 acid ethyl esters (LOVAZA) 1 g capsule Take 1 g by mouth daily.    Historical Provider, MD  psyllium (HYDROCIL/METAMUCIL) 95 % PACK Take 1 packet by mouth 2 (two) times daily.    Historical Provider, MD  timolol (BETIMOL) 0.5 % ophthalmic solution Place 1 drop into both eyes daily.      Historical Provider, MD  valsartan-hydrochlorothiazide (DIOVAN-HCT) 320-12.5 MG per tablet Take 1 tablet by mouth daily.    Historical Provider, MD    Family History Family History  Problem Relation Age of Onset  . Dementia Mother   . Lung cancer Father     Social History Social History  Substance Use Topics  . Smoking status: Former Smoker    Quit date: 03/08/1978  . Smokeless tobacco: Never Used  . Alcohol use Yes     Comment: occas     Allergies   Review of patient's allergies indicates no known allergies.   Review of Systems Review of Systems  Constitutional: Negative for chills and fever.  HENT: Negative for sore throat.   Eyes: Negative for visual disturbance.  Respiratory: Negative for cough and shortness of breath.   Cardiovascular: Negative for chest pain, palpitations and leg swelling.  Gastrointestinal: Positive for constipation. Negative for abdominal pain, diarrhea and vomiting.       States hx constipation in past, last bm 2 days ago.   Endocrine: Negative for polyuria.  Genitourinary: Negative for dysuria and flank pain.  Musculoskeletal: Negative for back pain and neck pain.  Skin: Negative for rash.  Neurological: Positive for weakness. Negative for speech difficulty, numbness and headaches.  Hematological: Does not bruise/bleed easily.  Psychiatric/Behavioral: Negative for confusion.     Physical Exam Updated Vital Signs BP 130/75 (BP Location: Right Arm)   Pulse 67   Temp 98.9 F (37.2 C) (Oral)   Resp 16   Ht 5' 2.5" (1.588 m)   Wt 61.2 kg   SpO2 96%   BMI 24.30 kg/m   Physical Exam  Constitutional: She is oriented to person, place, and time. She appears well-developed and well-nourished. No distress.  HENT:  Head: Atraumatic.  Mouth/Throat: Oropharynx is clear and moist.  Eyes: Conjunctivae are normal. Pupils are equal, round, and reactive to light. No scleral icterus.  Neck: Neck supple. No tracheal deviation present. No thyromegaly  present.  No bruits  Cardiovascular: Normal rate, regular rhythm, normal heart sounds and intact distal pulses.   No murmur heard. Pulmonary/Chest: Effort normal. No respiratory distress.  Abdominal: Soft. Normal appearance and bowel sounds are normal. She exhibits no distension and no mass. There is no tenderness. There is no rebound and no guarding. No hernia.  Genitourinary:  Genitourinary Comments: No cva tenderness  Musculoskeletal: She exhibits no edema.  Neurological: She is alert and oriented to person, place, and time.  Speech clear/fluent. Motor intact bil.  stre 5/5. Ambulates w steady gait.   Skin: Skin is warm and dry. No rash noted. She is not diaphoretic. No erythema.  Psychiatric: She has a normal mood and affect.  Nursing note and vitals reviewed.    ED Treatments /  Results  Labs (all labs ordered are listed, but only abnormal results are displayed)  Results for orders placed or performed during the hospital encounter of [id number]  Basic metabolic panel  Result Value Ref Range   Sodium 132 (L) 135 - 145 mmol/L   Potassium 2.9 (L) 3.5 - 5.1 mmol/L   Chloride 96 (L) 101 - 111 mmol/L   CO2 27 22 - 32 mmol/L   Glucose, Bld 105 (H) 65 - 99 mg/dL   BUN 9 6 - 20 mg/dL   Creatinine, Ser 0.54 0.44 - 1.00 mg/dL   Calcium 9.3 8.9 - 10.3 mg/dL   GFR calc non Af Amer >60 >60 mL/min   GFR calc Af Amer >60 >60 mL/min   Anion gap 9 5 - 15  CBC  Result Value Ref Range   WBC 9.4 4.0 - 10.5 K/uL   RBC 4.46 3.87 - 5.11 MIL/uL   Hemoglobin 12.8 12.0 - 15.0 g/dL   HCT 38.5 36.0 - 46.0 %   MCV 86.3 78.0 - 100.0 fL   MCH 28.7 26.0 - 34.0 pg   MCHC 33.2 30.0 - 36.0 g/dL   RDW 14.3 11.5 - 15.5 %   Platelets 393 150 - 400 K/uL  Urinalysis, Routine w reflex microscopic (not at [facility])  Result Value Ref Range   Color, Urine YELLOW YELLOW   APPearance CLEAR CLEAR   Specific Gravity, Urine 1.013 1.005 - 1.030   pH 6.5 5.0 - 8.0   Glucose, UA NEGATIVE NEGATIVE mg/dL   Hgb urine  dipstick TRACE (A) NEGATIVE   Bilirubin Urine NEGATIVE NEGATIVE   Ketones, ur NEGATIVE NEGATIVE mg/dL   Protein, ur NEGATIVE NEGATIVE mg/dL   Nitrite NEGATIVE NEGATIVE   Leukocytes, UA SMALL (A) NEGATIVE  Troponin I  Result Value Ref Range   Troponin I 0.16 (HH) <0.03 ng/mL  Urine microscopic-add on  Result Value Ref Range   Squamous Epithelial / LPF 6-30 (A) NONE SEEN   WBC, UA 6-30 0 - 5 WBC/hpf   RBC / HPF 0-5 0 - 5 RBC/hpf   Bacteria, UA FEW (A) NONE SEEN   Dg Chest 2 View  Result Date: 10/05/2015 CLINICAL DATA:  [place] 1 hour ago. Generalized weakness and dizziness. EXAM: CHEST  2 VIEW COMPARISON:  Chest x-ray 06/30/2014 FINDINGS: The heart is within normal limits in size given the AP projection. There is tortuosity and calcification of the thoracic aorta. There are chronic emphysematous and bronchitic lung changes but no definite acute overlying pulmonary process. No pleural effusion or focal infiltrate. The bony thorax is intact. IMPRESSION: Chronic emphysematous and bronchitic lung changes but no definite acute overlying pulmonary process. Electronically Signed   By: Marijo Sanes M.D.   On: 10/05/2015 18:35     EKG  EKG Interpretation  Date/Time:  Monday October 13 2015 11:42:18 EDT Ventricular Rate:  65 PR Interval:    QRS Duration: 113 QT Interval:  421 QTC Calculation: 438 R Axis:   -32 Text Interpretation:  Sinus rhythm LVH with secondary repolarization abnormality No significant change since last tracing Confirmed by Ashok Cordia  MD, Lennette Bihari ([id number]) on 10/13/2015 11:50:31 AM Also confirmed by Ashok Cordia  MD, Lennette Bihari ([id number]), editor [place] CT, Leda Gauze [phone])  on 10/13/2015 12:00:40 PM       Radiology No results found.  Procedures Procedures (including critical care time)  Medications Ordered in ED Medications - No data to display   Initial Impression / Assessment and Plan / ED Course  I have reviewed the  triage vital signs and the nursing notes.  Pertinent labs & imaging  results that were available during my care of the patient were reviewed by me and considered in my medical decision making (see chart for details).  Clinical Course    Iv ns. Labs.  Reviewed nursing notes and prior charts for additional history.   On review recent chart/ED visit, during that visit pt had ua with tntc wbc, few bact - no urine culture done that visit.  On todays ua, 6-30 wbc - will cx.  Also from todays labs, trop elevated .16. No chest pain.   Given elderly patient with weakness, elev trop, possible uti, will admit for repeat trop, f/u cx, observation/repeat assessment.   1500 daughter arrives, reports fevers at home, usu in evening to 101.  Urine is being cxd, given report fevers, will tx while cxs pending - cxr also added to workup.   Discussed w Dr Candiss Norse - will place temp orders to tele.  Final Clinical Impressions(s) / ED Diagnoses   Final diagnoses:  None    New Prescriptions New Prescriptions   No medications on file     Lajean Saver, MD 10/13/15 [place], MD 10/13/15 1505

## 2015-10-13 NOTE — ED Triage Notes (Signed)
Per EMS- patient c/o weakness. Patient's family reported that the patient has not been eating or drinking properly. Patient was admitted for the same 10/05/15. Orthostatic BP Lying-120/58 and standing 98/40.

## 2015-10-13 NOTE — ED Notes (Signed)
Bed: WA01 Expected date:  Expected time:  Means of arrival:  Comments: 55F/orthostatic

## 2015-10-14 ENCOUNTER — Telehealth: Payer: Self-pay | Admitting: Cardiology

## 2015-10-14 ENCOUNTER — Observation Stay (HOSPITAL_BASED_OUTPATIENT_CLINIC_OR_DEPARTMENT_OTHER): Payer: Medicare Other

## 2015-10-14 DIAGNOSIS — R7989 Other specified abnormal findings of blood chemistry: Secondary | ICD-10-CM

## 2015-10-14 DIAGNOSIS — R531 Weakness: Secondary | ICD-10-CM

## 2015-10-14 DIAGNOSIS — N390 Urinary tract infection, site not specified: Secondary | ICD-10-CM

## 2015-10-14 DIAGNOSIS — I779 Disorder of arteries and arterioles, unspecified: Secondary | ICD-10-CM | POA: Diagnosis not present

## 2015-10-14 DIAGNOSIS — I509 Heart failure, unspecified: Secondary | ICD-10-CM

## 2015-10-14 DIAGNOSIS — E876 Hypokalemia: Secondary | ICD-10-CM | POA: Diagnosis not present

## 2015-10-14 LAB — BASIC METABOLIC PANEL
ANION GAP: 7 (ref 5–15)
BUN: 8 mg/dL (ref 6–20)
CALCIUM: 9 mg/dL (ref 8.9–10.3)
CO2: 24 mmol/L (ref 22–32)
Chloride: 101 mmol/L (ref 101–111)
Creatinine, Ser: 0.68 mg/dL (ref 0.44–1.00)
Glucose, Bld: 170 mg/dL — ABNORMAL HIGH (ref 65–99)
POTASSIUM: 4.9 mmol/L (ref 3.5–5.1)
SODIUM: 132 mmol/L — AB (ref 135–145)

## 2015-10-14 LAB — TROPONIN I: TROPONIN I: 0.15 ng/mL — AB (ref <0.03)

## 2015-10-14 LAB — ECHOCARDIOGRAM COMPLETE
HEIGHTINCHES: 62 in
WEIGHTICAEL: 2190.49 [oz_av]

## 2015-10-14 LAB — MAGNESIUM: MAGNESIUM: 1.5 mg/dL — AB (ref 1.7–2.4)

## 2015-10-14 MED ORDER — MAGNESIUM SULFATE 2 GM/50ML IV SOLN
2.0000 g | Freq: Once | INTRAVENOUS | Status: AC
  Administered 2015-10-14: 2 g via INTRAVENOUS
  Filled 2015-10-14: qty 50

## 2015-10-14 NOTE — Progress Notes (Signed)
Echocardiogram 2D Echocardiogram has been performed.  Stacey Walker 10/14/2015, 11:50 AM

## 2015-10-14 NOTE — Clinical Social Work Placement (Signed)
Patient has a bed at [facility]. CSW has completed FL2 & will continue to follow and assist with discharge when ready.    Raynaldo Opitz, [facility] Clinical Social Worker cell #: [phone]     CLINICAL SOCIAL WORK PLACEMENT  NOTE  Date:  10/14/2015  Patient Details  Name: Stacey Walker MRN: [id number] Date of Birth: 09-11-1925  Clinical Social Work is seeking post-discharge placement for this patient at the [place] level of care (*CSW will initial, date and re-position this form in  chart as items are completed):  Yes   Patient/family provided with [facility] Work Department's list of facilities offering this level of care within the geographic area requested by the patient (or if unable, by the patient's family).  Yes   Patient/family informed of their freedom to choose among providers that offer the needed level of care, that participate in Medicare, Medicaid or managed care program needed by the patient, have an available bed and are willing to accept the patient.  Yes   Patient/family informed of [facility]'s ownership interest in [facility] and [facility], as well as of the fact that they are under no obligation to receive care at these facilities.  PASRR submitted to EDS on       PASRR number received on       Existing PASRR number confirmed on 10/14/15     FL2 transmitted to all facilities in geographic area requested by pt/family on 10/14/15     FL2 transmitted to all facilities within larger geographic area on       Patient informed that his/her managed care company has contracts with or will negotiate with certain facilities, including the following:        Yes   Patient/family informed of bed offers received.  Patient [facility] bed at [facility]     Physician recommends and patient chooses bed at      Patient to be transferred to [facility] on  .  Patient to be transferred to  facility by       Patient family notified on   of transfer.  Name of family member notified:        PHYSICIAN       Additional Comment:    _______________________________________________ Standley Brooking, LCSW 10/14/2015, 2:34 PM

## 2015-10-14 NOTE — NC FL2 (Signed)
[place] LEVEL OF CARE SCREENING TOOL     IDENTIFICATION  Patient Name: Stacey Walker Birthdate: [birth year] Sex: female Admission Date (Current Location): 10/13/2015  [facility] and [place] Number:  Herbalist and Address:  [facility],  [place] [address], [place]      Provider Number: [id number]  Attending Physician Name and Address:  Thurnell Lose, MD  Relative Name and Phone Number:       Current Level of Care: Hospital Recommended Level of Care: [place] Prior Approval Number:    Date Approved/Denied:   PASRR Number: [id number]  Discharge Plan: Home    Current Diagnoses: Patient Active Problem List   Diagnosis Date Noted  . Acute hypokalemia 10/13/2015  . UTI (lower urinary tract infection) 10/13/2015  . Generalized weakness 10/13/2015  . Urge incontinence 09/08/2015  . Tremor 09/08/2015  . Gait disorder 07/18/2014  . CAP (community acquired pneumonia) 06/30/2014  . Lung nodule 06/30/2014  . Elevated troponin 06/30/2014  . Pneumonia 06/30/2014  . Hypertension, essential, benign   . Dyslipidemia   . Bilateral carotid artery disease (HCC)     Orientation RESPIRATION BLADDER Height & Weight     Self, Time, Situation, Place  Normal Continent Weight: 136 lb 14.5 oz (62.1 kg) Height:  5\' 2"  (157.5 cm)  BEHAVIORAL SYMPTOMS/MOOD NEUROLOGICAL BOWEL NUTRITION STATUS      Continent Diet (Heart )  AMBULATORY STATUS COMMUNICATION OF NEEDS Skin   Extensive Assist Verbally Normal                       Personal Care Assistance Level of Assistance  Bathing, Dressing Bathing Assistance: Limited assistance   Dressing Assistance: Limited assistance     Functional Limitations Info             SPECIAL CARE FACTORS FREQUENCY  PT (By licensed PT), OT (By licensed OT)     PT Frequency: 5 OT Frequency: 5            Contractures      Additional Factors Info  Code Status, Allergies Code  Status Info: DNR Allergies Info: NKDA           Current Medications (10/14/2015):  This is the current hospital active medication list Current Facility-Administered Medications  Medication Dose Route Frequency Provider Last Rate Last Dose  . 0.9 %  sodium chloride infusion   Intravenous Continuous Thurnell Lose, MD 75 mL/hr at 10/14/15 [phone]    . acetaminophen (TYLENOL) tablet 650 mg  650 mg Oral Q6H PRN Thurnell Lose, MD   650 mg at 10/14/15 0116  . aspirin chewable tablet 81 mg  81 mg Oral Daily Thurnell Lose, MD   81 mg at 10/14/15 0915  . bisacodyl (DULCOLAX) suppository 10 mg  10 mg Rectal Daily Thurnell Lose, MD   10 mg at 10/14/15 0916  . carbidopa-levodopa (SINEMET IR) 25-100 MG per tablet immediate release 0.5 tablet  0.5 tablet Oral TID Thurnell Lose, MD   0.5 tablet at 10/14/15 0914  . cefTRIAXone (ROCEPHIN) 1 g in dextrose 5 % 50 mL IVPB  1 g Intravenous Q24H Thurnell Lose, MD      . docusate sodium (COLACE) capsule 200 mg  200 mg Oral BID Thurnell Lose, MD   200 mg at 10/14/15 0915  . ezetimibe-simvastatin (VYTORIN) 10-40 MG per tablet 1 tablet  1 tablet Oral Daily Thurnell Lose, MD  1 tablet at 10/14/15 0914  . heparin injection 5,000 Units  5,000 Units Subcutaneous Q8H Thurnell Lose, MD      . HYDROcodone-acetaminophen (NORCO/VICODIN) 5-325 MG per tablet 1 tablet  1 tablet Oral Q6H PRN Thurnell Lose, MD      . latanoprost (XALATAN) 0.005 % ophthalmic solution 1 drop  1 drop Both Eyes QHS Thurnell Lose, MD   1 drop at 10/13/15 2047  . magnesium hydroxide (MILK OF MAGNESIA) suspension 30 mL  30 mL Oral Daily PRN Thurnell Lose, MD      . metoprolol tartrate (LOPRESSOR) tablet 25 mg  25 mg Oral BID Thurnell Lose, MD   25 mg at 10/14/15 0914  . omega-3 acid ethyl esters (LOVAZA) capsule 1 g  1 g Oral Daily Thurnell Lose, MD   1 g at 10/14/15 0914  . ondansetron (ZOFRAN) tablet 4 mg  4 mg Oral Q6H PRN Thurnell Lose, MD       Or  .  ondansetron (ZOFRAN) injection 4 mg  4 mg Intravenous Q6H PRN Thurnell Lose, MD   4 mg at 10/14/15 0147  . sodium chloride flush (NS) 0.9 % injection 3 mL  3 mL Intravenous Q12H Thurnell Lose, MD   3 mL at 10/14/15 0916  . timolol (TIMOPTIC) 0.5 % ophthalmic solution 1 drop  1 drop Both Eyes Daily Thurnell Lose, MD   1 drop at 10/14/15 [id number]     Discharge Medications: Please see discharge summary for a list of discharge medications.  Relevant Imaging Results:  Relevant Lab Results:   Additional Information SSN: [id number]  Standley Brooking, LCSW

## 2015-10-14 NOTE — Care Management Note (Signed)
Case Management Note  Patient Details  Name: Stacey Walker MRN: [id number] Date of Birth: Oct 05, 1925  Subjective/Objective: 82 y/o f admitted Bilateral CAD. From [employer]. PT-recc SNF. MD ordered HHC-provided patient/dtr w/HHC agency list if plans change to going home.CSW notified about SNF,faxed out..Patient/dtr w/many concerns about hospital stay,& services-Nsg aware.If d/c plan is home will need ambulance transp.                   Action/Plan:d/c SNF.   Expected Discharge Date:   (unknown)               Expected Discharge Plan:  Skilled Nursing Facility  In-House Referral:  Clinical Social Work  Discharge planning Services  CM Consult  Post Acute Care Choice:  Resumption of Svcs/PTA Provider, Durable Medical Equipment (Comfort keepers-nurse's aide;has rw,hover around.) Choice offered to:     DME Arranged:    DME Agency:     HH Arranged:    [place] Agency:     Status of Service:  In process, will continue to follow  If discussed at Long Length of Stay Meetings, dates discussed:    Additional Comments:  Dessa Phi, RN 10/14/2015, 2:02 PM

## 2015-10-14 NOTE — Clinical Social Work Note (Signed)
Clinical Social Work Assessment  Patient Details  Name: Stacey Walker MRN: [id number] Date of Birth: [birth year]  Date of referral:  10/14/15               Reason for consult:  Facility Placement                Permission sought to share information with:  Facility Art therapist granted to share information::  Yes, Verbal Permission Granted  Name::        Agency::     Relationship::     Contact Information:     Housing/Transportation Living arrangements for the past 2 months:  [place] of Information:  Patient Patient Interpreter Needed:  None Criminal Activity/Legal Involvement Pertinent to Current Situation/Hospitalization:  No - Comment as needed Significant Relationships:  None Lives with:  Self Do you feel safe going back to the place where you live?  No Need for family participation in patient care:  Yes (Comment)  Care giving concerns:  CSW received consult for SNF placement.    Social Worker assessment / plan:  CSW met with patient & daughter, Stacey Walker at bedside re: discharge planning. Patient informed CSW that she lives at [place] had been to [facility] in the past. CSW checked with Claiborne Billings at [facility] to confirm that they would be able to offer a bed.   Employment status:  Retired Forensic scientist:  Self Pay (Medicaid Pending), Managed Medicare PT Recommendations:  [place] / Referral to community resources:  [place]  Patient/Family's Response to care:  Patient was pleased to hear that [facility] would be able to take patient when ready to discharge.   Patient/Family's Understanding of and Emotional Response to Diagnosis, Current Treatment, and Prognosis:  Patient states that she is still unclear of diagnosis and expressed frustration of having to come back to hospital for same symptoms.   Emotional Assessment Appearance:  Appears stated  age Attitude/Demeanor/Rapport:    Affect (typically observed):    Orientation:  Oriented to Place, Oriented to Self, Oriented to  Time, Oriented to Situation Alcohol / Substance use:    Psych involvement (Current and /or in the community):     Discharge Needs  Concerns to be addressed:    Readmission within the last 30 days:    Current discharge risk:    Barriers to Discharge:      Stacey Brooking, LCSW 10/14/2015, 2:31 PM

## 2015-10-14 NOTE — Progress Notes (Addendum)
PROGRESS NOTE                                                                                                                                                                                                             Patient Demographics:    Stacey Walker, is a 82 y.o. female, DOB - 02/19/1926, [id number]  Admit date - 10/13/2015   Admitting Physician Thurnell Lose, MD  Outpatient Primary MD for the patient is Donnajean Lopes, MD  LOS - 0  Chief Complaint  Patient presents with  . Weakness       Brief Narrative   Stacey Walker  is a 82 y.o. female, With history of essential hypertension, bladder and cervical cancer, dyslipidemia, glaucoma, mention of CAD in the chart but patient denies any history of the same who still lives at home uses a walker to ambulate comes in with 5-7 day history of generalized weakness, some dysuria, she came to the ER about 1 week ago and was discharged with oral medications without much benefit. She comes in with persisting symptoms. This time in the ER UA again consistent with UTI, she was also found to be dehydrated with hypokalemia, mild nonspecific troponin elevation without any chest pain or shortness of breath and I was called to admit. Besides above symptoms patient also condones to constipation last bowel movement 3 days ago, she has no other subjective complaints. No chest pain.     Subjective:    Stacey Walker today has, No headache, No chest pain, No abdominal pain - No Nausea, No new weakness tingling or numbness, No Cough - SOB.    Assessment  & Plan :     1. Generalized weakness caused by a combination of UTI and dehydration. Will be admitted, urine cultures, IV Rocephin, IV fluids, overall better, PT eval done ? SNF, have called S work.  2. Hypokalemia & hypomagnesemia . Due to HCTZ, hold medication, replaced.  3. Nonspecific troponin elevation. No chest pain or  shortness of breath, EKG nonacute, does have history of CAD in the chart but patient denies knowing any history of CAD. Continue aspirin, Trental troponin, check echocardiogram to evaluate wall motion and EF. Add low-dose beta blocker and continue home dose statin.  4. Hypertension. Hold ARB and HCTZ combination due to dehydration and hypokalemia, low-dose beta blocker and monitor.  5. Glaucoma. Continue eyedrops.  6. Dyslipidemia. Continue home dose statin.  7. Constipation. Placed on bowel regimen.  8.Incidental RUL opacity - PCP to follow was present on a previous CT as well.    Family Communication  :  None  Code Status :  Full  Diet : Heart Healthy  Disposition Plan  :  ? SNF  Consults  :  none  Procedures  :   TTE  DVT Prophylaxis  :    Heparin    Lab Results  Component Value Date   PLT 393 10/13/2015    Inpatient Medications  Scheduled Meds: . aspirin  81 mg Oral Daily  . bisacodyl  10 mg Rectal Daily  . carbidopa-levodopa  0.5 tablet Oral TID  . cefTRIAXone (ROCEPHIN)  IV  1 g Intravenous Q24H  . docusate sodium  200 mg Oral BID  . ezetimibe-simvastatin  1 tablet Oral Daily  . heparin  5,000 Units Subcutaneous Q8H  . latanoprost  1 drop Both Eyes QHS  . metoprolol tartrate  25 mg Oral BID  . omega-3 acid ethyl esters  1 g Oral Daily  . sodium chloride flush  3 mL Intravenous Q12H  . timolol  1 drop Both Eyes Daily   Continuous Infusions: . sodium chloride 75 mL/hr at 10/14/15 0621   PRN Meds:.acetaminophen **OR** [DISCONTINUED] acetaminophen, HYDROcodone-acetaminophen, magnesium hydroxide, ondansetron **OR** ondansetron (ZOFRAN) IV  Antibiotics  :    Anti-infectives    Start     Dose/Rate Route Frequency Ordered Stop   10/14/15 1500  cefTRIAXone (ROCEPHIN) 1 g in dextrose 5 % 50 mL IVPB     1 g 100 mL/hr over 30 Minutes Intravenous Every 24 hours 10/13/15 1622 10/17/15 1459   10/13/15 1515  cefTRIAXone (ROCEPHIN) 1 g in dextrose 5 % 50 mL  IVPB     1 g 100 mL/hr over 30 Minutes Intravenous  Once 10/13/15 1505 10/13/15 1610         Objective:   Vitals:   10/13/15 1629 10/13/15 2028 10/14/15 0447 10/14/15 0448  BP: (!) 138/55 (!) 129/42 (!) 117/44   Pulse: 84 62 87   Resp: 18 16 18    Temp: 98.9 F (37.2 C) 98.4 F (36.9 C) 99 F (37.2 C)   TempSrc: Oral Oral Oral   SpO2: 96% 96% 96%   Weight: 61.1 kg (134 lb 11.2 oz)   62.1 kg (136 lb 14.5 oz)  Height: 5\' 2"  (1.575 m)       Wt Readings from Last 3 Encounters:  10/14/15 62.1 kg (136 lb 14.5 oz)  10/05/15 60.3 kg (133 lb)  09/08/15 63 kg (139 lb)     Intake/Output Summary (Last 24 hours) at 10/14/15 1247 Last data filed at 10/14/15 0600  Gross per 24 hour  Intake             1000 ml  Output              400 ml  Net              600 ml     Physical Exam  Awake Alert, Oriented X 3, No new F.N deficits, Normal affect [place].AT,PERRAL Supple Neck,No JVD, No cervical lymphadenopathy appriciated.  Symmetrical Chest wall movement, Good air movement bilaterally, CTAB RRR,No Gallops,Rubs or new Murmurs, No Parasternal Heave +ve B.Sounds, Abd Soft, No tenderness, No organomegaly appriciated, No rebound - guarding or rigidity. No Cyanosis, Clubbing or edema, No new Rash or bruise  Data Review:    CBC  Recent Labs Lab 10/13/15 1222  WBC 9.4  HGB 12.8  HCT 38.5  PLT 393  MCV 86.3  MCH 28.7  MCHC 33.2  RDW 14.3    Chemistries   Recent Labs Lab 10/13/15 1222 10/14/15 0759  NA 132* 132*  K 2.9* 4.9  CL 96* 101  CO2 27 24  GLUCOSE 105* 170*  BUN 9 8  CREATININE 0.54 0.68  CALCIUM 9.3 9.0  MG  --  1.5*   ------------------------------------------------------------------------------------------------------------------ No results for input(s): CHOL, HDL, LDLCALC, TRIG, CHOLHDL, LDLDIRECT in the last 72 hours.  No results found for:  HGBA1C ------------------------------------------------------------------------------------------------------------------  Recent Labs  10/13/15 1830  TSH 2.574   ------------------------------------------------------------------------------------------------------------------ No results for input(s): VITAMINB12, FOLATE, FERRITIN, TIBC, IRON, RETICCTPCT in the last 72 hours.  Coagulation profile  Recent Labs Lab 10/13/15 1830  INR 1.07    No results for input(s): DDIMER in the last 72 hours.  Cardiac Enzymes  Recent Labs Lab 10/13/15 1222 10/13/15 1830 10/14/15 0048  TROPONINI 0.16* 0.17* 0.15*   ------------------------------------------------------------------------------------------------------------------    Component Value Date/Time   BNP 133.7 (H) 06/30/2014 1030    Micro Results Recent Results (from the past 240 hour(s))  MRSA PCR Screening     Status: None   Collection Time: 10/13/15  5:26 PM  Result Value Ref Range Status   MRSA by PCR NEGATIVE NEGATIVE Final    Comment:        The GeneXpert MRSA Assay (FDA approved for NASAL specimens only), is one component of a comprehensive MRSA colonization surveillance program. It is not intended to diagnose MRSA infection nor to guide or monitor treatment for MRSA infections.     Radiology Reports Dg Chest 2 View  Result Date: 10/05/2015 CLINICAL DATA:  [place] 1 hour ago. Generalized weakness and dizziness. EXAM: CHEST  2 VIEW COMPARISON:  Chest x-ray 06/30/2014 FINDINGS: The heart is within normal limits in size given the AP projection. There is tortuosity and calcification of the thoracic aorta. There are chronic emphysematous and bronchitic lung changes but no definite acute overlying pulmonary process. No pleural effusion or focal infiltrate. The bony thorax is intact. IMPRESSION: Chronic emphysematous and bronchitic lung changes but no definite acute overlying pulmonary process. Electronically Signed   By:  Marijo Sanes M.D.   On: 10/05/2015 18:35  Ct Head Wo Contrast  Result Date: 09/17/2015  [facility] [address] [place], [place] [address] [phone] NEUROIMAGING REPORT STUDY DATE: 09/16/2015 PATIENT NAME: SHAYNEE GINGERY DOB: [birth year] MRN: [id number] EXAM: CT of the head without contrast ORDERING CLINICIAN: Richard A. Felecia Shelling, MD. PhD CLINICAL HISTORY: 82 year old woman with gait and urinary disturbances. COMPARISON FILMS: CT scan 04/01/2013 TECHNIQUE: CT scan of the head was obtained utilizing 5 mm axial slices from the skull base to the vertex. CONTRAST: None IMAGING SITE: [facility],  [address] FINDINGS: The fourth ventricle is normal in size and appearance. The third and lateral ventricles are mildly enlarged, in proportion to the extent of mild to moderate cortical atrophy, relatively unchanged when compared to the 04/01/2013 CT scan.. No extra-axial fluid collections are seen.  No intracranial hemorrhage.  No evidence of mass effect or midline shift.  The cerebellum, brainstem and deep gray matter appears normal. There are hypodense changes in the periventricular, deep and subcortical white matter of both hemispheres consistent with chronic microvascular ischemic change. This is essentially unchanged when compared to the prior CT scan. There some fluid  noted within the sphenoid sinus, improved when compared to 04/01/2013 CT scan. The orbits and their contents and calvarium are unremarkable.     This CT scan of the head without contrast shows the following: 1.    Mild to moderate cortical atrophy, unchanged when compared to the 04/01/2013 CT scan. 2.    Chronic microvascular ischemic changes in the hemispheres, essentially unchanged when compared to the prior CT scan.   There are no acute brain findings. 3.    A lot of fluid in the sphenoid sinus, less than what was noted in 2015.  INTERPRETING PHYSICIAN: Richard A. Felecia Shelling, MD, PhD Certified in  Neuroimaging by  [place] [employer] Neuroimaging   Dg Chest Port 1 View  Result Date: 10/13/2015 CLINICAL DATA:  Shortness of breath.  Weakness. EXAM: PORTABLE CHEST 1 VIEW COMPARISON:  Radiographs of October 05, 2015. CT scan of June 30, 2014. FINDINGS: Stable cardiomediastinal silhouette. Atherosclerosis of thoracic aorta is noted. No pneumothorax or pleural effusion is noted. Nodular right upper lobe opacity is noted. Left lung is clear. Multilevel osteophyte formation is noted in thoracic spine. IMPRESSION: Aortic atherosclerosis. Nodular right upper lobe opacity is noted which may correspond to abnormality seen on prior CT scan. Follow-up chest CT scan without contrast administration is recommended to ensure stability and rule out neoplasm. Electronically Signed   By: Marijo Conception, M.D.   On: 10/13/2015 15:29    Time Spent in minutes  30   Annemarie Sebree K M.D on 10/14/2015 at 12:47 PM  Between 7am to 7pm - Pager - [phone]  After 7pm go to [email or web address] - password [facility]  [facility] -  Office  [phone]

## 2015-10-14 NOTE — Care Management Obs Status (Signed)
[place] NOTIFICATION   Patient Details  Name: Stacey Walker MRN: [id number] Date of Birth: Feb 01, 1926   Medicare Observation Status Notification Given:  Yes    MahabirJuliann Pulse, RN 10/14/2015, 2:00 PM

## 2015-10-14 NOTE — Evaluation (Signed)
Physical Therapy Evaluation Patient Details Name: Stacey Walker MRN: [id number] DOB: [birth year] Today's Date: 10/14/2015   History of Present Illness  82 y.o. female with history of essential hypertension, bladder and cervical cancer, dyslipidemia, glaucoma, Parkinsons and admitted with generalized weakness, UTI, dehydration  Clinical Impression  Pt admitted with above diagnosis. Pt currently with functional limitations due to the deficits listed below (see PT Problem List).  Pt will benefit from skilled PT to increase their independence and safety with mobility to allow discharge to the venue listed below.  Pt states her daughter assists with IADLs.  Pt also states she goes to dining hall in a power chair.   Recommending SNF at this time due to pt mobilizing slowly, fatiguing quickly, and also uncertain if pt will have appropriate assist upon d/c.     Follow Up Recommendations SNF;Supervision for mobility/OOB    Equipment Recommendations  None recommended by PT    Recommendations for Other Services       Precautions / Restrictions Precautions Precautions: Fall Restrictions Weight Bearing Restrictions: No      Mobility  Bed Mobility               General bed mobility comments: pt up in recliner on arrival  Transfers Overall transfer level: Needs assistance Equipment used: Rolling walker (2 wheeled) Transfers: Sit to/from Stand Sit to Stand: Min assist         General transfer comment: increased time however no physical assist required to rise, assist for controlling descent  Ambulation/Gait Ambulation/Gait assistance: Min guard Ambulation Distance (Feet): 40 Feet Assistive device: Rolling walker (2 wheeled) Gait Pattern/deviations: Step-through pattern;Shuffle;Narrow base of support;Decreased stride length Gait velocity: decr   General Gait Details: pt reports always using RW, states she has difficulty with turning, fatigues quickly  Stairs             Wheelchair Mobility    Modified Rankin (Stroke Patients Only)       Balance Overall balance assessment: Needs assistance         Standing balance support: Bilateral upper extremity supported;During functional activity Standing balance-Leahy Scale: Poor Standing balance comment: requires UE support                             Pertinent Vitals/Pain Pain Assessment: No/denies pain    Home Living       Type of Home: Independent living facility Home Access: Elevator     Home Layout: One level Home Equipment: Walker - 4 wheels;Walker - 2 wheels;Electric scooter      Prior Function Level of Independence: Independent with assistive device(s)         Comments: pt uses RW around home, states she takes power chair to dining hall, pt reports her daughter assists with IADLs     Hand Dominance        Extremity/Trunk Assessment               Lower Extremity Assessment: Generalized weakness         Communication   Communication: No difficulties  Cognition Arousal/Alertness: Awake/alert Behavior During Therapy: WFL for tasks assessed/performed Overall Cognitive Status: Within Functional Limits for tasks assessed                      General Comments      Exercises        Assessment/Plan    PT Assessment Patient needs continued PT  services  PT Diagnosis Difficulty walking;Generalized weakness   PT Problem List Decreased strength;Decreased activity tolerance;Decreased balance;Decreased mobility;Decreased coordination  PT Treatment Interventions Gait training;DME instruction;Therapeutic exercise;Balance training;Functional mobility training;Therapeutic activities;Patient/family education   PT Goals (Current goals can be found in the Care Plan section) Acute Rehab PT Goals PT Goal Formulation: With patient Time For Goal Achievement: 10/28/15 Potential to Achieve Goals: Good    Frequency Min 3X/week   Barriers to discharge         Co-evaluation               End of Session Equipment Utilized During Treatment: Gait belt Activity Tolerance: Patient limited by fatigue Patient left: in chair;with call bell/phone within reach;with chair alarm set Nurse Communication: Mobility status    Functional Assessment Tool Used: clinical judgement Functional Limitation: Mobility: Walking and moving around Mobility: Walking and Moving Around Current Status [id number]): At least 20 percent but less than 40 percent impaired, limited or restricted Mobility: Walking and Moving Around Goal Status [phone]): At least 1 percent but less than 20 percent impaired, limited or restricted    Time: [id number] PT Time Calculation (min) (ACUTE ONLY): 23 min   Charges:   PT Evaluation $PT Eval Moderate Complexity: 1 Procedure     PT G Codes:   PT G-Codes **NOT FOR INPATIENT CLASS** Functional Assessment Tool Used: clinical judgement Functional Limitation: Mobility: Walking and moving around Mobility: Walking and Moving Around Current Status [id number]): At least 20 percent but less than 40 percent impaired, limited or restricted Mobility: Walking and Moving Around Goal Status [phone]): At least 1 percent but less than 20 percent impaired, limited or restricted    Stacey Walker,KATHrine E 10/14/2015, 12:33 PM Carmelia Bake, PT, DPT 10/14/2015 Pager: [phone]

## 2015-10-14 NOTE — Consult Note (Signed)
Cardiology Consult    Patient ID: SAGA HALPAIN MRN: [id number], DOB/AGE: [birth year]   Admit date: 10/13/2015 Date of Consult: 10/14/2015  Primary Physician: Donnajean Lopes, MD Reason for Consult: Elevated Troponin Primary Cardiologist: Dr. Ellyn Hack Requesting Provider: Dr. Candiss Norse   History of Present Illness    Stacey Walker is a 82 y.o. female with past medical history of CAD (listed in chart but no known history of this), HTN, HLD, carotid artery disease, and bladder and cervical cancer who presented to [facility] ED on 10/13/2015 for evaluation of weakness.  Patient was initially seen in the ED on 7/30 for weakness and was diagnosed with dehydration. She was given IVF and discharged from the ED. Since then, she reports feeling tired and weak, saying this has progressed in severity. Notes some dysuria, denies hematuria. She denies any recent chest discomfort, palpitations, or shortness of breath. [facility] but is planning to go to SNF for acute rehab at time of discharge to regain her strength.   Initial labs showed a WBC of 9.4, Hgb 12.8, platelets 393. K+ 2.9. Creatinine 0.54. UA with small leukocytes and trace Hgb. Urine culture positive for gram negative rods and she is currently being treated for a UTI. TSH WNL. Cyclic troponin values have been checked and are 0.16, 0.17, and 0.15 (Of note, when she was hospitalized in 06/2014 for CAP, troponin values were 0.18, 0.18, and 0.20 at that time). CXR shows aortic atherosclerosis, Nodular right upper lobe opacity is noted which may correspond to abnormality seen on prior CT scan with follow-up chest CT scan without contrast administration is recommended to rule out neoplasm. EKG shows NSR, HR 65, with no acute ST or T-wave changes. Has been connected to telemetry with no arrhythmias or pauses noted. Patient's daughter who is an Therapist, sports says she was having PVC's initially upon admission. This appears to have resolved  with treatment of her abnormal electrolytes and starting a BB.  An echocardiogram has been obtained which showed an EF of 55-60% with no regional wall motion abnormalities. Grade 2 DD noted. No AS or AR noted.  Last seen by Dr. Ellyn Hack in 03/2014 and doing well at that time. Is followed for her carotid artery disease along with HTN and HDL control. Doppler studies in 2016 showed 50-69% R ICA stenosis and 70% L ICA stenosis, with no change when compared to previous imaging.  Past Medical History   Past Medical History:  Diagnosis Date  . Asymptomatic carotid artery stenosis    Dopplers January 2016: RICA 50-69% tortuous (no change; L ICA ~70%, tortuous (no change), bilateral subclavian arteries <50%  . Bladder cancer ([place])   . Cervical cancer ([place])   . Coronary artery disease   . Dyslipidemia    Managed by PCP. On statin  . Glaucoma   . Hypertension   . Hypertension, essential, benign   . Moderate Right-sided carotid artery disease    50-70% stenosis on the right carotid artery from 2011. Also noted less than 50% right and left subclavian artery stenosis.  . Parkinson's disease [facility])     Past Surgical History:  Procedure Laterality Date  . ABDOMINAL HYSTERECTOMY    . BLADDER TUMOR EXCISION    . Carotid Dopplers  July 2011   50-70% stenosis of the right carotid with <50% stenosis of both the right and left subclavian arteries.  . CHOLECYSTECTOMY    . NM MYOVIEW LTD  August 2007   No  ischemia or infarction  . TRANSTHORACIC ECHOCARDIOGRAM  August 7   Normal EF, impaired relaxation with mildly sclerotic aortic valve but no stenosis. Mild left atrial dilation.     Allergies  No Known Allergies  Inpatient Medications    . aspirin  81 mg Oral Daily  . bisacodyl  10 mg Rectal Daily  . carbidopa-levodopa  0.5 tablet Oral TID  . cefTRIAXone (ROCEPHIN)  IV  1 g Intravenous Q24H  . docusate sodium  200 mg Oral BID  . ezetimibe-simvastatin  1 tablet Oral Daily  . heparin  5,000  Units Subcutaneous Q8H  . latanoprost  1 drop Both Eyes QHS  . metoprolol tartrate  25 mg Oral BID  . omega-3 acid ethyl esters  1 g Oral Daily  . sodium chloride flush  3 mL Intravenous Q12H  . timolol  1 drop Both Eyes Daily    Family History    Family History  Problem Relation Age of Onset  . Dementia Mother   . Lung cancer Father     Social History    Social History   Social History  . Marital status: Widowed    Spouse name: N/A  . Number of children: N/A  . Years of education: N/A   Occupational History  . Not on file.   Social History Main Topics  . Smoking status: Former Smoker    Quit date: 03/08/1978  . Smokeless tobacco: Never Used  . Alcohol use Yes     Comment: occas  . Drug use: No  . Sexual activity: Not on file   Other Topics Concern  . Not on file   Social History Narrative   Widowed mother of one, grandmother of one. She quit smoking in 1980. She has a rare alcohol beverage from time to time. She currently lives at [facility] on [address].   She is able to get around there without to much difficulty.   During previous visit, she voiced the desire for DO NOT RESUSCITATE and DO NOT INTUBATE. She also is with the desire to avoid any invasive procedures.      Review of Systems    General:  No chills, fever, night sweats or weight changes. Positive for generalized weakness. Cardiovascular:  No chest pain, dyspnea on exertion, edema, orthopnea, palpitations, paroxysmal nocturnal dyspnea. Dermatological: No rash, lesions/masses Respiratory: No cough, dyspnea Urologic: No hematuria, Positive for dysuria. Abdominal:   No nausea, vomiting, diarrhea, bright red blood per rectum, melena, or hematemesis Neurologic:  No visual changes, changes in mental status. All other systems reviewed and are otherwise negative except as noted above.  Physical Exam    Blood pressure (!) 116/48, pulse 63, temperature 98.3 F (36.8 C), temperature  source Oral, resp. rate 18, height 5\' 2"  (1.575 m), weight 136 lb 14.5 oz (62.1 kg), SpO2 99 %.  General: Pleasant, elderly Caucasian female appearing in NAD. Psych: Normal affect. Neuro: Alert and oriented X 3. Moves all extremities spontaneously. HEENT: Normal  Neck: Supple without bruits or JVD. Lungs:  Resp regular and unlabored, CTA without wheezing or rales. Heart: RRR no s3, s4, or murmurs. Abdomen: Soft, non-tender, non-distended, BS + x 4.  Extremities: No clubbing, cyanosis or edema. DP/PT/Radials 2+ and equal bilaterally.  Labs    Troponin (Point of Care Test) No results for input(s): TROPIPOC in the last 72 hours.  Recent Labs  10/13/15 1222 10/13/15 1830 10/14/15 0048  TROPONINI 0.16* 0.17* 0.15*   Lab Results  Component Value  Date   WBC 9.4 10/13/2015   HGB 12.8 10/13/2015   HCT 38.5 10/13/2015   MCV 86.3 10/13/2015   PLT 393 10/13/2015    Recent Labs Lab 10/14/15 0759  NA 132*  K 4.9  CL 101  CO2 24  BUN 8  CREATININE 0.68  CALCIUM 9.0  GLUCOSE 170*   No results found for: CHOL, HDL, LDLCALC, TRIG No results found for: [facility]   Radiology Studies    Dg Chest 2 View  Result Date: 10/05/2015 CLINICAL DATA:  [place] 1 hour ago. Generalized weakness and dizziness. EXAM: CHEST  2 VIEW COMPARISON:  Chest x-ray 06/30/2014 FINDINGS: The heart is within normal limits in size given the AP projection. There is tortuosity and calcification of the thoracic aorta. There are chronic emphysematous and bronchitic lung changes but no definite acute overlying pulmonary process. No pleural effusion or focal infiltrate. The bony thorax is intact. IMPRESSION: Chronic emphysematous and bronchitic lung changes but no definite acute overlying pulmonary process. Electronically Signed   By: Marijo Sanes M.D.   On: 10/05/2015 18:35  Ct Head Wo Contrast  Result Date: 09/17/2015  [facility] [address] [place], [place] [address] [phone]  NEUROIMAGING REPORT STUDY DATE: 09/16/2015 PATIENT NAME: ALOURA STEPPER DOB: 29-May-1925 MRN: [id number] EXAM: CT of the head without contrast ORDERING CLINICIAN: Richard A. Felecia Shelling, MD. PhD CLINICAL HISTORY: 82 year old woman with gait and urinary disturbances. COMPARISON FILMS: CT scan 04/01/2013 TECHNIQUE: CT scan of the head was obtained utilizing 5 mm axial slices from the skull base to the vertex. CONTRAST: None IMAGING SITE: [facility],  [address] FINDINGS: The fourth ventricle is normal in size and appearance. The third and lateral ventricles are mildly enlarged, in proportion to the extent of mild to moderate cortical atrophy, relatively unchanged when compared to the 04/01/2013 CT scan.. No extra-axial fluid collections are seen.  No intracranial hemorrhage.  No evidence of mass effect or midline shift.  The cerebellum, brainstem and deep gray matter appears normal. There are hypodense changes in the periventricular, deep and subcortical white matter of both hemispheres consistent with chronic microvascular ischemic change. This is essentially unchanged when compared to the prior CT scan. There some fluid noted within the sphenoid sinus, improved when compared to 04/01/2013 CT scan. The orbits and their contents and calvarium are unremarkable.     This CT scan of the head without contrast shows the following: 1.    Mild to moderate cortical atrophy, unchanged when compared to the 04/01/2013 CT scan. 2.    Chronic microvascular ischemic changes in the hemispheres, essentially unchanged when compared to the prior CT scan.   There are no acute brain findings. 3.    A lot of fluid in the sphenoid sinus, less than what was noted in 2015.  INTERPRETING PHYSICIAN: Richard A. Felecia Shelling, MD, PhD Certified in  Neuroimaging by [employer]   Dg Chest Port 1 View  Result Date: 10/13/2015 CLINICAL DATA:  Shortness of breath.  Weakness. EXAM: PORTABLE CHEST 1 VIEW COMPARISON:  Radiographs  of October 05, 2015. CT scan of June 30, 2014. FINDINGS: Stable cardiomediastinal silhouette. Atherosclerosis of thoracic aorta is noted. No pneumothorax or pleural effusion is noted. Nodular right upper lobe opacity is noted. Left lung is clear. Multilevel osteophyte formation is noted in thoracic spine. IMPRESSION: Aortic atherosclerosis. Nodular right upper lobe opacity is noted which may correspond to abnormality seen on prior CT scan. Follow-up chest CT scan without contrast  administration is recommended to ensure stability and rule out neoplasm. Electronically Signed   By: Marijo Conception, M.D.   On: 10/13/2015 15:29    EKG & Cardiac Imaging    EKG: NSR, HR 65, with no acute ST or T-wave changes.  Echocardiogram: 10/14/2015 Study Conclusions  - Left ventricle: The cavity size was normal. There was moderate   concentric hypertrophy. Systolic function was normal. The   estimated ejection fraction was in the range of 55% to 60%. Wall   motion was normal; there were no regional wall motion   abnormalities. Doppler parameters are consistent with   pseudonormal left ventricular relaxation (grade 2 diastolic   dysfunction). The E/A ratio is >20, suggesting markedly elevated   LV filling pressure. - Mitral valve: Calcified annulus. Mildly thickened leaflets .   There was mild regurgitation. - Left atrium: The atrium was moderately dilated. - Right ventricle: The cavity size was mildly dilated. Systolic   function was low normal. - Right atrium: The atrium was mildly dilated. - Pulmonic valve: There was mild regurgitation. - Inferior vena cava: The vessel was normal in size. The   respirophasic diameter changes were in the normal range (>= 50%),   consistent with normal central venous pressure.  Impressions: - Compared to a prior study in 2016, the LVEF is stable at 55-60%.   There is Grade 2 DD with elevated LV filling pressure.  Assessment & Plan    1. Elevated Troponin - presented  with generalized weakness and dysuria, diagnosed with a UTI. She denies any recent anginal symptoms such as chest discomfort or dyspnea with exertion. CAD listed in history but patient denies any history of this and nothing listed in past office visits.  - Cyclic troponin values have been 0.16, 0.17, and 0.15. When she was hospitalized in 06/2014 for CAP, troponin values were 0.18, 0.18, and 0.20 at that time.  - echo this admission shows 55-60% with no regional wall motion abnormalities. Grade 2 DD noted. No AS or AR noted. EKG without acute ischemic changes.  - with no anginal symptoms, normal EF by echo with no wall motion abnormalities, normal EKG, and flat-troponin trend, would not pursue further ischemic evaluation at this time. - continue ASA, statin, and BB.   2. HTN - BP has been 116/42 - 138/55 in the past 24 hours.  - Diovan-HCTZ stopped in the setting of dehydration, UTI, and hypokalemia. Was started on Lopressor 25mg  BID in place of this. HR and BP are well-controlled. - may need to restart ABR if SBP is consistently elevated to > 140 in the outpatient setting.   3. HLD - continue Vytorin.   4. Hypokalemia/ Hypomagnesemia - K+ 2.9 on admission, improved to 4.9 today following supplementation. - Mg 1.5. Has been replaced.  5. UTI/ Generalized Weakness - per admitting team  6. RUL Opacity  - Nodular right upper lobe opacity noted on CXR. Follow-up CT recommended by Radiology. Needs to be followed-up on as an outpatient.   Signed, Erma Heritage, PA-C 10/14/2015, 2:40 PM Pager: [phone]  I have examined the patient and reviewed assessment and plan and discussed with patient.  Agree with above as stated.  Mildly elevated troponin.  No RWMA with echo.  No chest pain.  No further cardiac w/u needed at this time.  Larae Grooms

## 2015-10-15 ENCOUNTER — Observation Stay (HOSPITAL_COMMUNITY): Payer: Medicare Other

## 2015-10-15 DIAGNOSIS — E876 Hypokalemia: Secondary | ICD-10-CM | POA: Diagnosis not present

## 2015-10-15 DIAGNOSIS — E440 Moderate protein-calorie malnutrition: Secondary | ICD-10-CM | POA: Insufficient documentation

## 2015-10-15 DIAGNOSIS — I779 Disorder of arteries and arterioles, unspecified: Secondary | ICD-10-CM | POA: Diagnosis not present

## 2015-10-15 LAB — URINE CULTURE

## 2015-10-15 LAB — MAGNESIUM: MAGNESIUM: 1.6 mg/dL — AB (ref 1.7–2.4)

## 2015-10-15 MED ORDER — MAGNESIUM SULFATE 2 GM/50ML IV SOLN
2.0000 g | Freq: Once | INTRAVENOUS | Status: AC
  Administered 2015-10-15: 2 g via INTRAVENOUS
  Filled 2015-10-15: qty 50

## 2015-10-15 MED ORDER — BOOST / RESOURCE BREEZE PO LIQD
1.0000 | ORAL | Status: DC
  Administered 2015-10-15 – 2015-10-16 (×2): 1 via ORAL

## 2015-10-15 MED ORDER — POLYETHYLENE GLYCOL 3350 17 G PO PACK
17.0000 g | PACK | Freq: Two times a day (BID) | ORAL | Status: DC
  Administered 2015-10-15 – 2015-10-17 (×5): 17 g via ORAL
  Filled 2015-10-15 (×5): qty 1

## 2015-10-15 MED ORDER — AMOXICILLIN-POT CLAVULANATE 875-125 MG PO TABS
1.0000 | ORAL_TABLET | Freq: Two times a day (BID) | ORAL | Status: DC
  Administered 2015-10-15 – 2015-10-17 (×4): 1 via ORAL
  Filled 2015-10-15 (×4): qty 1

## 2015-10-15 MED ORDER — SODIUM CHLORIDE 0.9 % IV SOLN
INTRAVENOUS | Status: DC
  Administered 2015-10-15 (×2): via INTRAVENOUS

## 2015-10-15 NOTE — Progress Notes (Signed)
Patient has had two episodes of mild wheezing noted after eating meals, cleared after having patient sit upright, deep breathe and cough, notified MD, new orders obtained

## 2015-10-15 NOTE — Progress Notes (Addendum)
PROGRESS NOTE                                                                                                                                                                                                             Patient Demographics:    Stacey Walker, is a 82 y.o. female, DOB - 10/12/1925, [id number]  Admit date - 10/13/2015   Admitting Physician Thurnell Lose, MD  Outpatient Primary MD for the patient is Donnajean Lopes, MD  LOS - 0  Chief Complaint  Patient presents with  . Weakness       Brief Narrative   Stacey Walker  is a 81 y.o. female, With history of essential hypertension, bladder and cervical cancer, dyslipidemia, glaucoma, mention of CAD in the chart but patient denies any history of the same who still lives at home uses a walker to ambulate comes in with 5-7 day history of generalized weakness, some dysuria, she came to the ER about 1 week ago and was discharged with oral medications without much benefit. She comes in with persisting symptoms. This time in the ER UA again consistent with UTI, she was also found to be dehydrated with hypokalemia, mild nonspecific troponin elevation without any chest pain or shortness of breath and I was called to admit.   Besides above symptoms patient also condones to constipation last bowel movement 3 days ago, she has no other subjective complaints. No chest pain.     Subjective:    Stacey Walker today has, No headache, No chest pain, No abdominal pain - No Nausea, No new weakness tingling or numbness, No Cough - SOB. She still feels constipated.   Assessment  & Plan :     1. Generalized weakness caused by a combination of UTI and dehydration. Clinically better but had a low-grade fever night of 10/14/2015, she says she has intermittent chills at home for the last several days, will check a renal ultrasound to rule out pyelonephritis, continue IV Rocephin and  gentle hydration for now, PT eval done ? SNF, have called S work.  2. Hypokalemia & hypomagnesemia . Due to HCTZ, replaced will monitor.  3. Nonspecific troponin elevation. No chest pain or shortness of breath, EKG nonacute, does have history of CAD in the chart but patient denies knowing any history of CAD. Continue aspirin, troponin trend and  non-ACS pattern with stable echocardiogram, seen by cardiology, no further workup. Add low-dose beta blocker and continue home dose statin.  4. Hypertension. Hold ARB and HCTZ combination due to dehydration and hypokalemia, low-dose beta blocker and monitor.  5. Glaucoma. Continue eyedrops.  6. Dyslipidemia. Continue home dose statin.  7. Constipation. Placed on bowel regimen. Offered an enema which she refused.  8. Incidental RUL opacity - PCP to follow was present on a previous CT as well. If fevers continue might CT chest again, if stable then outpatient follow-up with PCP.    Family Communication  :  None  Code Status :  Full  Diet : Heart Healthy  Disposition Plan  :  ? SNF  Consults  :  none  Procedures  :   TTE - Compared to a prior study in 2016, the LVEF is stable at 55-60%. There is Grade 2 DD with elevated LV filling pressure.  Renal US   DVT Prophylaxis  :    Heparin    Lab Results  Component Value Date   PLT 393 10/13/2015    Inpatient Medications  Scheduled Meds: . aspirin  81 mg Oral Daily  . bisacodyl  10 mg Rectal Daily  . carbidopa-levodopa  0.5 tablet Oral TID  . cefTRIAXone (ROCEPHIN)  IV  1 g Intravenous Q24H  . docusate sodium  200 mg Oral BID  . ezetimibe-simvastatin  1 tablet Oral Daily  . heparin  5,000 Units Subcutaneous Q8H  . latanoprost  1 drop Both Eyes QHS  . metoprolol tartrate  25 mg Oral BID  . omega-3 acid ethyl esters  1 g Oral Daily  . polyethylene glycol  17 g Oral BID  . sodium chloride flush  3 mL Intravenous Q12H  . timolol  1 drop Both Eyes Daily   Continuous  Infusions: . sodium chloride     PRN Meds:.acetaminophen **OR** [DISCONTINUED] acetaminophen, HYDROcodone-acetaminophen, magnesium hydroxide, ondansetron **OR** ondansetron (ZOFRAN) IV  Antibiotics  :    Anti-infectives    Start     Dose/Rate Route Frequency Ordered Stop   10/14/15 1500  cefTRIAXone (ROCEPHIN) 1 g in dextrose 5 % 50 mL IVPB     1 g 100 mL/hr over 30 Minutes Intravenous Every 24 hours 10/13/15 1622 10/17/15 1459   10/13/15 1515  cefTRIAXone (ROCEPHIN) 1 g in dextrose 5 % 50 mL IVPB     1 g 100 mL/hr over 30 Minutes Intravenous  Once 10/13/15 1505 10/13/15 1610         Objective:   Vitals:   10/14/15 0448 10/14/15 1348 10/14/15 2117 10/15/15 0538  BP:  (!) 116/48 (!) 161/60 (!) 135/55  Pulse:  63 91 79  Resp:  18 18 18   Temp:  98.3 F (36.8 C) (!) 100.4 F (38 C) 99.3 F (37.4 C)  TempSrc:  Oral Oral Oral  SpO2:  99% 99% 98%  Weight: 62.1 kg (136 lb 14.5 oz)   64.1 kg (141 lb 5 oz)  Height:        Wt Readings from Last 3 Encounters:  10/15/15 64.1 kg (141 lb 5 oz)  10/05/15 60.3 kg (133 lb)  09/08/15 63 kg (139 lb)     Intake/Output Summary (Last 24 hours) at 10/15/15 0839 Last data filed at 10/15/15 0542  Gross per 24 hour  Intake                0 ml  Output  250 ml  Net             -250 ml     Physical Exam  Awake Alert, Oriented X 3, No new F.N deficits, Normal affect [place].AT,PERRAL Supple Neck,No JVD, No cervical lymphadenopathy appriciated.  Symmetrical Chest wall movement, Good air movement bilaterally, CTAB RRR,No Gallops,Rubs or new Murmurs, No Parasternal Heave +ve B.Sounds, Abd Soft, No tenderness, No organomegaly appriciated, No rebound - guarding or rigidity. No Cyanosis, Clubbing or edema, No new Rash or bruise       Data Review:    CBC  Recent Labs Lab 10/13/15 1222  WBC 9.4  HGB 12.8  HCT 38.5  PLT 393  MCV 86.3  MCH 28.7  MCHC 33.2  RDW 14.3    Chemistries   Recent Labs Lab 10/13/15 1222  10/14/15 0759  NA 132* 132*  K 2.9* 4.9  CL 96* 101  CO2 27 24  GLUCOSE 105* 170*  BUN 9 8  CREATININE 0.54 0.68  CALCIUM 9.3 9.0  MG  --  1.5*   ------------------------------------------------------------------------------------------------------------------ No results for input(s): CHOL, HDL, LDLCALC, TRIG, CHOLHDL, LDLDIRECT in the last 72 hours.  No results found for: HGBA1C ------------------------------------------------------------------------------------------------------------------  Recent Labs  10/13/15 1830  TSH 2.574   ------------------------------------------------------------------------------------------------------------------ No results for input(s): VITAMINB12, FOLATE, FERRITIN, TIBC, IRON, RETICCTPCT in the last 72 hours.  Coagulation profile  Recent Labs Lab 10/13/15 1830  INR 1.07    No results for input(s): DDIMER in the last 72 hours.  Cardiac Enzymes  Recent Labs Lab 10/13/15 1222 10/13/15 1830 10/14/15 0048  TROPONINI 0.16* 0.17* 0.15*   ------------------------------------------------------------------------------------------------------------------    Component Value Date/Time   BNP 133.7 (H) 06/30/2014 1030    Micro Results Recent Results (from the past 240 hour(s))  Urine culture     Status: Abnormal (Preliminary result)   Collection Time: 10/13/15  1:07 PM  Result Value Ref Range Status   Specimen Description Urine  Final   Special Requests NONE  Final   Culture 50,000 COLONIES/mL GRAM NEGATIVE RODS (A)  Final   Report Status PENDING  Incomplete  MRSA PCR Screening     Status: None   Collection Time: 10/13/15  5:26 PM  Result Value Ref Range Status   MRSA by PCR NEGATIVE NEGATIVE Final    Comment:        The GeneXpert MRSA Assay (FDA approved for NASAL specimens only), is one component of a comprehensive MRSA colonization surveillance program. It is not intended to diagnose MRSA infection nor to guide or monitor  treatment for MRSA infections.     Radiology Reports Dg Chest 2 View  Result Date: 10/05/2015 CLINICAL DATA:  [place] [address] 1 hour ago. Generalized weakness and dizziness. EXAM: CHEST  2 VIEW COMPARISON:  Chest x-ray 06/30/2014 FINDINGS: The heart is within normal limits in size given the AP projection. There is tortuosity and calcification of the thoracic aorta. There are chronic emphysematous and bronchitic lung changes but no definite acute overlying pulmonary process. No pleural effusion or focal infiltrate. The bony thorax is intact. IMPRESSION: Chronic emphysematous and bronchitic lung changes but no definite acute overlying pulmonary process. Electronically Signed   By: Marijo Sanes M.D.   On: 10/05/2015 18:35  Ct Head Wo Contrast  Result Date: 09/17/2015  [facility] [address] [place], [place] [address] [phone] NEUROIMAGING REPORT STUDY DATE: 09/16/2015 PATIENT NAME: ZAIDEE HOLLOMAN DOB: 11-07-1925 MRN: [id number] EXAM: CT of the head without contrast ORDERING CLINICIAN: Delfino Lovett  August Saucer, MD. PhD CLINICAL HISTORY: 82 year old woman with gait and urinary disturbances. COMPARISON FILMS: CT scan 04/01/2013 TECHNIQUE: CT scan of the head was obtained utilizing 5 mm axial slices from the skull base to the vertex. CONTRAST: None IMAGING SITE: [facility],  [address] FINDINGS: The fourth ventricle is normal in size and appearance. The third and lateral ventricles are mildly enlarged, in proportion to the extent of mild to moderate cortical atrophy, relatively unchanged when compared to the 04/01/2013 CT scan.. No extra-axial fluid collections are seen.  No intracranial hemorrhage.  No evidence of mass effect or midline shift.  The cerebellum, brainstem and deep gray matter appears normal. There are hypodense changes in the periventricular, deep and subcortical white matter of both hemispheres consistent with chronic microvascular ischemic change. This is  essentially unchanged when compared to the prior CT scan. There some fluid noted within the sphenoid sinus, improved when compared to 04/01/2013 CT scan. The orbits and their contents and calvarium are unremarkable.     This CT scan of the head without contrast shows the following: 1.    Mild to moderate cortical atrophy, unchanged when compared to the 04/01/2013 CT scan. 2.    Chronic microvascular ischemic changes in the hemispheres, essentially unchanged when compared to the prior CT scan.   There are no acute brain findings. 3.    A lot of fluid in the sphenoid sinus, less than what was noted in 2015.  INTERPRETING PHYSICIAN: Richard A. Felecia Shelling, MD, PhD Certified in  Neuroimaging by  of Neuroimaging   Dg Chest Port 1 View  Result Date: 10/13/2015 CLINICAL DATA:  Shortness of breath.  Weakness. EXAM: PORTABLE CHEST 1 VIEW COMPARISON:  Radiographs of October 05, 2015. CT scan of June 30, 2014. FINDINGS: Stable cardiomediastinal silhouette. Atherosclerosis of thoracic aorta is noted. No pneumothorax or pleural effusion is noted. Nodular right upper lobe opacity is noted. Left lung is clear. Multilevel osteophyte formation is noted in thoracic spine. IMPRESSION: Aortic atherosclerosis. Nodular right upper lobe opacity is noted which may correspond to abnormality seen on prior CT scan. Follow-up chest CT scan without contrast administration is recommended to ensure stability and rule out neoplasm. Electronically Signed   By: Marijo Conception, M.D.   On: 10/13/2015 15:29    Time Spent in minutes  30   Yamilet Mcfayden K M.D on 10/15/2015 at 8:39 AM  Between 7am to 7pm - Pager - [phone]  After 7pm go to [email or web address] - password [facility]  [facility] -  Office  [phone]

## 2015-10-15 NOTE — Progress Notes (Signed)
Patient c/o no BM, MD ordered soap suds enema, pt refused to have enema administered, MD aware, educated pt on bowel regimen and Miralax to be given

## 2015-10-15 NOTE — Progress Notes (Signed)
Initial Nutrition Assessment  DOCUMENTATION CODES:   Non-severe (moderate) malnutrition in context of chronic illness, Non-severe (moderate) malnutrition in context of social or environmental circumstances  INTERVENTION:  - Will order Magic Cup each day at lunch, this supplement provides 290 kcal and 9 grams of protein - Will order Boost Breeze once/day, this supplement provides 250 kcal and 9 grams of protein. - RD will continue to monitor for needs.   NUTRITION DIAGNOSIS:   Inadequate oral intake related to acute illness, social / environmental circumstances as evidenced by per patient/family report.  GOAL:   Patient will meet greater than or equal to 90% of their needs  MONITOR:   PO intake, Weight trends, Labs, I & O's  REASON FOR ASSESSMENT:   Malnutrition Screening Tool  ASSESSMENT:   82 y.o. female  with history of essential hypertension, bladder and cervical cancer, dyslipidemia, glaucoma, mention of CAD in the chart but patient denies any history of the same who still lives at home uses a walker to ambulate comes in with 5-7 day history of generalized weakness, some dysuria, she came to the ER about 1 week ago and was discharged with oral medications without much benefit. She comes in with persisting symptoms. This time in the ER UA again consistent with UTI, she was also found to be dehydrated with hypokalemia, mild nonspecific troponin elevation without any chest pain or shortness of breath.  Besides above symptoms patient also condones to constipation last bowel movement 3 days ago.  Pt seen for MST. BMI indicates normal weight/borderline overweight status. Per chart review, pt consumed 80% of breakfast. She reports ~25% completion of chicken salad sandwich, raw carrots, and sweet potato for lunch; pt was eating vanilla ice cream during discussion.   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 Pt has been living at [facility] and does not like the food provided at the facility. Due to this and an overall decline in appetite, especially in the past 2 weeks, pt has been eating very small portions. She reports lunch today is similar in size to a typical meal intake for her. Daughter reports that pt was at her house (daughter's) over the weekend and was only eating bites of food during that time. Pt denies abdominal pain or nausea with or without PO intakes now or PTA. She denies chewing difficulties. She denies swallowing difficulties other than some issues with very large pills.   Pt reports that over the past 6 months she has been losing weight. Pt and daughter unable to quantify weight loss but daughter states that she needed to buy pt new clothes d/t changes in weight. Per chart review, pt has gained 2 lbs in the past 2 months and no other recent weight hx available. On admission, pt had reported 18 lb weight loss in the past 6 months. Based on CBW, this would indicate 11% body weight loss which is significant for time frame.   Will order supplements as outlined above and continue to monitor for additional needs. Medications reviewed; 10 mg rectal Dulcolax/day, 200 mg Colace BID, PRN MOM, 2 g IV Mg sulfate x1 dose today, PRN Zofran, 17 g Miralax BID. Labs reviewed; Na: 132 mmol/L, Mg: 1.6 mg/dL. IVF: NS @ 75 mL/hr.    Diet Order:  Diet Heart Room service appropriate? Yes; Fluid consistency: Thin  Skin:  Reviewed, no issues  Last BM:  PTA  Height:   Ht Readings from Last 1  Encounters:  10/13/15 5\' 2"  (1.575 m)    Weight:   Wt Readings from Last 1 Encounters:  10/15/15 141 lb 5 oz (64.1 kg)    Ideal Body Weight:  50 kg  BMI:  Body mass index is 25.85 kg/m.  Estimated Nutritional Needs:   Kcal:  1300-1500  Protein:  55-65 grams  Fluid:  >/= 1.6 L/day  EDUCATION NEEDS:   No education needs identified at this time    Jarome Matin, MS, RD, LDN Inpatient Clinical Dietitian Pager #  [phone] After hours/weekend pager # [phone]

## 2015-10-16 ENCOUNTER — Observation Stay (HOSPITAL_COMMUNITY): Payer: Medicare Other

## 2015-10-16 ENCOUNTER — Encounter (HOSPITAL_COMMUNITY): Payer: Self-pay | Admitting: Radiology

## 2015-10-16 DIAGNOSIS — I779 Disorder of arteries and arterioles, unspecified: Secondary | ICD-10-CM | POA: Diagnosis not present

## 2015-10-16 LAB — BASIC METABOLIC PANEL
ANION GAP: 7 (ref 5–15)
BUN: 8 mg/dL (ref 6–20)
CALCIUM: 8.8 mg/dL — AB (ref 8.9–10.3)
CO2: 23 mmol/L (ref 22–32)
Chloride: 102 mmol/L (ref 101–111)
Creatinine, Ser: 0.5 mg/dL (ref 0.44–1.00)
Glucose, Bld: 121 mg/dL — ABNORMAL HIGH (ref 65–99)
Potassium: 4.5 mmol/L (ref 3.5–5.1)
SODIUM: 132 mmol/L — AB (ref 135–145)

## 2015-10-16 LAB — CBC
HEMATOCRIT: 37.5 % (ref 36.0–46.0)
Hemoglobin: 12.7 g/dL (ref 12.0–15.0)
MCH: 29.5 pg (ref 26.0–34.0)
MCHC: 33.9 g/dL (ref 30.0–36.0)
MCV: 87.2 fL (ref 78.0–100.0)
Platelets: 465 10*3/uL — ABNORMAL HIGH (ref 150–400)
RBC: 4.3 MIL/uL (ref 3.87–5.11)
RDW: 14.8 % (ref 11.5–15.5)
WBC: 10.5 10*3/uL (ref 4.0–10.5)

## 2015-10-16 LAB — MAGNESIUM: Magnesium: 1.8 mg/dL (ref 1.7–2.4)

## 2015-10-16 MED ORDER — GUAIFENESIN-DM 100-10 MG/5ML PO SYRP
10.0000 mL | ORAL_SOLUTION | ORAL | Status: DC | PRN
  Administered 2015-10-16: 10 mL via ORAL
  Filled 2015-10-16: qty 10

## 2015-10-16 NOTE — Progress Notes (Signed)
PT Cancellation Note  Patient Details Name: Stacey Walker MRN: [id number] DOB: May 02, 1925   Cancelled Treatment:    Reason Eval/Treat Not Completed: Fatigue/lethargy limiting ability to participate Pt refused mobility at this time stating she was too tired to participate.   Ravleen Ries,KATHrine E 10/16/2015, 10:26 AM Carmelia Bake, PT, DPT 10/16/2015 Pager: [phone]

## 2015-10-16 NOTE — Progress Notes (Signed)
PROGRESS NOTE                                                                                                                                                                                                             Patient Demographics:    Stacey Walker, is a 82 y.o. female, DOB - 05/15/1925, [id number]  Admit date - 10/13/2015   Admitting Physician Thurnell Lose, MD  Outpatient Primary MD for the patient is Donnajean Lopes, MD  LOS - 0  Chief Complaint  Patient presents with  . Weakness       Brief Narrative   Stacey Walker  is a 82 y.o. female, With history of essential hypertension, bladder and cervical cancer, dyslipidemia, glaucoma, mention of CAD in the chart but patient denies any history of the same who still lives at home uses a walker to ambulate comes in with 5-7 day history of generalized weakness, some dysuria, she came to the ER about 1 week ago and was discharged with oral medications without much benefit. She comes in with persisting symptoms. This time in the ER UA again consistent with UTI, she was also found to be dehydrated with hypokalemia, mild nonspecific troponin elevation without any chest pain or shortness of breath and I was called to admit.   Besides above symptoms patient also condones to constipation last bowel movement 3 days ago, she has no other subjective complaints. No chest pain.     Subjective:    Stacey Walker today has, No headache, No chest pain, No abdominal pain - No Nausea, No new weakness tingling or numbness, No Cough - SOB. She still feels constipated.   Assessment  & Plan :     1. Generalized weakness caused by a combination of UTI and dehydration. Clinically better but had a low-grade fever night of 10/14/2015, she says she has intermittent chills at home for the last several days, will check a renal ultrasound to rule out pyelonephritis, continue IV Rocephin and  gentle hydration for now, PT eval done ? SNF, have called S work.  2. Hypokalemia & hypomagnesemia . Due to HCTZ, replaced will monitor.  3. Nonspecific troponin elevation. No chest pain or shortness of breath, EKG nonacute, does have history of CAD in the chart but patient denies knowing any history of CAD. Continue aspirin, troponin trend and  non-ACS pattern with stable echocardiogram, seen by cardiology, no further workup. Add low-dose beta blocker and continue home dose statin.  4. Hypertension. Hold ARB and HCTZ combination due to dehydration and hypokalemia, low-dose beta blocker and monitor.  5. Glaucoma. Continue eyedrops.  6. Dyslipidemia. Continue home dose statin.  7. Constipation. Placed on bowel regimen. Offered an enema which she refused.  8. Incidental RUL opacity - Was noted on a CT scan 1-1/2 years ago, repeat CT scan shows suspicious changes of slow-growing tumor.  9. ? Aspiration - Speech evaluation, Augmentin added. Dysphagia 1 diet for now.    Family Communication  :  None  Code Status :  Full  Diet : Dysphagia 1 diet  Disposition Plan  :  SNF tomorrow   Consults  :  none  Procedures  :   TTE - Compared to a prior study in 2016, the LVEF is stable at 55-60%. There is Grade 2 DD with elevated LV filling pressure.  Renal US   DVT Prophylaxis  :    Heparin    Lab Results  Component Value Date   PLT 465 (H) 10/16/2015    Inpatient Medications  Scheduled Meds: . amoxicillin-clavulanate  1 tablet Oral Q12H  . aspirin  81 mg Oral Daily  . bisacodyl  10 mg Rectal Daily  . carbidopa-levodopa  0.5 tablet Oral TID  . docusate sodium  200 mg Oral BID  . ezetimibe-simvastatin  1 tablet Oral Daily  . feeding supplement  1 Container Oral Q24H  . heparin  5,000 Units Subcutaneous Q8H  . latanoprost  1 drop Both Eyes QHS  . metoprolol tartrate  25 mg Oral BID  . omega-3 acid ethyl esters  1 g Oral Daily  . polyethylene glycol  17 g Oral BID  .  sodium chloride flush  3 mL Intravenous Q12H  . timolol  1 drop Both Eyes Daily   Continuous Infusions:   PRN Meds:.acetaminophen **OR** [DISCONTINUED] acetaminophen, HYDROcodone-acetaminophen, magnesium hydroxide, ondansetron **OR** ondansetron (ZOFRAN) IV  Antibiotics  :    Anti-infectives    Start     Dose/Rate Route Frequency Ordered Stop   10/15/15 2200  amoxicillin-clavulanate (AUGMENTIN) 875-125 MG per tablet 1 tablet     1 tablet Oral Every 12 hours 10/15/15 1920     10/14/15 1500  cefTRIAXone (ROCEPHIN) 1 g in dextrose 5 % 50 mL IVPB  Status:  Discontinued     1 g 100 mL/hr over 30 Minutes Intravenous Every 24 hours 10/13/15 1622 10/15/15 1924   10/13/15 1515  cefTRIAXone (ROCEPHIN) 1 g in dextrose 5 % 50 mL IVPB     1 g 100 mL/hr over 30 Minutes Intravenous  Once 10/13/15 1505 10/13/15 1610         Objective:   Vitals:   10/15/15 1400 10/15/15 1701 10/15/15 2033 10/16/15 0540  BP: 136/60  (!) 147/47 (!) 149/61  Pulse: 77  86 65  Resp: 18  20 18   Temp: 98.9 F (37.2 C) 98.3 F (36.8 C) 99.2 F (37.3 C) 98.3 F (36.8 C)  TempSrc: Oral Oral Oral Oral  SpO2: 99%  97% 97%  Weight:    65.7 kg (144 lb 13.5 oz)  Height:        Wt Readings from Last 3 Encounters:  10/16/15 65.7 kg (144 lb 13.5 oz)  10/05/15 60.3 kg (133 lb)  09/08/15 63 kg (139 lb)     Intake/Output Summary (Last 24 hours) at 10/16/15 1026 Last data filed  at 10/16/15 0534  Gross per 24 hour  Intake          1246.25 ml  Output              800 ml  Net           446.25 ml     Physical Exam  Awake Alert, Oriented X 3, No new F.N deficits, Normal affect [place].AT,PERRAL Supple Neck,No JVD, No cervical lymphadenopathy appriciated.  Symmetrical Chest wall movement, Good air movement bilaterally, CTAB RRR,No Gallops,Rubs or new Murmurs, No Parasternal Heave +ve B.Sounds, Abd Soft, No tenderness, No organomegaly appriciated, No rebound - guarding or rigidity. No Cyanosis, Clubbing or edema, No  new Rash or bruise       Data Review:    CBC  Recent Labs Lab 10/13/15 1222 10/16/15 0506  WBC 9.4 10.5  HGB 12.8 12.7  HCT 38.5 37.5  PLT 393 465*  MCV 86.3 87.2  MCH 28.7 29.5  MCHC 33.2 33.9  RDW 14.3 14.8    Chemistries   Recent Labs Lab 10/13/15 1222 10/14/15 0759 10/15/15 0836 10/16/15 0506  NA 132* 132*  --  132*  K 2.9* 4.9  --  4.5  CL 96* 101  --  102  CO2 27 24  --  23  GLUCOSE 105* 170*  --  121*  BUN 9 8  --  8  CREATININE 0.54 0.68  --  0.50  CALCIUM 9.3 9.0  --  8.8*  MG  --  1.5* 1.6* 1.8   ------------------------------------------------------------------------------------------------------------------ No results for input(s): CHOL, HDL, LDLCALC, TRIG, CHOLHDL, LDLDIRECT in the last 72 hours.  No results found for: HGBA1C ------------------------------------------------------------------------------------------------------------------  Recent Labs  10/13/15 1830  TSH 2.574   ------------------------------------------------------------------------------------------------------------------ No results for input(s): VITAMINB12, FOLATE, FERRITIN, TIBC, IRON, RETICCTPCT in the last 72 hours.  Coagulation profile  Recent Labs Lab 10/13/15 1830  INR 1.07    No results for input(s): DDIMER in the last 72 hours.  Cardiac Enzymes  Recent Labs Lab 10/13/15 1222 10/13/15 1830 10/14/15 0048  TROPONINI 0.16* 0.17* 0.15*   ------------------------------------------------------------------------------------------------------------------    Component Value Date/Time   BNP 133.7 (H) 06/30/2014 1030    Micro Results Recent Results (from the past 240 hour(s))  Urine culture     Status: Abnormal   Collection Time: 10/13/15  1:07 PM  Result Value Ref Range Status   Specimen Description Urine  Final   Special Requests NONE  Final   Culture 50,000 COLONIES/mL ESCHERICHIA COLI (A)  Final   Report Status 10/15/2015 FINAL  Final   Organism  ID, Bacteria ESCHERICHIA COLI (A)  Final      Susceptibility   Escherichia coli - MIC*    AMPICILLIN >=32 RESISTANT Resistant     CEFAZOLIN <=4 SENSITIVE Sensitive     CEFEPIME <=1 SENSITIVE Sensitive     CEFTAZIDIME <=1 SENSITIVE Sensitive     CEFTRIAXONE <=1 SENSITIVE Sensitive     CIPROFLOXACIN >=4 RESISTANT Resistant     GENTAMICIN <=1 SENSITIVE Sensitive     IMIPENEM <=0.25 SENSITIVE Sensitive     TRIMETH/SULFA >=320 RESISTANT Resistant     AMPICILLIN/SULBACTAM 16 INTERMEDIATE Intermediate     PIP/TAZO <=4 SENSITIVE Sensitive     Extended ESBL NEGATIVE Sensitive     * 50,000 COLONIES/mL ESCHERICHIA COLI  MRSA PCR Screening     Status: None   Collection Time: 10/13/15  5:26 PM  Result Value Ref Range Status   MRSA by PCR NEGATIVE NEGATIVE Final  Comment:        The GeneXpert MRSA Assay (FDA approved for NASAL specimens only), is one component of a comprehensive MRSA colonization surveillance program. It is not intended to diagnose MRSA infection nor to guide or monitor treatment for MRSA infections.     Radiology Reports Dg Chest 2 View  Result Date: 10/05/2015 CLINICAL DATA:  [place] 1 hour ago. Generalized weakness and dizziness. EXAM: CHEST  2 VIEW COMPARISON:  Chest x-ray 06/30/2014 FINDINGS: The heart is within normal limits in size given the AP projection. There is tortuosity and calcification of the thoracic aorta. There are chronic emphysematous and bronchitic lung changes but no definite acute overlying pulmonary process. No pleural effusion or focal infiltrate. The bony thorax is intact. IMPRESSION: Chronic emphysematous and bronchitic lung changes but no definite acute overlying pulmonary process. Electronically Signed   By: Marijo Sanes M.D.   On: 10/05/2015 18:35  Ct Head Wo Contrast  Result Date: 09/17/2015  [facility] [address], [place], [place] [address] [phone] NEUROIMAGING REPORT STUDY DATE: 09/16/2015 PATIENT NAME:  JOZELYN CAPEHART DOB: 29-Apr-1925 MRN: [id number] EXAM: CT of the head without contrast ORDERING CLINICIAN: Richard A. Felecia Shelling, MD. PhD CLINICAL HISTORY: 81 year old woman with gait and urinary disturbances. COMPARISON FILMS: CT scan 04/01/2013 TECHNIQUE: CT scan of the head was obtained utilizing 5 mm axial slices from the skull base to the vertex. CONTRAST: None IMAGING SITE: [facility],  [address] FINDINGS: The fourth ventricle is normal in size and appearance. The third and lateral ventricles are mildly enlarged, in proportion to the extent of mild to moderate cortical atrophy, relatively unchanged when compared to the 04/01/2013 CT scan.. No extra-axial fluid collections are seen.  No intracranial hemorrhage.  No evidence of mass effect or midline shift.  The cerebellum, brainstem and deep gray matter appears normal. There are hypodense changes in the periventricular, deep and subcortical white matter of both hemispheres consistent with chronic microvascular ischemic change. This is essentially unchanged when compared to the prior CT scan. There some fluid noted within the sphenoid sinus, improved when compared to 04/01/2013 CT scan. The orbits and their contents and calvarium are unremarkable.     This CT scan of the head without contrast shows the following: 1.    Mild to moderate cortical atrophy, unchanged when compared to the 04/01/2013 CT scan. 2.    Chronic microvascular ischemic changes in the hemispheres, essentially unchanged when compared to the prior CT scan.   There are no acute brain findings. 3.    A lot of fluid in the sphenoid sinus, less than what was noted in 2015.  INTERPRETING PHYSICIAN: Richard A. Felecia Shelling, MD, PhD Certified in  Neuroimaging by [employer]   Ct Chest Wo Contrast  Result Date: 10/16/2015 CLINICAL DATA:  Evaluate lung nodule. History of bladder cancer and smoking. EXAM: CT CHEST WITHOUT CONTRAST TECHNIQUE: Multidetector CT imaging of the chest  was performed following the standard protocol without IV contrast. COMPARISON:  Radiographs 08/15/2015.  CT 06/30/2014 and 01/02/2008. FINDINGS: Cardiovascular: Again demonstrated is diffuse atherosclerosis of the aorta, great vessels and coronary arteries. No acute vascular findings are demonstrated on noncontrast imaging. The heart size is normal. There is small amount of pericardial fluid versus thickening. Mediastinum/Nodes: There are no enlarged mediastinal, hilar or axillary lymph nodes.Hilar assessment is limited by the lack of intravenous contrast, although the hilar contours appear unchanged. The thyroid gland, trachea and esophagus demonstrate no significant findings. Lungs/Pleura: There  are small dependent pleural effusions bilaterally. Mild nodularity is noted along the nondependent aspect of the right mainstem bronchus (image 60), possibly adherent secretion. Mild emphysema and biapical scarring are noted. There is mild dependent atelectasis at both lung bases. There is no focal or concerning opacity at the left lung base. There is punctate high density within the left lower lobe parenchyma which may be secondary to aspiration. The right upper lobe nodule demonstrated on the more recent prior CT is persistent and appears slightly larger and more solid. This measures 2.1 x 1.1 cm on image 55 and is worrisome for malignancy. No other suspicious pulmonary nodules. Upper abdomen: No suspicious findings are seen within the visualized upper abdomen. There is mild biliary dilatation status post cholecystectomy, grossly stable. Aortic and branch vessel atherosclerosis noted. Musculoskeletal/Chest wall: There is no chest wall mass or suspicious osseous finding. Mild thoracic spine degenerative changes are present. IMPRESSION: 1. The previously demonstrated right upper lobe nodule has enlarged and become more solid since the prior CT of 16 months ago. This finding is concerning for a slow growing malignancy,  likely adenocarcinoma. Metastatic disease from bladder cancer less likely. Tissue sampling and/or PET-CT recommended for further evaluation. 2. New bilateral pleural effusions with associated mild bibasilar atelectasis. No focal left lower lobe airspace disease. 3. No evidence of metastatic disease on noncontrast imaging. 4. Diffuse atherosclerosis, including aortic atherosclerosis. Electronically Signed   By: Richardean Sale M.D.   On: 10/16/2015 08:36   US Renal  Result Date: 10/15/2015 CLINICAL DATA:  Complicating urinary tract infection. EXAM: RENAL / URINARY TRACT ULTRASOUND COMPLETE COMPARISON:  07/20/2004 renal ultrasound. FINDINGS: Right Kidney: Length: 11.9 cm. Echogenicity within normal limits. No mass or hydronephrosis visualized. Prominence of renal sinus adipose tissue. Mild cortical thinning. Left Kidney: Length: 11.7 cm. Echogenicity within normal limits. No mass or hydronephrosis visualized. Prominence of renal sinus adipose tissue. Mild cortical thinning. Bladder: Appears normal for degree of bladder distention. IMPRESSION: 1. Mild bilateral renal sinus lipomatosis, with mild cortical thinning attributable to atrophy bilaterally, but no significant abnormal focal lesion identified. No hydronephrosis or stones identified. Electronically Signed   By: Van Clines M.D.   On: 10/15/2015 16:07   Dg Chest Port 1 View  Result Date: 10/15/2015 CLINICAL DATA:  Wheezing.  History of hypertension and smoking. EXAM: PORTABLE CHEST 1 VIEW COMPARISON:  10/13/2015 FINDINGS: The heart is enlarged. There is atelectasis or early infiltrate at the left lung base. Perihilar bronchitic changes are noted. No pulmonary edema. IMPRESSION: 1. Cardiomegaly without edema. 2. Left lower lobe atelectasis or early infiltrate. Electronically Signed   By: Nolon Nations M.D.   On: 10/15/2015 19:07   Dg Chest Port 1 View  Result Date: 10/13/2015 CLINICAL DATA:  Shortness of breath.  Weakness. EXAM: PORTABLE CHEST 1  VIEW COMPARISON:  Radiographs of October 05, 2015. CT scan of June 30, 2014. FINDINGS: Stable cardiomediastinal silhouette. Atherosclerosis of thoracic aorta is noted. No pneumothorax or pleural effusion is noted. Nodular right upper lobe opacity is noted. Left lung is clear. Multilevel osteophyte formation is noted in thoracic spine. IMPRESSION: Aortic atherosclerosis. Nodular right upper lobe opacity is noted which may correspond to abnormality seen on prior CT scan. Follow-up chest CT scan without contrast administration is recommended to ensure stability and rule out neoplasm. Electronically Signed   By: Marijo Conception, M.D.   On: 10/13/2015 15:29    Time Spent in minutes  30   SINGH,PRASHANT K M.D on 10/16/2015 at 10:26 AM  Between 7am to 7pm - Pager - [phone]  After 7pm go to [email or web address] - password [email or web address]  [facility] -  Office  [phone]

## 2015-10-16 NOTE — Care Management Note (Signed)
Case Management Note  Patient Details  Name: Stacey Walker MRN: [id number] Date of Birth: 05-Sep-1925  Subjective/Objective: Provided dtr w/Advance Beneficiary Notice of Non coverage-dtr voiced understanding.d/c SNF in am.                   Action/Plan:d/c SNF.   Expected Discharge Date:   (unknown)               Expected Discharge Plan:  Skilled Nursing Facility  In-House Referral:  Clinical Social Work  Discharge planning Services  CM Consult  Post Acute Care Choice:  Resumption of Svcs/PTA Provider, Durable Medical Equipment (Com[employer] keepers-nurse's aide;has rw,hover around.) Choice offered to:     DME Arranged:    DME Agency:     HH Arranged:    [place] Agency:     Status of Service:  In process, will continue to follow  If discussed at Long Length of Stay Meetings, dates discussed:    Additional Comments:  Dessa Phi, RN 10/16/2015, 3:31 PM

## 2015-10-17 ENCOUNTER — Observation Stay (HOSPITAL_COMMUNITY): Payer: Medicare Other

## 2015-10-17 DIAGNOSIS — K5900 Constipation, unspecified: Secondary | ICD-10-CM

## 2015-10-17 DIAGNOSIS — Z87891 Personal history of nicotine dependence: Secondary | ICD-10-CM

## 2015-10-17 DIAGNOSIS — R911 Solitary pulmonary nodule: Secondary | ICD-10-CM | POA: Diagnosis not present

## 2015-10-17 DIAGNOSIS — I779 Disorder of arteries and arterioles, unspecified: Secondary | ICD-10-CM | POA: Diagnosis not present

## 2015-10-17 DIAGNOSIS — Z8551 Personal history of malignant neoplasm of bladder: Secondary | ICD-10-CM

## 2015-10-17 DIAGNOSIS — E876 Hypokalemia: Secondary | ICD-10-CM | POA: Diagnosis not present

## 2015-10-17 MED ORDER — DOCUSATE SODIUM 100 MG PO CAPS: 100.0000 mg | ORAL_CAPSULE | Freq: Two times a day (BID) | ORAL | 0 refills | Status: DC

## 2015-10-17 MED ORDER — MAGNESIUM CITRATE PO SOLN: 1.0000 | Freq: Once | ORAL | Status: DC

## 2015-10-17 MED ORDER — BISACODYL 10 MG RE SUPP: 10.0000 mg | Freq: Every day | RECTAL | 0 refills | Status: DC

## 2015-10-17 MED ORDER — MAGNESIUM CITRATE PO SOLN: 1.0000 | Freq: Every day | ORAL | Status: DC | PRN

## 2015-10-17 MED ORDER — POLYETHYLENE GLYCOL 3350 17 G PO PACK
17.0000 g | PACK | Freq: Four times a day (QID) | ORAL | Status: DC
  Administered 2015-10-17: 17 g via ORAL
  Filled 2015-10-17: qty 1

## 2015-10-17 MED ORDER — POLYETHYLENE GLYCOL 3350 17 G PO PACK: 17.0000 g | PACK | Freq: Two times a day (BID) | ORAL | 0 refills | Status: DC

## 2015-10-17 NOTE — Discharge Summary (Signed)
Stacey Walker [id number] DOB: [birth year] DOA: 10/13/2015  PCP: Donnajean Lopes, MD  Admit date: 10/13/2015  Discharge date: 10/17/2015  Admitted From: Home  Disposition:  Home/SNF as decided by the daughter   Recommendations for Outpatient Follow-up:   Follow up with PCP in 1-2 weeks  PCP Please obtain BMP/CBC, 2 view CXR in 1week,  (see Discharge instructions)   PCP Please follow up on the following pending results: None   Home Health: None Equipment/Devices: None Consultations: Dr Marin Olp Discharge Condition: Fair CODE STATUS: DNR Diet Recommendation: Heart Healthy   Chief Complaint  Patient presents with  . Weakness     Brief history of present illness from the day of admission and additional interim summary    NancyJonesis a 82 y.o.female,With history of essential hypertension, bladder and cervical cancer, dyslipidemia, glaucoma, mention of CAD in the chart but patient denies any history of the same who still lives at home uses a walker to ambulate comes in with 5-7 day history of generalized weakness, some dysuria, she came to the ER about 1 week ago and was discharged with oral medications without much benefit. She comes in with persisting symptoms. This time in the ER UA again consistent with UTI, she was also found to be dehydrated with hypokalemia, mild nonspecific troponin elevation without any chest pain or shortness of breath and I was called to admit.   Besides above symptoms patient also condones to constipation last bowel movement 3 days ago, she has no other subjective complaints. No chest pain.  Hospital issues addressed       1. Generalized weakness caused by a combination of UTI and dehydration. He did with Rocephin, renal ultrasound stable, now afebrile, PT eval done ? SNF, have  called S work.  2.Hypokalemia & hypomagnesemia . Due to HCTZ, replaced will monitor.  3.Nonspecific troponin elevation. No chest pain or shortness of breath, EKG nonacute, does have history of CAD in the chart but patient denies knowing any history of CAD. Continue aspirin, troponin trend and non-ACS pattern with stable echocardiogram, seen by cardiology, no further workup. Add low-dose beta blocker and continue home dose statin.  4.Hypertension. Kidney home regimen.  5.Glaucoma. Continue eyedrops.  6.Dyslipidemia. Continue home dose statin.  7.Constipation. Placed on bowel regimen. Offered an enema 3 days in a row which patient refused, she finally agreed to enema today but it was not successful, continue bowel regimen. She is passing flatus and abdominal exam is benign.  8. Incidental RUL opacity - Was noted on a CT scan 1-1/2 years ago, repeat CT scan shows suspicious changes of slow-growing tumor. Patient says she does not want chemotherapy or radiation, per daughter she would like to talk with oncologist for her options, Dr. Marin Olp has been consulted and will see the patient shortly thereafter patient will be discharged to either SNF or home depending on family's preference.  9. ? Aspiration - Speech evaluation done, ruled out aspiration.   Discharge diagnosis     Active Problems:   Hypertension, essential, benign  Dyslipidemia   Bilateral carotid artery disease (HCC)   Acute hypokalemia   UTI (lower urinary tract infection)   Generalized weakness   Malnutrition of moderate degree    Discharge instructions    Discharge Instructions    Diet - low sodium heart healthy    Complete by:  As directed   Discharge instructions    Complete by:  As directed   Follow with Primary MD Donnajean Lopes, MD in 5-7 days   Get CBC, CMP, Magnesium, final Urine culture results checked by Primary MD or SNF MD in 5-7 days ( we routinely change or add medications that can  affect your baseline labs and fluid status, therefore we recommend that you get the mentioned basic workup next visit with your PCP, your PCP may decide not to get them or add new tests based on their clinical decision)   Activity: As tolerated with Full fall precautions use walker/cane & assistance as needed   Disposition SNF   Diet:   Heart Healthy   For Heart failure patients - Check your Weight same time everyday, if you gain over 2 pounds, or you develop in leg swelling, experience more shortness of breath or chest pain, call your Primary MD immediately. Follow Cardiac Low Salt Diet and 1.5 lit/day fluid restriction.   On your next visit with your primary care physician please Get Medicines reviewed and adjusted.   Please request your Prim.MD to go over all Hospital Tests and Procedure/Radiological results at the follow up, please get all Hospital records sent to your Prim MD by signing hospital release before you go home.   If you experience worsening of your admission symptoms, develop shortness of breath, life threatening emergency, suicidal or homicidal thoughts you must seek medical attention immediately by calling 911 or calling your MD immediately  if symptoms less severe.  You Must read complete instructions/literature along with all the possible adverse reactions/side effects for all the Medicines you take and that have been prescribed to you. Take any new Medicines after you have completely understood and accpet all the possible adverse reactions/side effects.   Do not drive, operate heavy machinery, perform activities at heights, swimming or participation in water activities or provide baby sitting services if your were admitted for syncope or siezures until you have seen by Primary MD or a Neurologist and advised to do so again.  Do not drive when taking Pain medications.    Do not take more than prescribed Pain, Sleep and Anxiety Medications  Special Instructions: If  you have smoked or chewed Tobacco  in the last 2 yrs please stop smoking, stop any regular Alcohol  and or any Recreational drug use.  Wear Seat belts while driving.   Please note  You were cared for by a hospitalist during your hospital stay. If you have any questions about your discharge medications or the care you received while you were in the hospital after you are discharged, you can call the unit and asked to speak with the hospitalist on call if the hospitalist that took care of you is not available. Once you are discharged, your primary care physician will handle any further medical issues. Please note that NO REFILLS for any discharge medications will be authorized once you are discharged, as it is imperative that you return to your primary care physician (or establish a relationship with a primary care physician if you do not have one) for your aftercare needs so that they can reassess your need  for medications and monitor your lab values.   Increase activity slowly    Complete by:  As directed      Discharge Medications     Medication List    STOP taking these medications   ciprofloxacin 500 MG tablet Commonly known as:  CIPRO     TAKE these medications   acetaminophen 325 MG tablet Commonly known as:  TYLENOL Take 650 mg by mouth every 6 (six) hours as needed for moderate pain.   aspirin EC 81 MG tablet Take 81 mg by mouth daily.   bisacodyl 10 MG suppository Commonly known as:  DULCOLAX Place 1 suppository (10 mg total) rectally daily.   carbidopa-levodopa 25-100 MG tablet Commonly known as:  SINEMET IR Take 0.5 tablets by mouth 3 (three) times daily.   docusate sodium 100 MG capsule Commonly known as:  COLACE Take 100 mg by mouth at bedtime. What changed:  Another medication with the same name was added. Make sure you understand how and when to take each.   docusate sodium 100 MG capsule Commonly known as:  COLACE Take 1 capsule (100 mg total) by mouth 2  (two) times daily. What changed:  You were already taking a medication with the same name, and this prescription was added. Make sure you understand how and when to take each.   ezetimibe-simvastatin 10-40 MG tablet Commonly known as:  VYTORIN Take 1 tablet by mouth daily.   ipratropium 0.03 % nasal spray Commonly known as:  ATROVENT Place 1 spray into both nostrils 3 (three) times daily.   LUMIGAN 0.01 % Soln Generic drug:  bimatoprost Place 1 drop into both eyes at bedtime.   magnesium citrate Soln Take 296 mLs (1 Bottle total) by mouth daily as needed for severe constipation.   magnesium hydroxide 400 MG/5ML suspension Commonly known as:  MILK OF MAGNESIA Take 30 mLs by mouth daily as needed for mild constipation or moderate constipation.   multivitamin with minerals Tabs tablet Take 1 tablet by mouth daily.   omega-3 acid ethyl esters 1 g capsule Commonly known as:  LOVAZA Take 1 g by mouth daily.   polyethylene glycol packet Commonly known as:  MIRALAX / GLYCOLAX Take 17 g by mouth 2 (two) times daily.   psyllium 95 % Pack Commonly known as:  HYDROCIL/METAMUCIL Take 1 packet by mouth 2 (two) times daily as needed for mild constipation.   timolol 0.5 % ophthalmic solution Commonly known as:  BETIMOL Place 1 drop into both eyes daily.   valsartan-hydrochlorothiazide 320-12.5 MG tablet Commonly known as:  DIOVAN-HCT Take 1 tablet by mouth daily.       Follow-up Information    Donnajean Lopes, MD. Schedule an appointment as soon as possible for a visit in 1 week(s).   Specialty:  Internal Medicine Contact information: [place] [address] [phone]        Glenetta Hew, MD .   Specialty:  Cardiology Why:  Routine Cardiology Follow-up. Please call if needing to be seen prior to this. Contact information: [address] [place] [address] [phone]        Volanda Napoleon, MD. Schedule an appointment as soon  as possible for a visit in 1 week(s).   Specialty:  Oncology Contact information: [address], [address] [place] [address] [phone]           Major procedures and Radiology Reports - PLEASE review detailed and final reports thoroughly  -  Dg Chest 2 View  Result Date: 10/05/2015 CLINICAL DATA:  [place] 1 hour ago. Generalized weakness and dizziness. EXAM: CHEST  2 VIEW COMPARISON:  Chest x-ray 06/30/2014 FINDINGS: The heart is within normal limits in size given the AP projection. There is tortuosity and calcification of the thoracic aorta. There are chronic emphysematous and bronchitic lung changes but no definite acute overlying pulmonary process. No pleural effusion or focal infiltrate. The bony thorax is intact. IMPRESSION: Chronic emphysematous and bronchitic lung changes but no definite acute overlying pulmonary process. Electronically Signed   By: Marijo Sanes M.D.   On: 10/05/2015 18:35  Ct Chest Wo Contrast  Result Date: 10/16/2015 CLINICAL DATA:  Evaluate lung nodule. History of bladder cancer and smoking. EXAM: CT CHEST WITHOUT CONTRAST TECHNIQUE: Multidetector CT imaging of the chest was performed following the standard protocol without IV contrast. COMPARISON:  Radiographs 08/15/2015.  CT 06/30/2014 and 01/02/2008. FINDINGS: Cardiovascular: Again demonstrated is diffuse atherosclerosis of the aorta, great vessels and coronary arteries. No acute vascular findings are demonstrated on noncontrast imaging. The heart size is normal. There is small amount of pericardial fluid versus thickening. Mediastinum/Nodes: There are no enlarged mediastinal, hilar or axillary lymph nodes.Hilar assessment is limited by the lack of intravenous contrast, although the hilar contours appear unchanged. The thyroid gland, trachea and esophagus demonstrate no significant findings. Lungs/Pleura: There are small dependent pleural effusions bilaterally. Mild nodularity is noted along the  nondependent aspect of the right mainstem bronchus (image 60), possibly adherent secretion. Mild emphysema and biapical scarring are noted. There is mild dependent atelectasis at both lung bases. There is no focal or concerning opacity at the left lung base. There is punctate high density within the left lower lobe parenchyma which may be secondary to aspiration. The right upper lobe nodule demonstrated on the more recent prior CT is persistent and appears slightly larger and more solid. This measures 2.1 x 1.1 cm on image 55 and is worrisome for malignancy. No other suspicious pulmonary nodules. Upper abdomen: No suspicious findings are seen within the visualized upper abdomen. There is mild biliary dilatation status post cholecystectomy, grossly stable. Aortic and branch vessel atherosclerosis noted. Musculoskeletal/Chest wall: There is no chest wall mass or suspicious osseous finding. Mild thoracic spine degenerative changes are present. IMPRESSION: 1. The previously demonstrated right upper lobe nodule has enlarged and become more solid since the prior CT of 16 months ago. This finding is concerning for a slow growing malignancy, likely adenocarcinoma. Metastatic disease from bladder cancer less likely. Tissue sampling and/or PET-CT recommended for further evaluation. 2. New bilateral pleural effusions with associated mild bibasilar atelectasis. No focal left lower lobe airspace disease. 3. No evidence of metastatic disease on noncontrast imaging. 4. Diffuse atherosclerosis, including aortic atherosclerosis. Electronically Signed   By: Richardean Sale M.D.   On: 10/16/2015 08:36   US Renal  Result Date: 10/15/2015 CLINICAL DATA:  Complicating urinary tract infection. EXAM: RENAL / URINARY TRACT ULTRASOUND COMPLETE COMPARISON:  07/20/2004 renal ultrasound. FINDINGS: Right Kidney: Length: 11.9 cm. Echogenicity within normal limits. No mass or hydronephrosis visualized. Prominence of renal sinus adipose tissue.  Mild cortical thinning. Left Kidney: Length: 11.7 cm. Echogenicity within normal limits. No mass or hydronephrosis visualized. Prominence of renal sinus adipose tissue. Mild cortical thinning. Bladder: Appears normal for degree of bladder distention. IMPRESSION: 1. Mild bilateral renal sinus lipomatosis, with mild cortical thinning attributable to atrophy bilaterally, but no significant abnormal focal lesion identified. No hydronephrosis or stones identified. Electronically Signed   By: Thayer Jew  Janeece Fitting M.D.   On: 10/15/2015 16:07   Dg Chest Port 1 View  Result Date: 10/15/2015 CLINICAL DATA:  Wheezing.  History of hypertension and smoking. EXAM: PORTABLE CHEST 1 VIEW COMPARISON:  10/13/2015 FINDINGS: The heart is enlarged. There is atelectasis or early infiltrate at the left lung base. Perihilar bronchitic changes are noted. No pulmonary edema. IMPRESSION: 1. Cardiomegaly without edema. 2. Left lower lobe atelectasis or early infiltrate. Electronically Signed   By: Nolon Nations M.D.   On: 10/15/2015 19:07   Dg Chest Port 1 View  Result Date: 10/13/2015 CLINICAL DATA:  Shortness of breath.  Weakness. EXAM: PORTABLE CHEST 1 VIEW COMPARISON:  Radiographs of October 05, 2015. CT scan of June 30, 2014. FINDINGS: Stable cardiomediastinal silhouette. Atherosclerosis of thoracic aorta is noted. No pneumothorax or pleural effusion is noted. Nodular right upper lobe opacity is noted. Left lung is clear. Multilevel osteophyte formation is noted in thoracic spine. IMPRESSION: Aortic atherosclerosis. Nodular right upper lobe opacity is noted which may correspond to abnormality seen on prior CT scan. Follow-up chest CT scan without contrast administration is recommended to ensure stability and rule out neoplasm. Electronically Signed   By: Marijo Conception, M.D.   On: 10/13/2015 15:29    Micro Results     Recent Results (from the past 240 hour(s))  Urine culture     Status: Abnormal   Collection Time: 10/13/15   1:07 PM  Result Value Ref Range Status   Specimen Description Urine  Final   Special Requests NONE  Final   Culture 50,000 COLONIES/mL ESCHERICHIA COLI (A)  Final   Report Status 10/15/2015 FINAL  Final   Organism ID, Bacteria ESCHERICHIA COLI (A)  Final      Susceptibility   Escherichia coli - MIC*    AMPICILLIN >=32 RESISTANT Resistant     CEFAZOLIN <=4 SENSITIVE Sensitive     CEFEPIME <=1 SENSITIVE Sensitive     CEFTAZIDIME <=1 SENSITIVE Sensitive     CEFTRIAXONE <=1 SENSITIVE Sensitive     CIPROFLOXACIN >=4 RESISTANT Resistant     GENTAMICIN <=1 SENSITIVE Sensitive     IMIPENEM <=0.25 SENSITIVE Sensitive     TRIMETH/SULFA >=320 RESISTANT Resistant     AMPICILLIN/SULBACTAM 16 INTERMEDIATE Intermediate     PIP/TAZO <=4 SENSITIVE Sensitive     Extended ESBL NEGATIVE Sensitive     * 50,000 COLONIES/mL ESCHERICHIA COLI  MRSA PCR Screening     Status: None   Collection Time: 10/13/15  5:26 PM  Result Value Ref Range Status   MRSA by PCR NEGATIVE NEGATIVE Final    Comment:        The GeneXpert MRSA Assay (FDA approved for NASAL specimens only), is one component of a comprehensive MRSA colonization surveillance program. It is not intended to diagnose MRSA infection nor to guide or monitor treatment for MRSA infections.     Today   Subjective    Stacey Walker today has no headache,no chest abdominal pain,no new weakness tingling or numbness, feels much better wants to go home today.    Objective   Blood pressure 140/84, pulse 68, temperature 98.3 F (36.8 C), temperature source Oral, resp. rate 18, height 5\' 2"  (1.575 m), weight 65.3 kg (143 lb 15.4 oz), SpO2 97 %.   Intake/Output Summary (Last 24 hours) at 10/17/15 1509 Last data filed at 10/17/15 1114  Gross per 24 hour  Intake              600 ml  Output  625 ml  Net              -25 ml    Exam Awake Alert, Oriented x 3, No new F.N deficits, Normal affect [place].AT,PERRAL Supple Neck,No JVD, No  cervical lymphadenopathy appriciated.  Symmetrical Chest wall movement, Good air movement bilaterally, CTAB RRR,No Gallops,Rubs or new Murmurs, No Parasternal Heave +ve B.Sounds, Abd Soft, Non tender, No organomegaly appriciated, No rebound -guarding or rigidity. No Cyanosis, Clubbing or edema, No new Rash or bruise   Data Review   CBC w Diff: Lab Results  Component Value Date   WBC 10.5 10/16/2015   HGB 12.7 10/16/2015   HCT 37.5 10/16/2015   PLT 465 (H) 10/16/2015   LYMPHOPCT 5 10/05/2015   MONOPCT 6 10/05/2015   EOSPCT 0 10/05/2015   BASOPCT 0 10/05/2015    CMP: Lab Results  Component Value Date   NA 132 (L) 10/16/2015   K 4.5 10/16/2015   CL 102 10/16/2015   CO2 23 10/16/2015   BUN 8 10/16/2015   CREATININE 0.50 10/16/2015   PROT 5.9 (L) 10/05/2015   ALBUMIN 2.8 (L) 10/05/2015   BILITOT 1.2 10/05/2015   ALKPHOS 70 10/05/2015   AST 33 10/05/2015   ALT 18 10/05/2015  .   Total Time in preparing paper work, data evaluation and todays exam - 35 minutes  Thurnell Lose M.D on 10/17/2015 at 3:09 PM  [facility]   Office  [phone]

## 2015-10-17 NOTE — Discharge Instructions (Signed)
Follow with Primary MD Stacey Lopes, MD in 5-7 days   Get CBC, CMP, Magnesium, final Urine culture results checked by Primary MD or SNF MD in 5-7 days ( we routinely change or add medications that can affect your baseline labs and fluid status, therefore we recommend that you get the mentioned basic workup next visit with your PCP, your PCP may decide not to get them or add new tests based on their clinical decision)   Activity: As tolerated with Full fall precautions use walker/cane & assistance as needed   Disposition SNF   Diet:   Heart Healthy   For Heart failure patients - Check your Weight same time everyday, if you gain over 2 pounds, or you develop in leg swelling, experience more shortness of breath or chest pain, call your Primary MD immediately. Follow Cardiac Low Salt Diet and 1.5 lit/day fluid restriction.   On your next visit with your primary care physician please Get Medicines reviewed and adjusted.   Please request your Prim.MD to go over all Hospital Tests and Procedure/Radiological results at the follow up, please get all Hospital records sent to your Prim MD by signing hospital release before you go home.   If you experience worsening of your admission symptoms, develop shortness of breath, life threatening emergency, suicidal or homicidal thoughts you must seek medical attention immediately by calling 911 or calling your MD immediately  if symptoms less severe.  You Must read complete instructions/literature along with all the possible adverse reactions/side effects for all the Medicines you take and that have been prescribed to you. Take any new Medicines after you have completely understood and accpet all the possible adverse reactions/side effects.   Do not drive, operate heavy machinery, perform activities at heights, swimming or participation in water activities or provide baby sitting services if your were admitted for syncope or siezures until you have seen  by Primary MD or a Neurologist and advised to do so again.  Do not drive when taking Pain medications.    Do not take more than prescribed Pain, Sleep and Anxiety Medications  Special Instructions: If you have smoked or chewed Tobacco  in the last 2 yrs please stop smoking, stop any regular Alcohol  and or any Recreational drug use.  Wear Seat belts while driving.   Please note  You were cared for by a hospitalist during your hospital stay. If you have any questions about your discharge medications or the care you received while you were in the hospital after you are discharged, you can call the unit and asked to speak with the hospitalist on call if the hospitalist that took care of you is not available. Once you are discharged, your primary care physician will handle any further medical issues. Please note that NO REFILLS for any discharge medications will be authorized once you are discharged, as it is imperative that you return to your primary care physician (or establish a relationship with a primary care physician if you do not have one) for your aftercare needs so that they can reassess your need for medications and monitor your lab values.

## 2015-10-17 NOTE — Evaluation (Signed)
Clinical/Bedside Swallow Evaluation Patient Details  Name: Stacey Walker MRN: [id number] Date of Birth: 18-Jun-1925  Today's Date: 10/17/2015 Time: SLP Start Time (ACUTE ONLY): 1030 SLP Stop Time (ACUTE ONLY): 1057 SLP Time Calculation (min) (ACUTE ONLY): 27 min  Past Medical History:  Past Medical History:  Diagnosis Date  . Asymptomatic carotid artery stenosis    Dopplers January 2016: RICA 50-69% tortuous (no change; L ICA ~70%, tortuous (no change), bilateral subclavian arteries <50%  . Bladder cancer ([place])   . Cervical cancer ([place])   . Coronary artery disease   . Dyslipidemia    Managed by PCP. On statin  . Glaucoma   . Hypertension   . Hypertension, essential, benign   . Moderate Right-sided carotid artery disease    50-70% stenosis on the right carotid artery from 2011. Also noted less than 50% right and left subclavian artery stenosis.  . Parkinson's disease [facility])    Past Surgical History:  Past Surgical History:  Procedure Laterality Date  . ABDOMINAL HYSTERECTOMY    . BLADDER TUMOR EXCISION    . Carotid Dopplers  July 2011   50-70% stenosis of the right carotid with <50% stenosis of both the right and left subclavian arteries.  . CHOLECYSTECTOMY    . NM MYOVIEW LTD  August 2007   No ischemia or infarction  . TRANSTHORACIC ECHOCARDIOGRAM  August 7   Normal EF, impaired relaxation with mildly sclerotic aortic valve but no stenosis. Mild left atrial dilation.   HPI:  82 yo female adm to [facility] with weakness.  PMH + essential hypertension, bladder and cervical cancer, dyslipidemia, glaucoma, mention of CAD, admit with 5-7 day history of generalized weakness, some dysuria, she came to the ER about 1 week ago and was discharged with oral medications without much benefit. She comes in with persisting symptoms. This time in the ER UA again consistent with UTI.  Pt is ready to receive soap suds enema upon SLP entrance to room.  Swallow eval ordered. CT chest 8/10 shows concern for  possible RUL nodule - ? opacity, ? adenocarcinoma.  [name], daughter, present and reports pt has had a steady decline with progressive weight loss.    Assessment / Plan / Recommendation Clinical Impression  Pt presents with functional oropharyngeal swallow ability.  No indication of airway compromise nor severe dysphagia.  Pt does admit to issue with swallowing potassium pill duirng hospital coarse = and is now taking pills with applesauce.  CN exam unremarkable.   No SLP follow up indicated, recommend regular/thin, educated pt to dysphagia mitigation strategies and xerostomia tips.  Thanks for this consult.     Aspiration Risk  Mild aspiration risk    Diet Recommendation Regular;Thin liquid   Liquid Administration via: Cup;Straw Medication Administration: Whole meds with puree Supervision: Patient able to self feed Compensations: Slow rate;Small sips/bites;Minimize environmental distractions (start meals with liquids) Postural Changes: Seated upright at 90 degrees    Other  Recommendations Oral Care Recommendations: Oral care BID   Follow up Recommendations  None    Frequency and Duration     n/a       Prognosis   n/a     Swallow Study   General Date of Onset: 10/17/15 HPI: 82 yo female adm to [facility] with weakness.  PMH + essential hypertension, bladder and cervical cancer, dyslipidemia, glaucoma, mention of CAD, admit with 5-7 day history of generalized weakness, some dysuria, she came to the ER about 1 week ago and was discharged with oral  medications without much benefit. She comes in with persisting symptoms. This time in the ER UA again consistent with UTI.  Pt is ready to receive soap suds enema upon SLP entrance to room.  Swallow eval ordered. CT chest 8/10 shows concern for possible RUL nodule - ? opacity, ? adenocarcinoma.   Type of Study: Bedside Swallow Evaluation Diet Prior to this Study: Dysphagia 1 (puree);Thin liquids Temperature Spikes Noted: No Respiratory Status:  Room air History of Recent Intubation: Yes Behavior/Cognition: Alert;Cooperative;Pleasant mood Oral Cavity Assessment: Within Functional Limits Oral Care Completed by SLP: No Oral Cavity - Dentition: Adequate natural dentition Vision: Functional for self-feeding Self-Feeding Abilities: Able to feed self Patient Positioning: Upright in bed Baseline Vocal Quality: Normal Volitional Cough: Strong Volitional Swallow: Able to elicit    Oral/Motor/Sensory Function Overall Oral Motor/Sensory Function: Within functional limits   Ice Chips Ice chips: Not tested   Thin Liquid Thin Liquid: Within functional limits Presentation: Cup;Straw    Nectar Thick Nectar Thick Liquid: Not tested   Honey Thick Honey Thick Liquid: Not tested   Puree Puree: Not tested   Solid   GO   Solid: Within functional limits Presentation: Self Fed    Functional Assessment Tool Used: clinical judgement Functional Limitations: Swallowing Swallow Current Status [id number]): At least 1 percent but less than 20 percent impaired, limited or restricted Swallow Goal Status [phone]): At least 1 percent but less than 20 percent impaired, limited or restricted Swallow Discharge Status [phone]): At least 1 percent but less than 20 percent impaired, limited or restricted   Luanna Salk, [place] [facility] SLP [phone]

## 2015-10-17 NOTE — Care Management Note (Signed)
Case Management Note  Patient Details  Name: Stacey Walker MRN: [id number] Date of Birth: 18-Jul-1925  Subjective/Objective:                    Action/Plan:d/c SNF   Expected Discharge Date:   (unknown)               Expected Discharge Plan:  [place]  In-House Referral:  Clinical Social Work  Discharge planning Services  CM Consult  Post Acute Care Choice:  Resumption of Svcs/PTA Provider, Durable Medical Equipment (Comfort keepers-nurse's aide;has rw,hover around.) Choice offered to:     DME Arranged:    DME Agency:     HH Arranged:    [place] Agency:     Status of Service:  Completed, signed off  If discussed at [employer], dates discussed:    Additional Comments:  Dessa Phi, RN 10/17/2015, 11:54 AM

## 2015-10-17 NOTE — Clinical Social Work Placement (Signed)
Patient is set to discharge to [facility] today. Patient & daughter, [name] at bedside aware. Discharge packet given to RN, Susie. RN to call for transport when ready (ph#: [phone]).    Raynaldo Opitz, [facility] Clinical Social Worker cell #: [phone]    CLINICAL SOCIAL WORK PLACEMENT  NOTE  Date:  10/17/2015  Patient Details  Name: Stacey Walker MRN: [id number] Date of Birth: February 27, 1926  Clinical Social Work is seeking post-discharge placement for this patient at the [place] level of care (*CSW will initial, date and re-position this form in  chart as items are completed):  Yes   Patient/family provided with [place] Work Department's list of facilities offering this level of care within the geographic area requested by the patient (or if unable, by the patient's family).  Yes   Patient/family informed of their freedom to choose among providers that offer the needed level of care, that participate in Medicare, Medicaid or managed care program needed by the patient, have an available bed and are willing to accept the patient.  Yes   Patient/family informed of [employer]'s ownership interest in [facility] and [facility], as well as of the fact that they are under no obligation to receive care at these facilities.  PASRR submitted to EDS on       PASRR number received on       Existing PASRR number confirmed on 10/14/15     FL2 transmitted to all facilities in geographic area requested by pt/family on 10/14/15     FL2 transmitted to all facilities within larger geographic area on       Patient informed that his/her managed care company has contracts with or will negotiate with certain facilities, including the following:        Yes   Patient/family informed of bed offers received.  Patient chooses bed at [facility]     Physician recommends and patient chooses bed at      Patient to be  transferred to [facility] on 10/17/15.  Patient to be transferred to facility by [facility]AR     Patient family notified on 10/17/15 of transfer.  Name of family member notified:  patient's daughter at bedside     PHYSICIAN       Additional Comment:    _______________________________________________ Standley Brooking, LCSW 10/17/2015, 3:57 PM

## 2015-10-17 NOTE — Care Management Note (Signed)
Case Management Note  Patient Details  Name: Stacey Walker MRN: [id number] Date of Birth: 1925/03/18  Subjective/Objective:[name]-Dtr @ front desk very concerned about when d/c will take place-she was waiting on Dr. Marin Olp to cons-then she left floor. Dtr was so frustrated that she said I will take my mom home w/home care.MD notified-MD wanted this CM to contact dtr to confirm d/c plan-dtr said I will call you back.Dtr called me back to confirm that she wants her mom @ SNF.MD notified,CSW notified.  Dtr asked that Dr. Marin Olp check the chest scan in April, & the chest xray done yesterday & then call her @ [phone]-nurse Pam aware of the situation.                  Action/Plan:d/c SNF.   Expected Discharge Date:   (unknown)               Expected Discharge Plan:  Skilled Nursing Facility  In-House Referral:  Clinical Social Work  Discharge planning Services  CM Consult  Post Acute Care Choice:  Resumption of Svcs/PTA Provider, Durable Medical Equipment (Comfort keepers-nurse's aide;has rw,hover around.) Choice offered to:  (P) HC POA / Guardian  DME Arranged:    DME Agency:     HH Arranged:  (P) PT, OT, Nurse's Aide HH Agency:     Status of Service:  Completed, signed off  If discussed at [place] of Stay Meetings, dates discussed:    Additional Comments:  Dessa Phi, RN 10/17/2015, 3:38 PM

## 2015-10-17 NOTE — Consult Note (Signed)
Referral MD  Reason for Referral: RUL nodule  Chief Complaint  Patient presents with  . Weakness  : I really do not want to see you today!!  I am very constipated.  HPI: Stacey Walker is a very nice 82 year old white female. She is originally from [place]. She acts he has been in [place] for probably 50 years or more. She actually worked in Geologist, engineering in the 19s. She probably is one of the first women who did work in Event organ[employer].  Back in April 2016, she had some cough and shortness of breath. She had a CT angiogram done. This showed a 2.1 x 0.9 cm lesion in the right upper lobe. It had some groundglass opacity. There is no adenopathy.  For whatever reason, there is no follow-up for this. Patient now has been admitted because of severe constipation. She underwent another CT scan. This was without contrast. It showed that this nodule had grown to 2.1 x 1.1 cm. No other abnormalities were noted. The nodule was slightly more solid.  She still has a lot of constipation.  Of note she had a remote history of bladder cancer. This is back in 2000. She was treated with surgical resection alone. She was a smoker. She stopped back in the 1960s. She probably smoked for maybe 20 years or more.  She does not have a cough. She has no shortness of breath. She is very deconditioned. She has Parkinson's. She gets around a rolling walker.  She lives at [place].  I spoke with her daughter who is a retired Marine scientist. Her daughter just had surgery about 2 weeks ago.  She had some lab work done yesterday. This all looks fairly decent.  We are asked to see her to offer her options if she wishes to have any intervention for this lung nodule.    Past Medical History:  Diagnosis Date  . Asymptomatic carotid artery stenosis    Dopplers January 2016: RICA 50-69% tortuous (no change; L ICA ~70%, tortuous (no change), bilateral subclavian arteries <50%  . Bladder cancer ([place])   . Cervical  cancer ([place])   . Coronary artery disease   . Dyslipidemia    Managed by PCP. On statin  . Glaucoma   . Hypertension   . Hypertension, essential, benign   . Moderate Right-sided carotid artery disease    50-70% stenosis on the right carotid artery from 2011. Also noted less than 50% right and left subclavian artery stenosis.  . Parkinson's disease ([place])   :  Past Surgical History:  Procedure Laterality Date  . ABDOMINAL HYSTERECTOMY    . BLADDER TUMOR EXCISION    . Carotid Dopplers  July 2011   50-70% stenosis of the right carotid with <50% stenosis of both the right and left subclavian arteries.  . CHOLECYSTECTOMY    . NM MYOVIEW LTD  August 2007   No ischemia or infarction  . TRANSTHORACIC ECHOCARDIOGRAM  August 7   Normal EF, impaired relaxation with mildly sclerotic aortic valve but no stenosis. Mild left atrial dilation.  :   Current Facility-Administered Medications:  .  acetaminophen (TYLENOL) tablet 650 mg, 650 mg, Oral, Q6H PRN, 650 mg at 10/17/15 0258 **OR** [DISCONTINUED] acetaminophen (TYLENOL) suppository 650 mg, 650 mg, Rectal, Q6H PRN, Thurnell Lose, MD .  amoxicillin-clavulanate (AUGMENTIN) 875-125 MG per tablet 1 tablet, 1 tablet, Oral, Q12H, Thurnell Lose, MD, 1 tablet at 10/17/15 1009 .  aspirin chewable tablet 81 mg, 81 mg, Oral, Daily,  Thurnell Lose, MD, 81 mg at 10/17/15 1009 .  bisacodyl (DULCOLAX) suppository 10 mg, 10 mg, Rectal, Daily, Thurnell Lose, MD, 10 mg at 10/17/15 1000 .  carbidopa-levodopa (SINEMET IR) 25-100 MG per tablet immediate release 0.5 tablet, 0.5 tablet, Oral, TID, Thurnell Lose, MD, 0.5 tablet at 10/17/15 1554 .  docusate sodium (COLACE) capsule 200 mg, 200 mg, Oral, BID, Thurnell Lose, MD, 200 mg at 10/15/15 2151 .  ezetimibe-simvastatin (VYTORIN) 10-40 MG per tablet 1 tablet, 1 tablet, Oral, Daily, Thurnell Lose, MD, 1 tablet at 10/16/15 1700 .  feeding supplement (BOOST / RESOURCE BREEZE) liquid 1 Container, 1  Container, Oral, Q24H, Thurnell Lose, MD, 1 Container at 10/16/15 1934 .  guaiFENesin-dextromethorphan (ROBITUSSIN DM) 100-10 MG/5ML syrup 10 mL, 10 mL, Oral, Q4H PRN, Thurnell Lose, MD, 10 mL at 10/16/15 1816 .  heparin injection 5,000 Units, 5,000 Units, Subcutaneous, Q8H, Thurnell Lose, MD, 5,000 Units at 10/17/15 0548 .  HYDROcodone-acetaminophen (NORCO/VICODIN) 5-325 MG per tablet 1 tablet, 1 tablet, Oral, Q6H PRN, Thurnell Lose, MD, 1 tablet at 10/16/15 2149 .  latanoprost (XALATAN) 0.005 % ophthalmic solution 1 drop, 1 drop, Both Eyes, QHS, Thurnell Lose, MD, 1 drop at 10/16/15 2132 .  magnesium citrate solution 1 Bottle, 1 Bottle, Oral, Once, Thurnell Lose, MD .  magnesium hydroxide (MILK OF MAGNESIA) suspension 30 mL, 30 mL, Oral, Daily PRN, Thurnell Lose, MD, 30 mL at 10/16/15 1816 .  metoprolol tartrate (LOPRESSOR) tablet 25 mg, 25 mg, Oral, BID, Thurnell Lose, MD, 25 mg at 10/17/15 1009 .  omega-3 acid ethyl esters (LOVAZA) capsule 1 g, 1 g, Oral, Daily, Thurnell Lose, MD, 1 g at 10/15/15 0853 .  ondansetron (ZOFRAN) tablet 4 mg, 4 mg, Oral, Q6H PRN **OR** ondansetron (ZOFRAN) injection 4 mg, 4 mg, Intravenous, Q6H PRN, Thurnell Lose, MD, 4 mg at 10/14/15 0147 .  polyethylene glycol (MIRALAX / GLYCOLAX) packet 17 g, 17 g, Oral, QID, Thurnell Lose, MD, 17 g at 10/17/15 1342 .  sodium chloride flush (NS) 0.9 % injection 3 mL, 3 mL, Intravenous, Q12H, Thurnell Lose, MD, 3 mL at 10/16/15 2132 .  timolol (TIMOPTIC) 0.5 % ophthalmic solution 1 drop, 1 drop, Both Eyes, Daily, Thurnell Lose, MD, 1 drop at 10/17/15 1010:  . amoxicillin-clavulanate  1 tablet Oral Q12H  . aspirin  81 mg Oral Daily  . bisacodyl  10 mg Rectal Daily  . carbidopa-levodopa  0.5 tablet Oral TID  . docusate sodium  200 mg Oral BID  . ezetimibe-simvastatin  1 tablet Oral Daily  . feeding supplement  1 Container Oral Q24H  . heparin  5,000 Units Subcutaneous Q8H  . latanoprost  1  drop Both Eyes QHS  . magnesium citrate  1 Bottle Oral Once  . metoprolol tartrate  25 mg Oral BID  . omega-3 acid ethyl esters  1 g Oral Daily  . polyethylene glycol  17 g Oral QID  . sodium chloride flush  3 mL Intravenous Q12H  . timolol  1 drop Both Eyes Daily  :  No Known Allergies:  Family History  Problem Relation Age of Onset  . Dementia Mother   . Lung cancer Father   :  Social History   Social History  . Marital status: Widowed    Spouse name: N/A  . Number of children: N/A  . Years of education: N/A   Occupational History  . Not on  file.   Social History Main Topics  . Smoking status: Former Smoker    Quit date: 03/08/1978  . Smokeless tobacco: Never Used  . Alcohol use Yes     Comment: occas  . Drug use: No  . Sexual activity: Not on file   Other Topics Concern  . Not on file   Social History Narrative   Widowed mother of one, grandmother of one. She quit smoking in 1980. She has a rare alcohol beverage from time to time. She currently lives at [place] on [address].   She is able to get around there without to much difficulty.   During previous visit, she voiced the desire for DO NOT RESUSCITATE and DO NOT INTUBATE. She also is with the desire to avoid any invasive procedures.   :  Pertinent items are noted in HPI.  Exam: Patient Vitals for the past 24 hrs:  BP Temp Temp src Pulse Resp SpO2 Weight  10/17/15 1500 140/84 98.3 F (36.8 C) Oral 68 18 97 % -  10/17/15 0500 - - - - - - 143 lb 15.4 oz (65.3 kg)  10/17/15 0413 (!) 154/75 98.1 F (36.7 C) Oral 65 18 96 % -  10/16/15 2035 (!) 162/67 97.7 F (36.5 C) Oral 71 (!) 22 93 % -    As above    Recent Labs  10/16/15 0506  WBC 10.5  HGB 12.7  HCT 37.5  PLT 465*    Recent Labs  10/16/15 0506  NA 132*  K 4.5  CL 102  CO2 23  GLUCOSE 121*  BUN 8  CREATININE 0.50  CALCIUM 8.8*    Blood smear review:  None  Pathology: None     Assessment and Plan:  Ms.  Walker is an 82 year old white female. She has a right upper lobe lung nodule which has grown minimally in a year and a half. I would have to think though that this is in all likelihood a bronchogenic carcinoma. It might be a low-grade adenocarcinoma. I would think that after a year and a half, that if this was aggressive, that would've grown much more and probably would have adenopathy in the hilum or mediastinum.  I really think we have to "look at the big picture". I would not think that a biopsy would change management on her. I'm not sure she would even want to have any intervention.  I think the only intervention I would recommend would be stereotactic radiosurgery. I think that this would be a very logical way of treating this. It would be outpatient. It would be painless. And it would be short. This would be all amenable I think to the patient.  She is very "cranky" right now because of the constipation issues. This, in her opinion, is not being managed.  I really would not think that this nodule in the right upper lung would cause her problems for a while. However, given that she does have a history of tobacco use and this nodule is slightly larger, I think there is a very good likelihood of this being malignant.  Of course, could be up to the radiation therapist to decide what other tests need to be done. I think from my point of view, I really think that just having the radiosurgery done would be very reasonable.  I talked to her daughter on the phone. I gave her my opinion. She said she will talk to her mom about all of this.  Ms.  Walker is nice. I just feel bad that she is having these issues with constipation.  If we can help as an outpatient, I certainly would. I told her daughter to let me know.  Lattie Haw, MD  Jeneen Rinks 1:5-7

## 2015-10-17 NOTE — Progress Notes (Signed)
EMS arrived to transport patient to [facility]. VSS. EMS unable to transport walker and flowers. Patient eyeglasses were also found after patient left. Patient belongings placed at nurses station with patient labels. This RN called suzanne (daughter) and left a voicemail stating patient has been picked up by EMS and is being transported to facility. This RN also informed family member of left behind belongings.

## 2015-11-03 NOTE — Telephone Encounter (Signed)
Closed encounter °

## 2015-12-09 ENCOUNTER — Ambulatory Visit: Payer: Medicare Other | Admitting: Neurology

## 2015-12-11 ENCOUNTER — Ambulatory Visit (INDEPENDENT_AMBULATORY_CARE_PROVIDER_SITE_OTHER): Payer: Medicare Other | Admitting: Neurology

## 2015-12-11 ENCOUNTER — Encounter: Payer: Self-pay | Admitting: Neurology

## 2015-12-11 VITALS — BP 120/74 | HR 68 | Resp 18 | Ht 62.0 in | Wt 130.0 lb

## 2015-12-11 DIAGNOSIS — G20A1 Parkinson's disease without dyskinesia, without mention of fluctuations: Secondary | ICD-10-CM

## 2015-12-11 DIAGNOSIS — G20 Parkinson's disease: Secondary | ICD-10-CM | POA: Diagnosis not present

## 2015-12-11 DIAGNOSIS — R269 Unspecified abnormalities of gait and mobility: Secondary | ICD-10-CM

## 2015-12-11 DIAGNOSIS — R251 Tremor, unspecified: Secondary | ICD-10-CM | POA: Diagnosis not present

## 2015-12-11 DIAGNOSIS — K5909 Other constipation: Secondary | ICD-10-CM

## 2015-12-11 DIAGNOSIS — N3941 Urge incontinence: Secondary | ICD-10-CM

## 2015-12-11 MED ORDER — TRAZODONE HCL 50 MG PO TABS: 50.0000 mg | ORAL_TABLET | Freq: Every day | ORAL | 3 refills | Status: DC

## 2015-12-11 MED ORDER — CARBIDOPA-LEVODOPA 25-100 MG PO TABS: 1.0000 | ORAL_TABLET | Freq: Three times a day (TID) | ORAL | 3 refills | Status: DC

## 2015-12-11 NOTE — Progress Notes (Signed)
[facility]  PATIENT: Stacey Walker DOB: 11-26-1925  REFERRING DOCTOR OR PCP:  Leanna Battles SOURCE: patient, daughter, EMR records, CT images in PACS  _________________________________   HISTORICAL  CHIEF COMPLAINT:  Chief Complaint  Patient presents with  . Tremors    Sts. tremors are improved since starting Sinemet, but she is experiencing constipation and difficulty staying asleep, which she believes are due to the Sinemet.  She is currently taking Trazodone 25mg --sleeps til 3am with this but is unable to go back to sleep once she's up.  In August, she was hospitalized at [facility] with a uti.  Spent 5 days in rehab.  Sts. is gernerally weaker/fim   HISTORY OF PRESENT ILLNESS:  Stacey Walker is an 82 year old right-handed woman with tremor and gait disturbance with much more difficulty with gait since April 2016.   The worsening was in association with an aspiration pneumonia where she was hospitalized and then received physical therapy afterwards. Her ability to walk was down from 300 feet to 75 feet. When I first evaluated her I was concerned that she had a "magnetic gait" and ordered MRI of the head to rule out normal pressure hydrocephalus and MRI of the cervical spine to rule out cervical spine stenosis/myelopathy. She chose not to do the studies.     When she returned 08/29/14, she has improved and was practically back at her baseline, able to walk 300 feet.        However, in later 2016, gait worsened again.   Stride worsened again and she noted a mild tremor.   She started Sinemet at the last visit in July.    She titrated up to a full pill tid and gait was improved and tremor greatly improved.    However, she had much more constipation (was hospitalized for impaction).  When she backed off to 1/2 po tid, gait worsened again and she went back to 1 pill po tid.    She takes Miralax qod and colace daily.   Miralax usually helps by the next morning but stools often  still hard.    She is doing well cognitively.   The daughter helps her with the bills due to her poor eyesight and handwriting. Rarely, she has mild verbal fluency issues and is slightly forgetful but not more so than last year.  She has some urinary urgency and rare incontinence as she enters the bathroom.    She has carotid arterial stenosis, with with 60-70% stenosis on the right and 70% on the left.  She also has about 50% bilateral subclavian stenosis.  REVIEW OF SYSTEMS: Constitutional: No fevers, chills, sweats, or change in appetite.  She notes fatigue and is sometimes sleepy. Eyes: Poor vision due to macular degeneration. Ear, nose and throat: No hearing loss, ear pain, nasal congestion, sore throat Cardiovascular: No chest pain, palpitations.   She has carotid artery stenosis. Respiratory: No shortness of breath at rest or with exertion.   No wheezes GastrointestinaI: No nausea, vomiting, diarrhea, abdominal pain, fecal incontinence.  She has noticed some difficulty swallowing. She has occasional constipation. Genitourinary: No dysuria, urinary retention or frequency.  No nocturia. Musculoskeletal: No neck pain, back pain.  Has some hip tenderness and had hip surgery in 2009. Integumentary: No rash.  Notes some moles and itching. Neurological: as above Psychiatric: No depression at this time.  No anxiety Endocrine: No palpitations, diaphoresis, change in appetite, change in weigh or increased thirst Hematologic/Lymphatic: No anemia, purpura, petechiae. Allergic/Immunologic: No  itchy/runny eyes, rashes.  Sometimes has runny nose.  ALLERGIES: No Known Allergies  HOME MEDICATIONS:  Current Outpatient Prescriptions:  .  acetaminophen (TYLENOL) 325 MG tablet, Take 650 mg by mouth every 6 (six) hours as needed for moderate pain., Disp: , Rfl:  .  aspirin EC 81 MG tablet, Take 81 mg by mouth daily., Disp: , Rfl:  .  bisacodyl (DULCOLAX) 10 MG suppository, Place 1 suppository (10  mg total) rectally daily., Disp: 12 suppository, Rfl: 0 .  carbidopa-levodopa (SINEMET IR) 25-100 MG tablet, Take 1 tablet by mouth 3 (three) times daily., Disp: 270 tablet, Rfl: 3 .  docusate sodium (COLACE) 100 MG capsule, Take 100 mg by mouth at bedtime., Disp: , Rfl:  .  ezetimibe-simvastatin (VYTORIN) 10-40 MG tablet, Take 1 tablet by mouth daily., Disp: , Rfl:  .  ipratropium (ATROVENT) 0.03 % nasal spray, Place 1 spray into both nostrils 3 (three) times daily. , Disp: , Rfl:  .  LUMIGAN 0.01 % SOLN, Place 1 drop into both eyes at bedtime., Disp: , Rfl:  .  Multiple Vitamin (MULTIVITAMIN WITH MINERALS) TABS tablet, Take 1 tablet by mouth daily., Disp: , Rfl:  .  omega-3 acid ethyl esters (LOVAZA) 1 g capsule, Take 1 g by mouth daily., Disp: , Rfl:  .  polyethylene glycol (MIRALAX / GLYCOLAX) packet, Take 17 g by mouth 2 (two) times daily., Disp: 14 each, Rfl: 0 .  timolol (BETIMOL) 0.5 % ophthalmic solution, Place 1 drop into both eyes daily. , Disp: , Rfl:  .  valsartan (DIOVAN) 320 MG tablet, Take 320 mg by mouth daily., Disp: , Rfl:  .  magnesium citrate SOLN, Take 296 mLs (1 Bottle total) by mouth daily as needed for severe constipation. (Patient not taking: Reported on 12/11/2015), Disp: 195 mL, Rfl:  .  magnesium hydroxide (MILK OF MAGNESIA) 400 MG/5ML suspension, Take 30 mLs by mouth daily as needed for mild constipation or moderate constipation. , Disp: , Rfl:  .  psyllium (HYDROCIL/METAMUCIL) 95 % PACK, Take 1 packet by mouth 2 (two) times daily as needed for mild constipation. , Disp: , Rfl:  .  traZODone (DESYREL) 50 MG tablet, Take 1 tablet (50 mg total) by mouth at bedtime., Disp: 90 tablet, Rfl: 3 .  valsartan-hydrochlorothiazide (DIOVAN-HCT) 320-12.5 MG per tablet, Take 1 tablet by mouth daily., Disp: , Rfl:   PAST MEDICAL HISTORY: Past Medical History:  Diagnosis Date  . Asymptomatic carotid artery stenosis    Dopplers January 2016: RICA 50-69% tortuous (no change; L ICA  ~70%, tortuous (no change), bilateral subclavian arteries <50%  . Bladder cancer ([place])   . Cervical cancer ([place])   . Coronary artery disease   . Dyslipidemia    Managed by PCP. On statin  . Glaucoma   . Hypertension   . Hypertension, essential, benign   . Moderate Right-sided carotid artery disease    50-70% stenosis on the right carotid artery from 2011. Also noted less than 50% right and left subclavian artery stenosis.  . Parkinson's disease ([place])     PAST SURGICAL HISTORY: Past Surgical History:  Procedure Laterality Date  . ABDOMINAL HYSTERECTOMY    . BLADDER TUMOR EXCISION    . Carotid Dopplers  July 2011   50-70% stenosis of the right carotid with <50% stenosis of both the right and left subclavian arteries.  . CHOLECYSTECTOMY    . NM MYOVIEW LTD  August 2007   No ischemia or infarction  . TRANSTHORACIC ECHOCARDIOGRAM  August  7   Normal EF, impaired relaxation with mildly sclerotic aortic valve but no stenosis. Mild left atrial dilation.    FAMILY HISTORY: Family History  Problem Relation Age of Onset  . Dementia Mother   . Lung cancer Father     SOCIAL HISTORY:  Social History   Social History  . Marital status: Widowed    Spouse name: N/A  . Number of children: N/A  . Years of education: N/A   Occupational History  . Not on file.   Social History Main Topics  . Smoking status: Former Smoker    Quit date: 03/08/1978  . Smokeless tobacco: Never Used  . Alcohol use Yes     Comment: occas  . Drug use: No  . Sexual activity: Not on file   Other Topics Concern  . Not on file   Social History Narrative   Widowed mother of one, grandmother of one. She quit smoking in 1980. She has a rare alcohol beverage from time to time. She currently lives at [place] on [address].   She is able to get around there without to much difficulty.   During previous visit, she voiced the desire for DO NOT RESUSCITATE and DO NOT INTUBATE. She also is with  the desire to avoid any invasive procedures.      PHYSICAL EXAM  Vitals:   12/11/15 1620  BP: 120/74  Pulse: 68  Resp: 18  Weight: 130 lb (59 kg)  Height: 5\' 2"  (1.575 m)    Body mass index is 23.78 kg/m.   General: The patient is well-developed and well-nourished and in no acute distress   Neurologic Exam  Mental status: The patient is alert and oriented x 3 at the time of the examination. The patient has apparent normal recent and remote memory, with an apparently normal attention span and concentration ability.   Speech is normal.  Cranial nerves: Extraocular movements are full.  Facial symmetry is present.  Facial strength is normal.  Trapezius and sternocleidomastoid strength is normal. No dysarthria is noted.    No obvious hearing deficits are noted.  Motor:  Muscle bulk is normal.   Tone is normal. There is no cogwheeling. Strength is  5 / 5 in the arms and 4+/5 in the legs..   Sensory: Sensory testing is intact to touch  in all 4 extremities.  Gait and station: Station is mildly wide stance.   Gait requires no support (though faster with walker) and stride is only mildly reduced (for age) .  She takes 3 steps to do 180 turn. She cannot tandem walk.   Romberg is negative.   Reflexes: Deep tendon reflexes are symmetric and 1+ bilaterally  knees.      DIAGNOSTIC DATA (LABS, IMAGING, TESTING) - I reviewed patient records, labs, notes, testing and imaging myself where available.  Lab Results  Component Value Date   WBC 10.5 10/16/2015   HGB 12.7 10/16/2015   HCT 37.5 10/16/2015   MCV 87.2 10/16/2015   PLT 465 (H) 10/16/2015      Component Value Date/Time   NA 132 (L) 10/16/2015 0506   K 4.5 10/16/2015 0506   CL 102 10/16/2015 0506   CO2 23 10/16/2015 0506   GLUCOSE 121 (H) 10/16/2015 0506   BUN 8 10/16/2015 0506   CREATININE 0.50 10/16/2015 0506   CALCIUM 8.8 (L) 10/16/2015 0506   PROT 5.9 (L) 10/05/2015 1853   ALBUMIN 2.8 (L) 10/05/2015 1853   AST 33  10/05/2015 1853   ALT 18 10/05/2015 1853   ALKPHOS 70 10/05/2015 1853   BILITOT 1.2 10/05/2015 1853   GFRNONAA >60 10/16/2015 0506   GFRAA >60 10/16/2015 0506       ASSESSMENT AND PLAN  Gait disorder  Parkinson's disease (HCC)  Tremor  Urge incontinence  Chronic constipation   1.   Continue Sinemet 25/100 up to tid  2.   Increase trazodone to 50 mg po qHS and increase Miralax to nightly.   3.    RTC 6 months, sooner if problems   Richard A. Felecia Shelling, MD, PhD [id number], [id number] PM Certified in Neurology, Clinical Neurophysiology, Sleep Medicine, Pain Medicine and Neuroimaging  [facility] [address] [place], [place]

## 2015-12-29 ENCOUNTER — Telehealth: Payer: Self-pay | Admitting: Neurology

## 2015-12-29 NOTE — Telephone Encounter (Signed)
I have spoken with pt's dtr. this afternoon, and explained pt. can use lozenges, Biotene.  She is already doing this; will continue/fim

## 2015-12-29 NOTE — Telephone Encounter (Signed)
Patient's daughter is calling. Is there something the patient can take for a metallic taste in her mouth? Please call and advise.

## 2016-03-19 ENCOUNTER — Other Ambulatory Visit: Payer: Self-pay

## 2016-03-19 ENCOUNTER — Other Ambulatory Visit: Payer: Self-pay | Admitting: Cardiology

## 2016-03-19 MED ORDER — EZETIMIBE-SIMVASTATIN 10-40 MG PO TABS: 1.0000 | ORAL_TABLET | Freq: Every day | ORAL | 2 refills | Status: DC

## 2016-03-19 NOTE — Telephone Encounter (Signed)
Rx(s) sent to pharmacy electronically.  

## 2016-03-22 ENCOUNTER — Ambulatory Visit: Payer: Medicare Other | Admitting: Nurse Practitioner

## 2016-06-09 ENCOUNTER — Ambulatory Visit (INDEPENDENT_AMBULATORY_CARE_PROVIDER_SITE_OTHER): Payer: Medicare Other | Admitting: Neurology

## 2016-06-09 ENCOUNTER — Encounter: Payer: Self-pay | Admitting: Neurology

## 2016-06-09 VITALS — BP 163/68 | HR 69 | Resp 16 | Ht 62.0 in | Wt 127.5 lb

## 2016-06-09 DIAGNOSIS — R269 Unspecified abnormalities of gait and mobility: Secondary | ICD-10-CM

## 2016-06-09 DIAGNOSIS — G20 Parkinson's disease: Secondary | ICD-10-CM

## 2016-06-09 DIAGNOSIS — G4700 Insomnia, unspecified: Secondary | ICD-10-CM

## 2016-06-09 MED ORDER — CARBIDOPA-LEVODOPA ER 25-100 MG PO TBCR: EXTENDED_RELEASE_TABLET | ORAL | 3 refills | Status: DC

## 2016-06-09 MED ORDER — TRAZODONE HCL 50 MG PO TABS: 50.0000 mg | ORAL_TABLET | Freq: Every day | ORAL | 3 refills | Status: DC

## 2016-06-09 MED ORDER — CARBIDOPA-LEVODOPA ER 25-100 MG PO TBCR: EXTENDED_RELEASE_TABLET | ORAL | 11 refills | Status: DC

## 2016-06-09 NOTE — Progress Notes (Signed)
[facility]  PATIENT: Stacey Walker DOB: 1925-08-21  REFERRING DOCTOR OR PCP:  Leanna Battles SOURCE: patient, daughter, EMR records, CT images in PACS  _________________________________   HISTORICAL  CHIEF COMPLAINT:  Chief Complaint  Patient presents with  . Parkinson's Disease    Sts. tremor is about the same.  Sts. she "gets plenty of sleep", 6-7 hrs. per night with Trazodone.  Generally goes to bed early, gets up early, naps some during the da.  "I'm never tired." Sts. constipation is the same.--she continues daily Miralax, other prn meds/fim   HISTORY OF PRESENT ILLNESS:  Stacey Walker is an 83 year old right-handed woman with tremor and gait disturbance diagnosed with Parkinson's disease.     She presented with a poor gait and reduced ability to walk in 2016 following a hospital stay for aspiration pneumonia.. She improved after a few months but then worsened again there was also a mild tremor. Sinemet was started and she had significant improvement in her gait and tremor. She is currently on Sinemet 25/100 3 times a day. She tolerates it well though feels her constipation worsened on it.   Currently, she uses a walker and can walk at least 100 feet without rest.     She notes her hands are numb and her handwriting is worse.  The numbness is in her fingertips.      She has joint pain in both hands.      Cognition has been stable. The daughter needs to help her with bills but she also has poor eyesight and poor handwriting.   She has had mild forgetfulness at times  She has some urinary urgency and rare incontinence as she enters the bathroom.    She has insomnia many nights, helped by trazodone.  She has carotid arterial stenosis, with with 60-70% stenosis on the right and 70% on the left.  She also has about 50% bilateral subclavian stenosis.  REVIEW OF SYSTEMS: Constitutional: No fevers, chills, sweats, or change in appetite.  She notes fatigue and is  sometimes sleepy. Eyes: Poor vision due to macular degeneration. Ear, nose and throat: No hearing loss, ear pain, nasal congestion, sore throat Cardiovascular: No chest pain, palpitations.   She has carotid artery stenosis. Respiratory: No shortness of breath at rest or with exertion.   No wheezes GastrointestinaI: No nausea, vomiting, diarrhea, abdominal pain, fecal incontinence.  She has noticed some difficulty swallowing. She has occasional constipation. Genitourinary: No dysuria, urinary retention or frequency.  No nocturia. Musculoskeletal: No neck pain, back pain.  Has some hip tenderness and had hip surgery in 2009. Integumentary: No rash.  Notes some moles and itching. Neurological: as above Psychiatric: No depression at this time.  No anxiety Endocrine: No palpitations, diaphoresis, change in appetite, change in weigh or increased thirst Hematologic/Lymphatic: No anemia, purpura, petechiae. Allergic/Immunologic: No itchy/runny eyes, rashes.  Sometimes has runny nose.  ALLERGIES: No Known Allergies  HOME MEDICATIONS:  Current Outpatient Prescriptions:  .  acetaminophen (TYLENOL) 325 MG tablet, Take 650 mg by mouth every 6 (six) hours as needed for moderate pain., Disp: , Rfl:  .  aspirin EC 81 MG tablet, Take 81 mg by mouth daily., Disp: , Rfl:  .  bisacodyl (DULCOLAX) 10 MG suppository, Place 1 suppository (10 mg total) rectally daily., Disp: 12 suppository, Rfl: 0 .  docusate sodium (COLACE) 100 MG capsule, Take 100 mg by mouth at bedtime., Disp: , Rfl:  .  ezetimibe-simvastatin (VYTORIN) 10-40 MG tablet, Take 1 tablet  by mouth daily., Disp: 30 tablet, Rfl: 2 .  lactulose (CEPHULAC) 10 g packet, Take 10 g by mouth 3 (three) times daily., Disp: , Rfl:  .  Multiple Vitamin (MULTIVITAMIN WITH MINERALS) TABS tablet, Take 1 tablet by mouth daily., Disp: , Rfl:  .  omega-3 acid ethyl esters (LOVAZA) 1 g capsule, Take 1 g by mouth daily., Disp: , Rfl:  .  polyethylene glycol  (MIRALAX / GLYCOLAX) packet, Take 17 g by mouth 2 (two) times daily., Disp: 14 each, Rfl: 0 .  timolol (BETIMOL) 0.5 % ophthalmic solution, Place 1 drop into both eyes daily. , Disp: , Rfl:  .  traZODone (DESYREL) 50 MG tablet, Take 1 tablet (50 mg total) by mouth at bedtime., Disp: 90 tablet, Rfl: 3 .  valsartan (DIOVAN) 320 MG tablet, Take 320 mg by mouth daily., Disp: , Rfl:  .  valsartan-hydrochlorothiazide (DIOVAN-HCT) 320-12.5 MG per tablet, Take 1 tablet by mouth daily., Disp: , Rfl:  .  Carbidopa-Levodopa ER (SINEMET CR) 25-100 MG tablet controlled release, One po q AM and two po qHS, Disp: 270 tablet, Rfl: 3 .  ipratropium (ATROVENT) 0.03 % nasal spray, Place 1 spray into both nostrils 3 (three) times daily. , Disp: , Rfl:  .  LUMIGAN 0.01 % SOLN, Place 1 drop into both eyes at bedtime., Disp: , Rfl:   PAST MEDICAL HISTORY: Past Medical History:  Diagnosis Date  . Asymptomatic carotid artery stenosis    Dopplers January 2016: RICA 50-69% tortuous (no change; L ICA ~70%, tortuous (no change), bilateral subclavian arteries <50%  . Bladder cancer ([place])   . Cervical cancer ([place])   . Coronary artery disease   . Dyslipidemia    Managed by PCP. On statin  . Glaucoma   . Hypertension   . Hypertension, essential, benign   . Moderate Right-sided carotid artery disease    50-70% stenosis on the right carotid artery from 2011. Also noted less than 50% right and left subclavian artery stenosis.  . Parkinson's disease ([place])     PAST SURGICAL HISTORY: Past Surgical History:  Procedure Laterality Date  . ABDOMINAL HYSTERECTOMY    . BLADDER TUMOR EXCISION    . Carotid Dopplers  July 2011   50-70% stenosis of the right carotid with <50% stenosis of both the right and left subclavian arteries.  . CHOLECYSTECTOMY    . NM MYOVIEW LTD  August 2007   No ischemia or infarction  . TRANSTHORACIC ECHOCARDIOGRAM  August 7   Normal EF, impaired relaxation with mildly sclerotic aortic valve but no  stenosis. Mild left atrial dilation.    FAMILY HISTORY: Family History  Problem Relation Age of Onset  . Dementia Mother   . Lung cancer Father     SOCIAL HISTORY:  Social History   Social History  . Marital status: Widowed    Spouse name: N/A  . Number of children: N/A  . Years of education: N/A   Occupational History  . Not on file.   Social History Main Topics  . Smoking status: Former Smoker    Quit date: 03/08/1978  . Smokeless tobacco: Never Used  . Alcohol use Yes     Comment: occas  . Drug use: No  . Sexual activity: Not on file   Other Topics Concern  . Not on file   Social History Narrative   Widowed mother of one, grandmother of one. She quit smoking in 1980. She has a rare alcohol beverage from time to time. She  currently lives at [place] on [address].   She is able to get around there without to much difficulty.   During previous visit, she voiced the desire for DO NOT RESUSCITATE and DO NOT INTUBATE. She also is with the desire to avoid any invasive procedures.      PHYSICAL EXAM  Vitals:   06/09/16 1611  BP: (!) 163/68  Pulse: 69  Resp: 16  Weight: 127 lb 8 oz (57.8 kg)  Height: 5\' 2"  (1.575 m)    Body mass index is 23.32 kg/m.   General: The patient is well-developed and well-nourished and in no acute distress   Neurologic Exam  Mental status: The patient is alert and oriented x 3 at the time of the examination.     Speech is normal.  Cranial nerves: Extraocular movements are full.  Facial symmetry is present.  Facial strength is normal.  Trapezius and sternocleidomastoid strength is normal. No dysarthria is noted.    No obvious hearing deficits are noted.  Motor:  Muscle bulk is normal.   Tone is normal. There is no cogwheeling. Strength is  5 / 5 in the arms and 4+/5 in the legs..   Sensory: Sensory testing is intact to touch  in all 4 extremities.  Gait and station: Station is mildly wide stance.   She can walk  without support but feels much more stable when she also want to a walker or my hands. The stride is normal for age but she takes 5 steps to do 180 turn. She cannot tandem walk.   Romberg is negative.   Reflexes: Deep tendon reflexes are symmetric and 1+ bilaterally  knees.      DIAGNOSTIC DATA (LABS, IMAGING, TESTING) - I reviewed patient records, labs, notes, testing and imaging myself where available.  Lab Results  Component Value Date   WBC 10.5 10/16/2015   HGB 12.7 10/16/2015   HCT 37.5 10/16/2015   MCV 87.2 10/16/2015   PLT 465 (H) 10/16/2015      Component Value Date/Time   NA 132 (L) 10/16/2015 0506   K 4.5 10/16/2015 0506   CL 102 10/16/2015 0506   CO2 23 10/16/2015 0506   GLUCOSE 121 (H) 10/16/2015 0506   BUN 8 10/16/2015 0506   CREATININE 0.50 10/16/2015 0506   CALCIUM 8.8 (L) 10/16/2015 0506   PROT 5.9 (L) 10/05/2015 1853   ALBUMIN 2.8 (L) 10/05/2015 1853   AST 33 10/05/2015 1853   ALT 18 10/05/2015 1853   ALKPHOS 70 10/05/2015 1853   BILITOT 1.2 10/05/2015 1853   GFRNONAA >60 10/16/2015 0506   GFRAA >60 10/16/2015 0506       ASSESSMENT AND PLAN  Parkinson's disease ([place])  Gait disorder  Insomnia, unspecified type   1.   Change Sinemet to the CR formulation 25/100 qAM and 2 x 25/100 qHS  2.   Continue trazodone to 50 mg po qHS .   3.    RTC 12 months, sooner if problems   Nirvaan Frett A. Felecia Shelling, MD, PhD 08/14/6293, 2:84 PM Certified in Neurology, Clinical Neurophysiology, Sleep Medicine, Pain Medicine and Neuroimaging  [facility] [address], [place], [place]

## 2016-08-14 IMAGING — CT CT CHEST W/O CM
2 of 4 series · 15 of 36 positions shown, 18 images · non-contrast
Comparison: Radiographs 08/15/2015.  CT 06/30/2014 and 01/02/2008.

CLINICAL DATA: Evaluate lung nodule. History of bladder cancer and
smoking.

EXAM:
CT CHEST WITHOUT CONTRAST
TECHNIQUE: Multidetector CT imaging of the chest was performed following the
standard protocol without IV contrast.

[Series 2: chest w/o st · axial · non-contrast · 0.67mm/px · z∈[-252,-8]mm · 12 of 146 slices shown, 15 images]
[im 12/146  mediastinal]
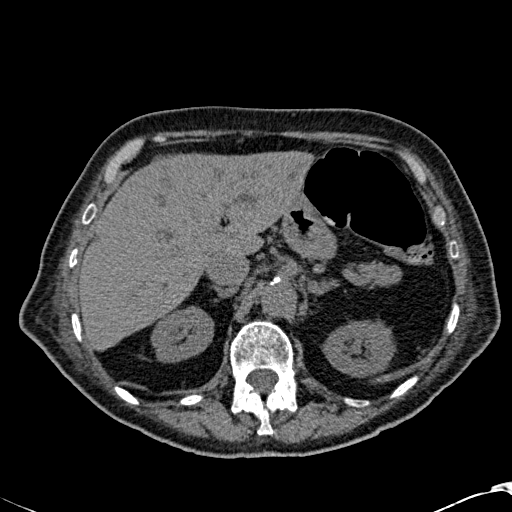
[im 12/146  lung]
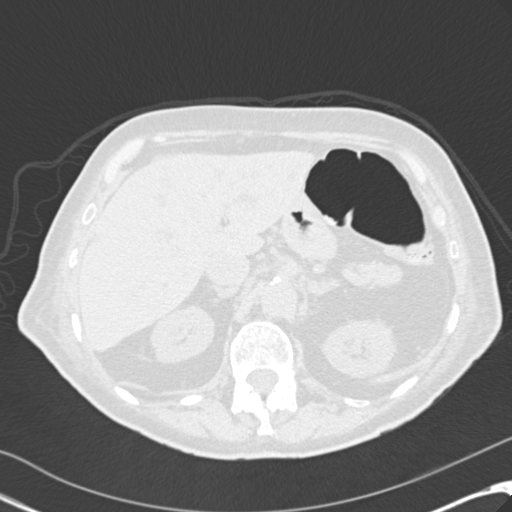
[im 23/146  lung]
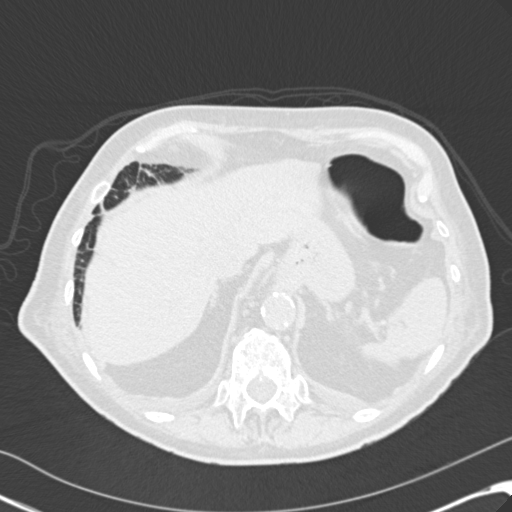
[im 34/146  lung]
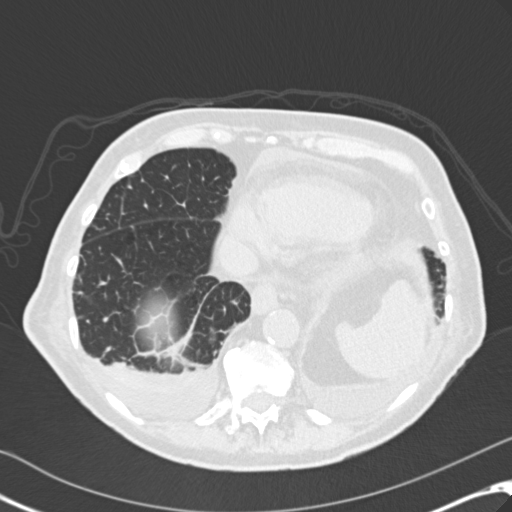
[im 45/146  lung]
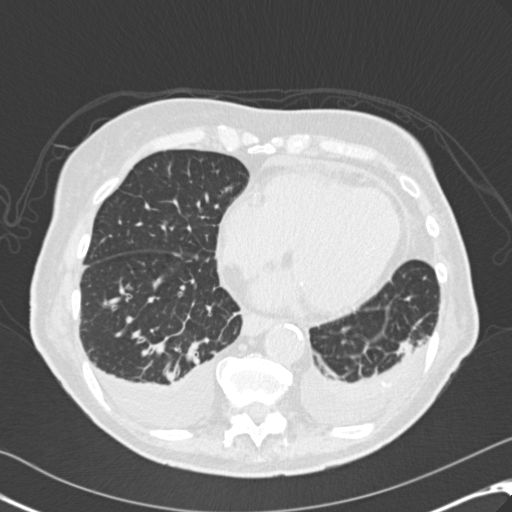
[im 56/146  mediastinal]
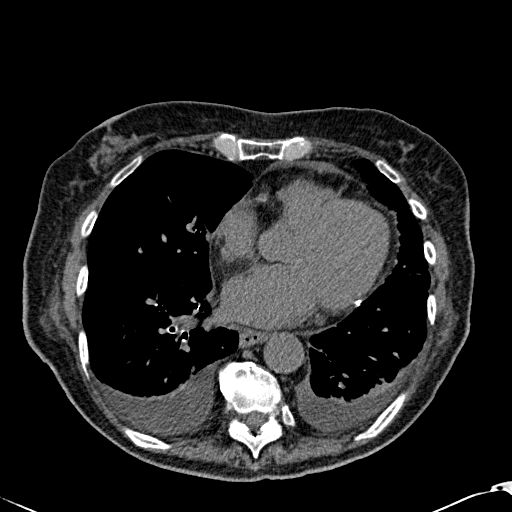
[im 56/146  lung]
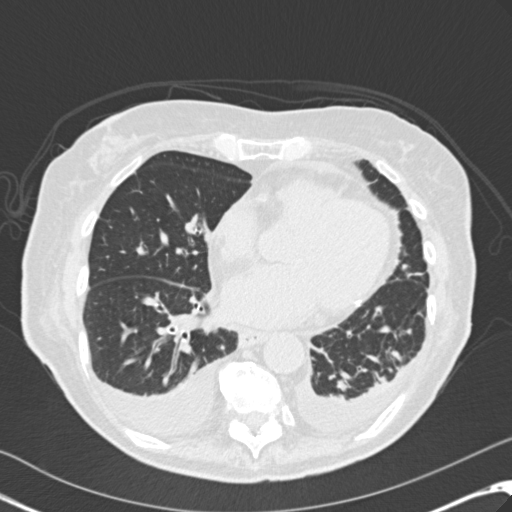
[im 67/146  lung]
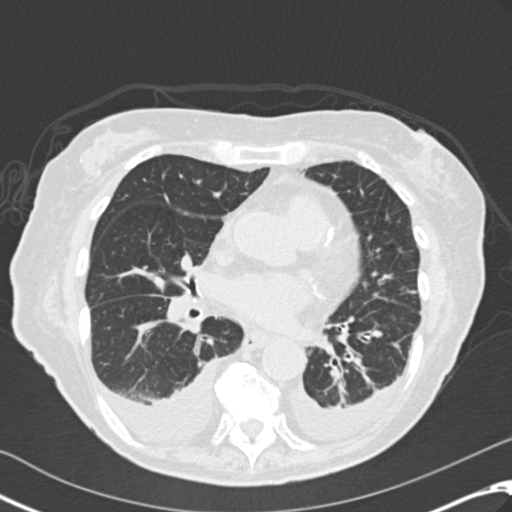
[im 79/146  lung]
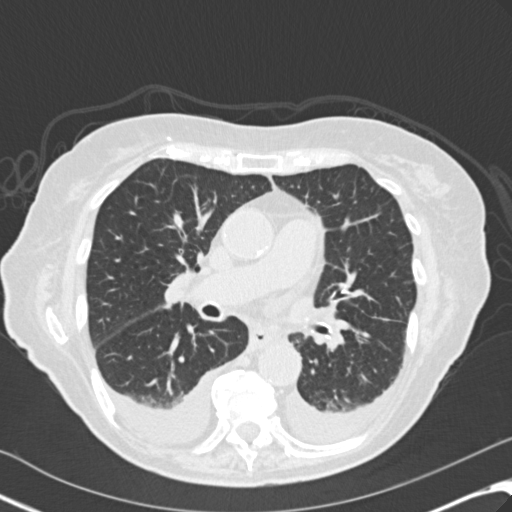
[im 90/146  lung]
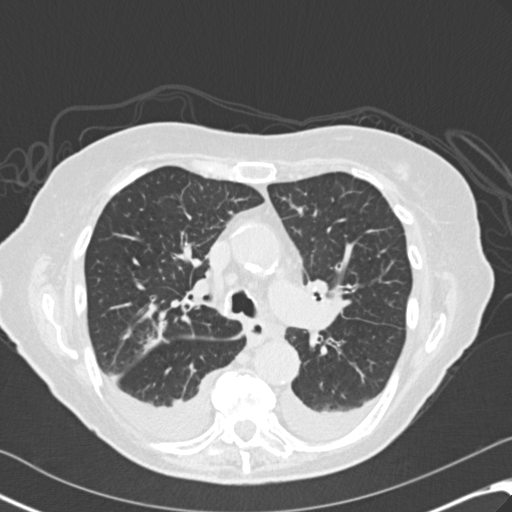
[im 101/146  mediastinal]
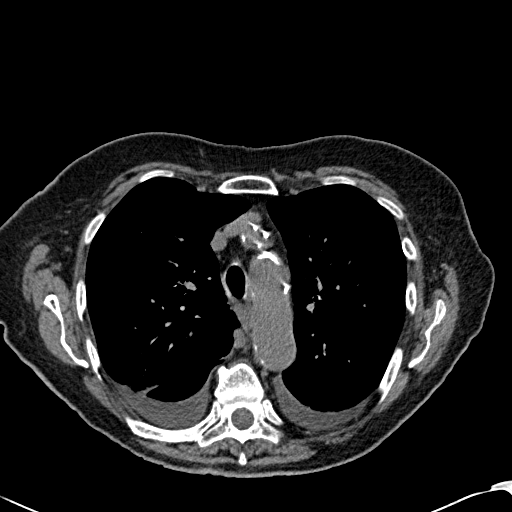
[im 101/146  lung]
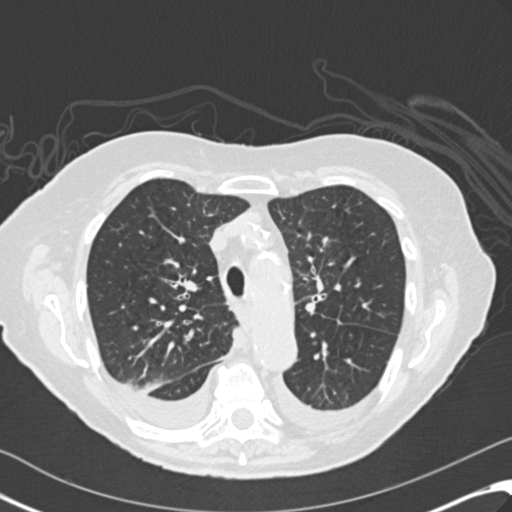
[im 112/146  lung]
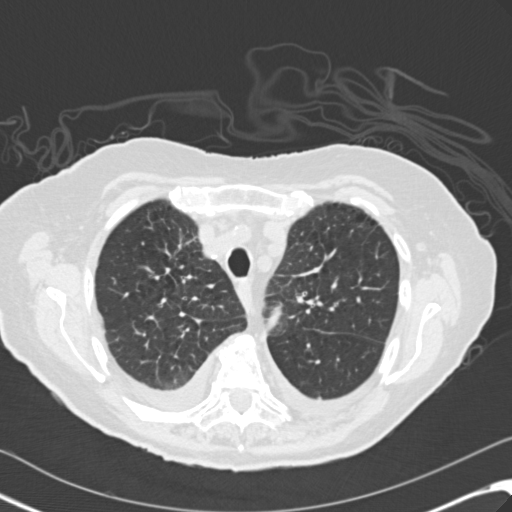
[im 123/146  lung]
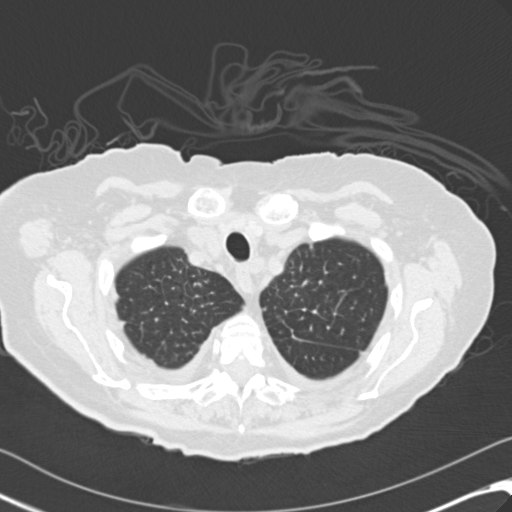
[im 134/146  lung]
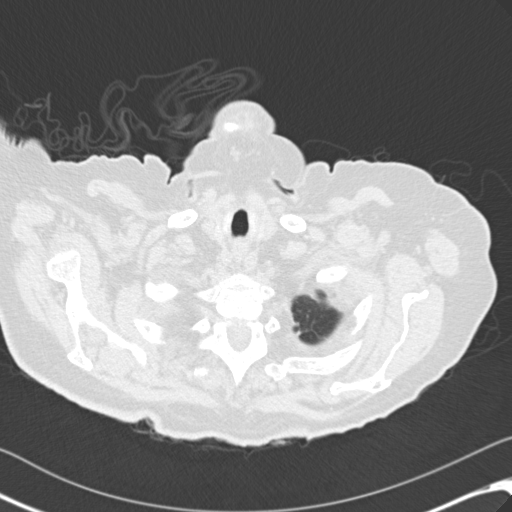

[Series 602: <mpr thick range> · coronal · 0.67mm/px · 3 of 128 slices shown]
[im 26/128  lung]
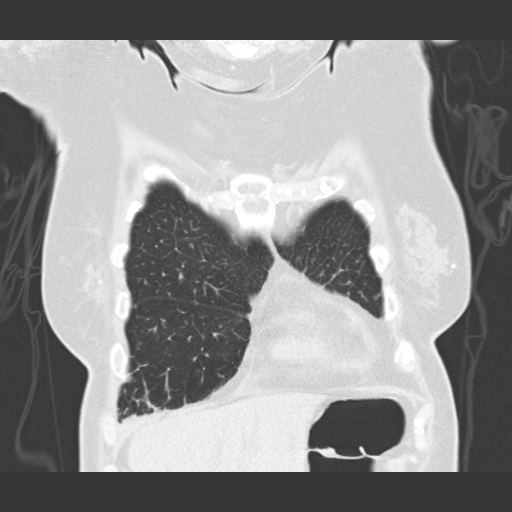
[im 51/128  lung]
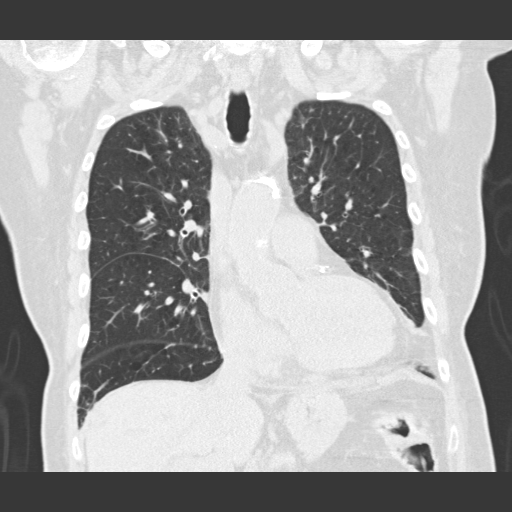
[im 77/128  lung]
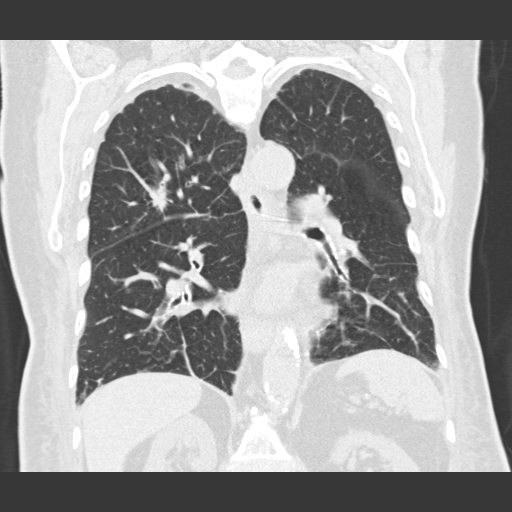

[15 of 36 positions shown; findings below may reference images not displayed]

FINDINGS: Cardiovascular: Again demonstrated is diffuse atherosclerosis of the
aorta, great vessels and coronary arteries. No acute vascular
findings are demonstrated on noncontrast imaging. The heart size is
normal. There is small amount of pericardial fluid versus
thickening.

Mediastinum/Nodes: There are no enlarged mediastinal, hilar or
axillary lymph nodes.Hilar assessment is limited by the lack of
intravenous contrast, although the hilar contours appear unchanged.
The thyroid gland, trachea and esophagus demonstrate no significant
findings.

Lungs/Pleura: There are small dependent pleural effusions
bilaterally. Mild nodularity is noted along the nondependent aspect
of the right mainstem bronchus (image 60), possibly adherent
secretion. Mild emphysema and biapical scarring are noted. There is
mild dependent atelectasis at both lung bases. There is no focal or
concerning opacity at the left lung base. There is punctate high
density within the left lower lobe parenchyma which may be secondary
to aspiration. The right upper lobe nodule demonstrated on the more
recent prior CT is persistent and appears slightly larger and more
solid. This measures 2.1 x 1.1 cm on image 55 and is worrisome for
malignancy. No other suspicious pulmonary nodules.

Upper abdomen: No suspicious findings are seen within the visualized
upper abdomen. There is mild biliary dilatation status post
cholecystectomy, grossly stable. Aortic and branch vessel
atherosclerosis noted.

Musculoskeletal/Chest wall: There is no chest wall mass or
suspicious osseous finding. Mild thoracic spine degenerative changes
are present.
IMPRESSION: 1. The previously demonstrated right upper lobe nodule has enlarged
and become more solid since the prior CT of 16 months ago. This
finding is concerning for a slow growing malignancy, likely
adenocarcinoma. Metastatic disease from bladder cancer less likely.
Tissue sampling and/or PET-CT recommended for further evaluation.
2. New bilateral pleural effusions with associated mild bibasilar
atelectasis. No focal left lower lobe airspace disease.
3. No evidence of metastatic disease on noncontrast imaging.
4. Diffuse atherosclerosis, including aortic atherosclerosis.

## 2016-09-21 ENCOUNTER — Telehealth: Payer: Self-pay | Admitting: Neurology

## 2016-09-21 MED ORDER — CARBIDOPA-LEVODOPA 25-100 MG PO TABS: 1.0000 | ORAL_TABLET | Freq: Four times a day (QID) | ORAL | 5 refills | Status: DC

## 2016-09-21 NOTE — Telephone Encounter (Signed)
I have spoken with [name] this afternoon. She reports pt. is having more difficulty walking, more numbness in her fingers since switching from reg. Sinemet to Sinemet CR.  Per RAS, ok to stop Sinemet CR and go back to Sinemet 25/100mg , but will increase this from the old dose of tid, to qid.  Rx. escribed to [facility] per [name]'s request/fim

## 2016-09-21 NOTE — Addendum Note (Signed)
Addended by: France Ravens I on: 09/21/2016 05:00 PM   Modules accepted: Orders

## 2016-09-21 NOTE — Telephone Encounter (Signed)
Patients daughter [name] called office in reference to patient having finger tip numbness bilateral hands for about a month.  Daughter also states patients walking has decreased to slower and not as steady.  Please call

## 2016-10-12 ENCOUNTER — Ambulatory Visit (INDEPENDENT_AMBULATORY_CARE_PROVIDER_SITE_OTHER): Payer: Medicare Other | Admitting: Sports Medicine

## 2016-10-12 DIAGNOSIS — M79675 Pain in left toe(s): Secondary | ICD-10-CM

## 2016-10-12 DIAGNOSIS — L539 Erythematous condition, unspecified: Secondary | ICD-10-CM | POA: Diagnosis not present

## 2016-10-12 MED ORDER — AMOXICILLIN-POT CLAVULANATE 875-125 MG PO TABS: 1.0000 | ORAL_TABLET | Freq: Two times a day (BID) | ORAL | 0 refills | Status: DC

## 2016-10-12 NOTE — Patient Instructions (Signed)

## 2016-10-12 NOTE — Progress Notes (Signed)
Subjective: Stacey Walker is a 83 y.o. female patient seen today in office with complaint of painful red left 1st toenail after nail trim last week with soreness. Patient denies history of Diabetes, Neuropathy, or Vascular disease.  + parkinson hx. Patient has no other pedal complaints at this time.   Patient is assisted by daughter this visit.   Patient Active Problem List   Diagnosis Date Noted  . Insomnia 06/09/2016  . Parkinson's disease ([place]) 12/11/2015  . Chronic constipation 12/11/2015  . Malnutrition of moderate degree 10/15/2015  . Acute hypokalemia 10/13/2015  . UTI (lower urinary tract infection) 10/13/2015  . Generalized weakness 10/13/2015  . Urge incontinence 09/08/2015  . Tremor 09/08/2015  . Gait disorder 07/18/2014  . CAP (community acquired pneumonia) 06/30/2014  . Lung nodule 06/30/2014  . Elevated troponin 06/30/2014  . Pneumonia 06/30/2014  . Hypertension, essential, benign   . Dyslipidemia   . Bilateral carotid artery disease [facility])     Current Outpatient Prescriptions on File Prior to Visit  Medication Sig Dispense Refill  . acetaminophen (TYLENOL) 325 MG tablet Take 650 mg by mouth every 6 (six) hours as needed for moderate pain.    Marland Kitchen aspirin EC 81 MG tablet Take 81 mg by mouth daily.    . bisacodyl (DULCOLAX) 10 MG suppository Place 1 suppository (10 mg total) rectally daily. 12 suppository 0  . carbidopa-levodopa (SINEMET IR) 25-100 MG tablet Take 1 tablet by mouth 4 (four) times daily. 120 tablet 5  . docusate sodium (COLACE) 100 MG capsule Take 100 mg by mouth at bedtime.    Marland Kitchen ezetimibe-simvastatin (VYTORIN) 10-40 MG tablet Take 1 tablet by mouth daily. 30 tablet 2  . ipratropium (ATROVENT) 0.03 % nasal spray Place 1 spray into both nostrils 3 (three) times daily.     Marland Kitchen lactulose (CEPHULAC) 10 g packet Take 10 g by mouth 3 (three) times daily.    Marland Kitchen LUMIGAN 0.01 % SOLN Place 1 drop into both eyes at bedtime.    . Multiple Vitamin (MULTIVITAMIN WITH  MINERALS) TABS tablet Take 1 tablet by mouth daily.    Marland Kitchen omega-3 acid ethyl esters (LOVAZA) 1 g capsule Take 1 g by mouth daily.    . polyethylene glycol (MIRALAX / GLYCOLAX) packet Take 17 g by mouth 2 (two) times daily. 14 each 0  . timolol (BETIMOL) 0.5 % ophthalmic solution Place 1 drop into both eyes daily.     . traZODone (DESYREL) 50 MG tablet Take 1 tablet (50 mg total) by mouth at bedtime. 90 tablet 3  . valsartan (DIOVAN) 320 MG tablet Take 320 mg by mouth daily.    . valsartan-hydrochlorothiazide (DIOVAN-HCT) 320-12.5 MG per tablet Take 1 tablet by mouth daily.     No current facility-administered medications on file prior to visit.     No Known Allergies  Objective: Physical Exam  General: Well developed, nourished, no acute distress, awake, alert and oriented x 3  Vascular: Dorsalis pedis artery 1/4 bilateral, Posterior tibial artery 1/4 bilateral, skin temperature warm to warm proximal to distal bilateral lower extremities, + varicosities, scant pedal hair present bilateral.  Neurological: Gross sensation present via light touch bilateral.   Dermatological: Skin is warm, dry, and supple bilateral, focal erythema to lateral nail fold on left hallux with small abrasion, no webspace macerations present bilateral, no open lesions present bilateral, no callus/corns/hyperkeratotic tissue present bilateral. No signs of infection bilateral.  Musculoskeletal: +pain to left 1st toe at lateral nail fold. No pain with  calf compression bilateral.  Assessment and Plan:  Problem List Items Addressed This Visit    None    Visit Diagnoses    Toe pain, left    -  Primary   Erythema of toe          -Examined patient.  -Discussed treatment options for likely infected abrasion with erythema to toe -Rx Augmentin to take as instructed and recommended epsom soaks if no better in 2 weeks would recommend debridement of nail or I&D of nail fold.  -Recommend protection, ice, elevation and  avoid things that will rub toe -Patient to return in 2 weeks for follow up evaluation or sooner if symptoms worsen.  Landis Martins, DPM

## 2016-10-26 ENCOUNTER — Ambulatory Visit (INDEPENDENT_AMBULATORY_CARE_PROVIDER_SITE_OTHER): Payer: Medicare Other | Admitting: Sports Medicine

## 2016-10-26 DIAGNOSIS — L539 Erythematous condition, unspecified: Secondary | ICD-10-CM

## 2016-10-26 DIAGNOSIS — M79675 Pain in left toe(s): Secondary | ICD-10-CM | POA: Diagnosis not present

## 2016-10-26 NOTE — Progress Notes (Signed)
Subjective: Stacey Walker is a 83 y.o. female patient seen today in office for nail check at left 1st toe. Reports that the redness and swelling is better. States that there is still a little soreness around nail fold. Has been soaking and taking antibiotics by mouth with improvement. Patient has no other pedal complaints at this time.   Patient is assisted by daughter this visit.   Patient Active Problem List   Diagnosis Date Noted  . Insomnia 06/09/2016  . Parkinson's disease ([place]) 12/11/2015  . Chronic constipation 12/11/2015  . Malnutrition of moderate degree 10/15/2015  . Acute hypokalemia 10/13/2015  . UTI (lower urinary tract infection) 10/13/2015  . Generalized weakness 10/13/2015  . Urge incontinence 09/08/2015  . Tremor 09/08/2015  . Gait disorder 07/18/2014  . CAP (community acquired pneumonia) 06/30/2014  . Lung nodule 06/30/2014  . Elevated troponin 06/30/2014  . Pneumonia 06/30/2014  . Hypertension, essential, benign   . Dyslipidemia   . Bilateral carotid artery disease [facility])     Current Outpatient Prescriptions on File Prior to Visit  Medication Sig Dispense Refill  . acetaminophen (TYLENOL) 325 MG tablet Take 650 mg by mouth every 6 (six) hours as needed for moderate pain.    Marland Kitchen amoxicillin-clavulanate (AUGMENTIN) 875-125 MG tablet Take 1 tablet by mouth 2 (two) times daily. 28 tablet 0  . aspirin EC 81 MG tablet Take 81 mg by mouth daily.    . bisacodyl (DULCOLAX) 10 MG suppository Place 1 suppository (10 mg total) rectally daily. 12 suppository 0  . carbidopa-levodopa (SINEMET IR) 25-100 MG tablet Take 1 tablet by mouth 4 (four) times daily. 120 tablet 5  . docusate sodium (COLACE) 100 MG capsule Take 100 mg by mouth at bedtime.    Marland Kitchen ezetimibe-simvastatin (VYTORIN) 10-40 MG tablet Take 1 tablet by mouth daily. 30 tablet 2  . ipratropium (ATROVENT) 0.03 % nasal spray Place 1 spray into both nostrils 3 (three) times daily.     Marland Kitchen lactulose (CEPHULAC) 10 g packet Take  10 g by mouth 3 (three) times daily.    Marland Kitchen LUMIGAN 0.01 % SOLN Place 1 drop into both eyes at bedtime.    . Multiple Vitamin (MULTIVITAMIN WITH MINERALS) TABS tablet Take 1 tablet by mouth daily.    Marland Kitchen omega-3 acid ethyl esters (LOVAZA) 1 g capsule Take 1 g by mouth daily.    . polyethylene glycol (MIRALAX / GLYCOLAX) packet Take 17 g by mouth 2 (two) times daily. 14 each 0  . timolol (BETIMOL) 0.5 % ophthalmic solution Place 1 drop into both eyes daily.     . traZODone (DESYREL) 50 MG tablet Take 1 tablet (50 mg total) by mouth at bedtime. 90 tablet 3  . valsartan (DIOVAN) 320 MG tablet Take 320 mg by mouth daily.    . valsartan-hydrochlorothiazide (DIOVAN-HCT) 320-12.5 MG per tablet Take 1 tablet by mouth daily.     No current facility-administered medications on file prior to visit.     No Known Allergies  Objective: Physical Exam  General: Well developed, nourished, no acute distress, awake, alert and oriented x 3  Vascular: Dorsalis pedis artery 1/4 bilateral, Posterior tibial artery 1/4 bilateral, skin temperature warm to warm proximal to distal bilateral lower extremities, + varicosities, scant pedal hair present bilateral.  Neurological: Gross sensation present via light touch bilateral.   Dermatological: Skin is warm, dry, and supple bilateral, resolved erythema to lateral nail fold on left hallux with small abrasion that is healed, mild incurvation of left  hallux nail, no webspace macerations present bilateral, no open lesions present bilateral, no callus/corns/hyperkeratotic tissue present bilateral. No signs of infection bilateral.  Musculoskeletal: +mild pain to left 1st toe at lateral nail fold. No pain with calf compression bilateral.  Assessment and Plan:  Problem List Items Addressed This Visit    None    Visit Diagnoses    Toe pain, left    -  Primary   Erythema of toe         -Examined patient.  -Discussed continued care for resolved infected abrasion with  erythema to toe with nail incurvation -Trimmed left 1st toenail with nail nipper without incident  -Continue with Augmentin until completed -May discontinue soaking however after shower to continue with antibiotic cream to nail fold x 1 week and then after no dressings or creams are needed  -Patient to return as needed or sooner if symptoms worsen.  Landis Martins, DPM

## 2017-03-07 ENCOUNTER — Other Ambulatory Visit: Payer: Self-pay | Admitting: Neurology

## 2017-03-23 ENCOUNTER — Other Ambulatory Visit: Payer: Self-pay | Admitting: Cardiology

## 2017-03-23 NOTE — Telephone Encounter (Signed)
REFILL 

## 2017-04-07 ENCOUNTER — Other Ambulatory Visit: Payer: Self-pay | Admitting: Neurology

## 2017-06-29 ENCOUNTER — Other Ambulatory Visit: Payer: Self-pay | Admitting: Neurology

## 2017-08-13 IMAGING — US US RENAL
1 series · 14 of 25 positions shown · non-contrast
Comparison: 07/20/2004 renal ultrasound.

CLINICAL DATA: Complicating urinary tract infection.

EXAM:
RENAL / URINARY TRACT ULTRASOUND COMPLETE

[Series 1: us renal · 0.23mm/px · 14 of 42 slices shown]
[im 1/42]
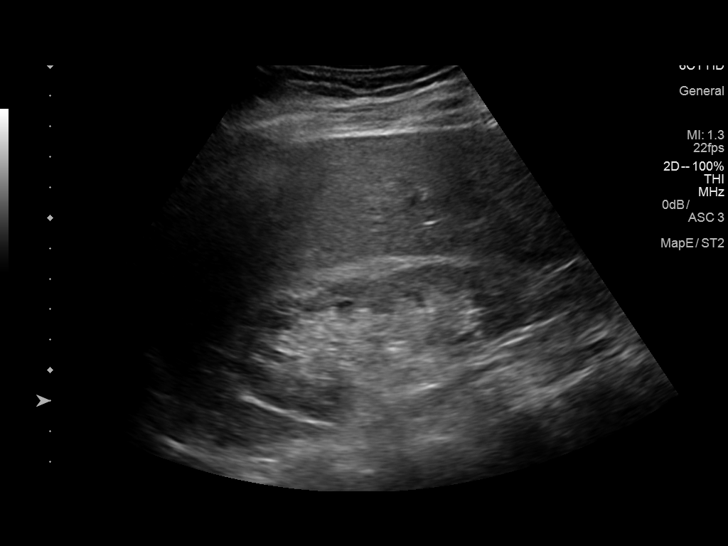
[im 4/42]
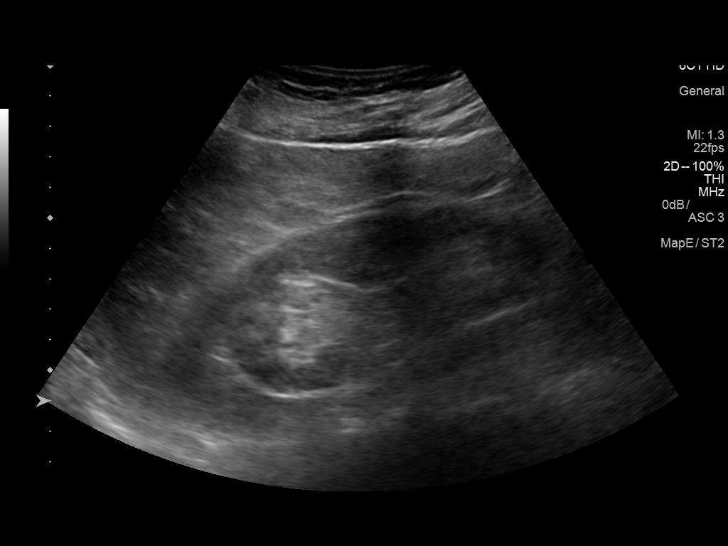
[im 7/42]
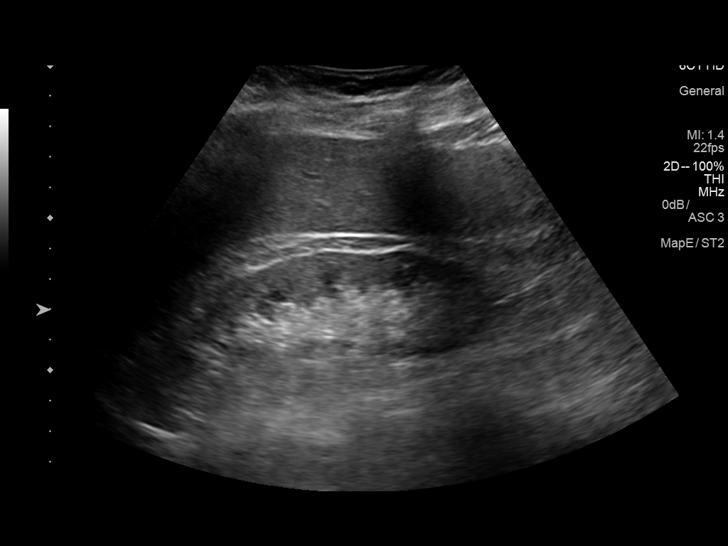
[im 11/42]
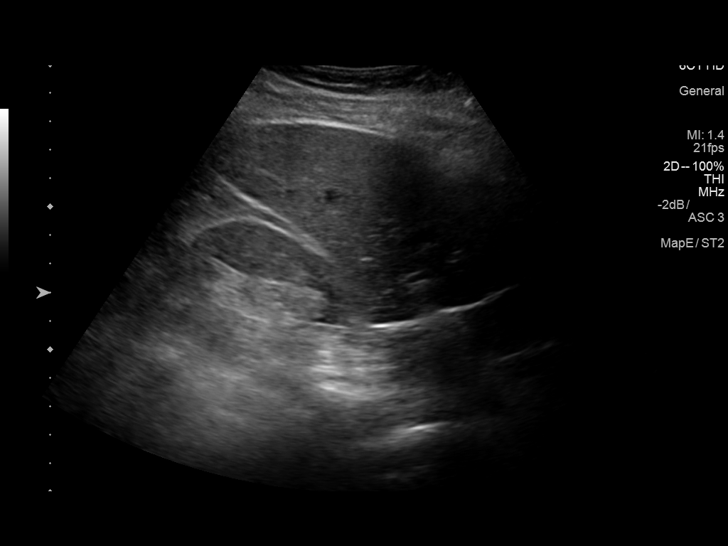
[im 14/42]
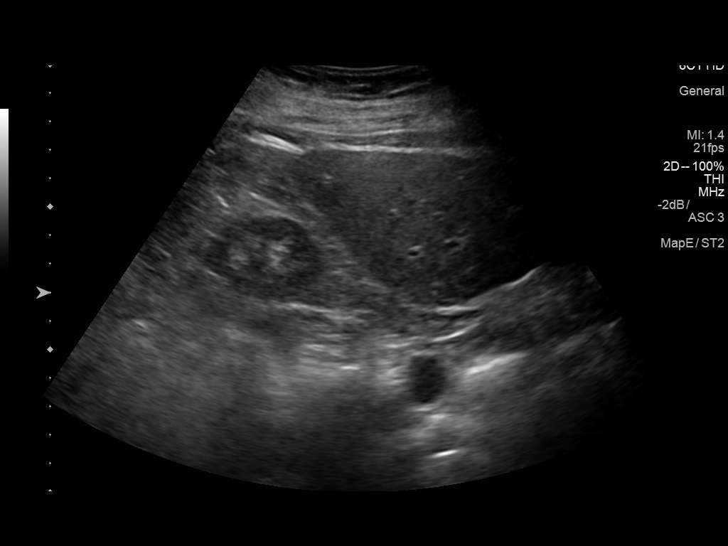
[im 16/42]
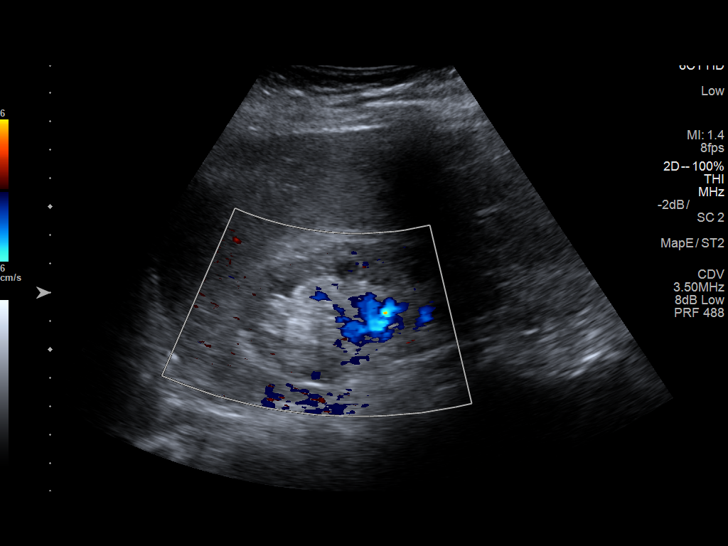
[im 19/42]
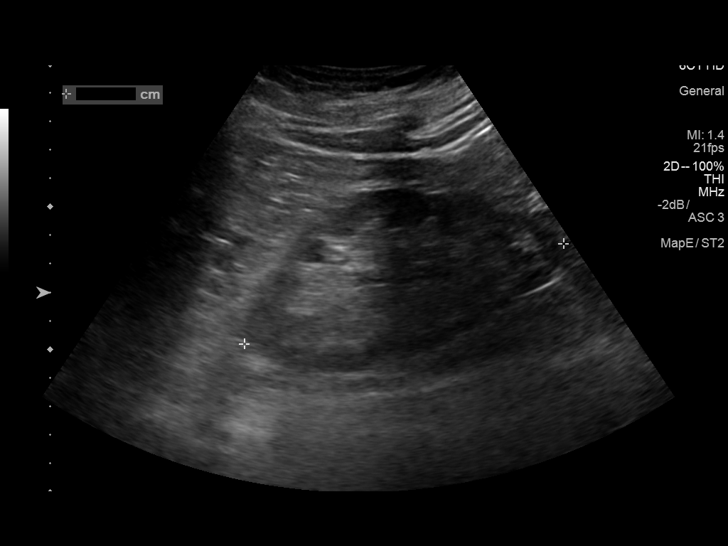
[im 23/42]
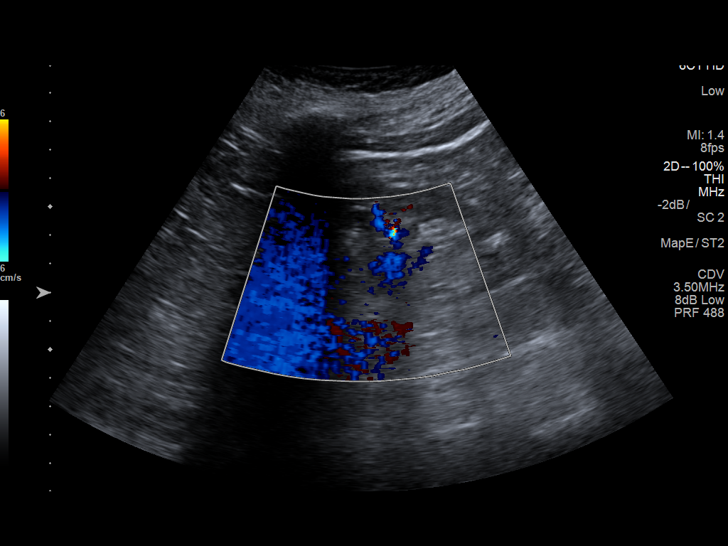
[im 26/42]
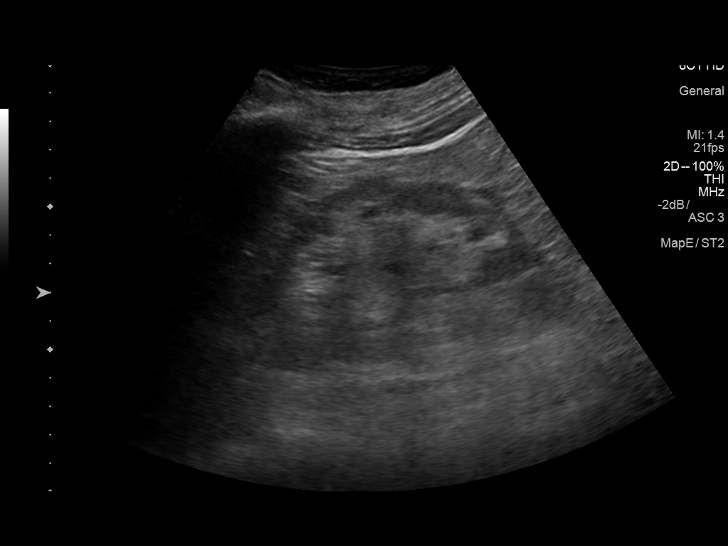
[im 28/42]
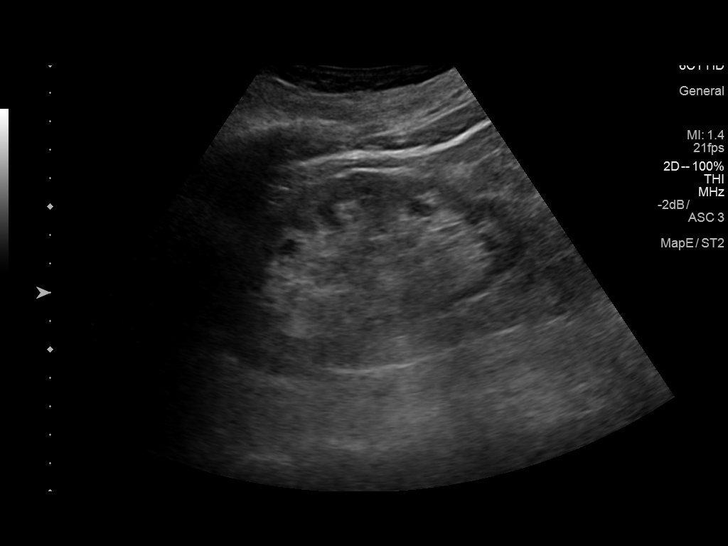
[im 31/42]
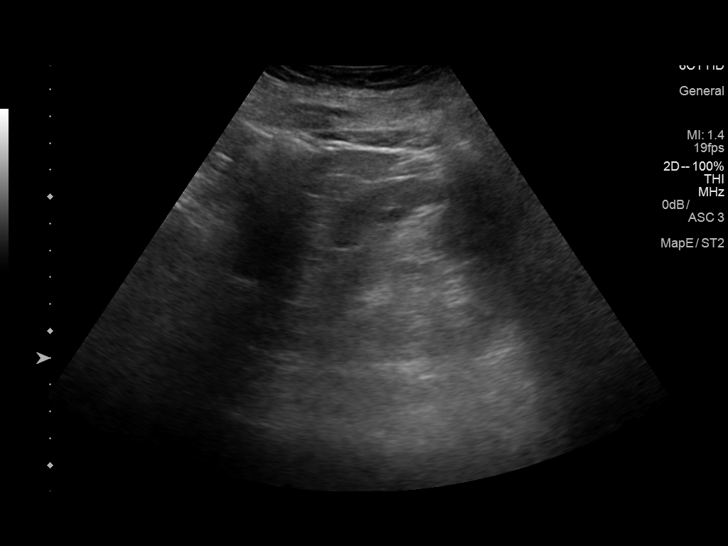
[im 35/42]
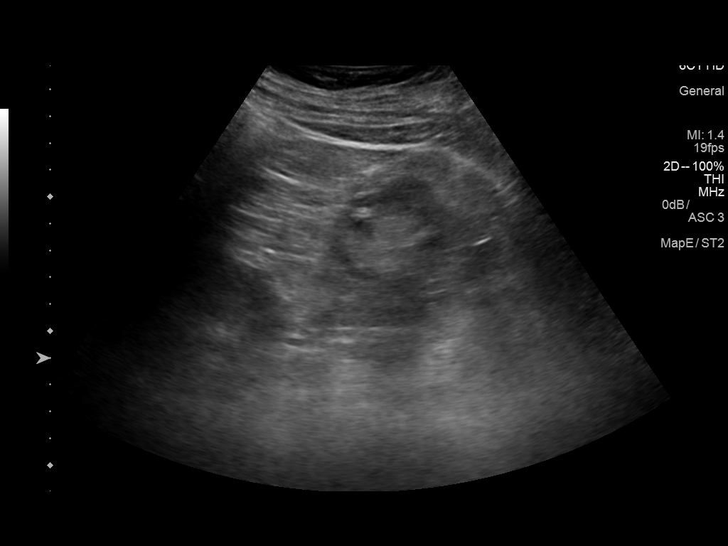
[im 38/42]
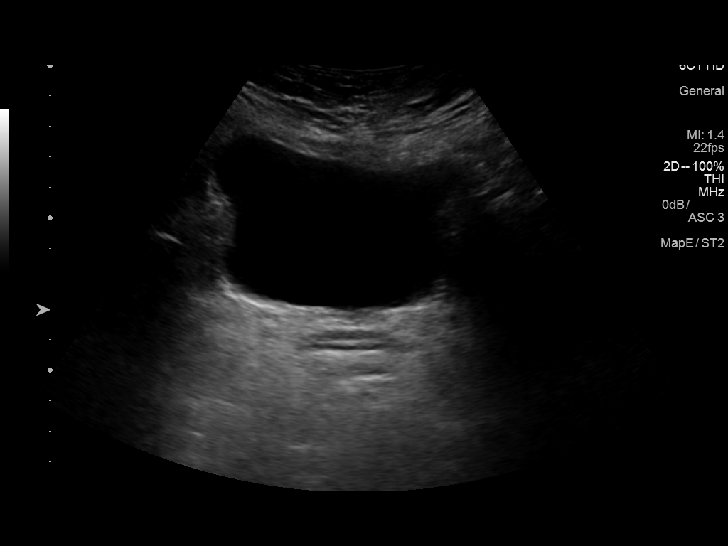
[im 42/42]
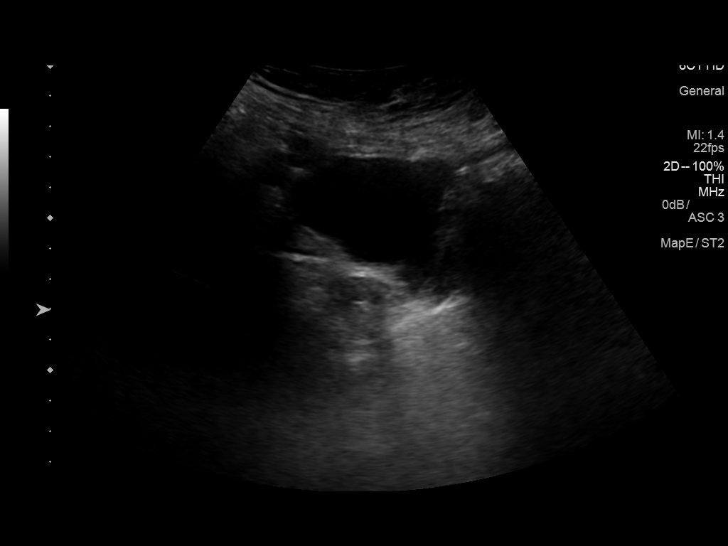

[14 of 25 positions shown; findings below may reference images not displayed]

FINDINGS: Right Kidney:

Length: 11.9 cm. Echogenicity within normal limits. No mass or
hydronephrosis visualized. Prominence of renal sinus adipose tissue.
Mild cortical thinning.

Left Kidney:

Length: 11.7 cm. Echogenicity within normal limits. No mass or
hydronephrosis visualized. Prominence of renal sinus adipose tissue.
Mild cortical thinning.

Bladder:

Appears normal for degree of bladder distention.
IMPRESSION: 1. Mild bilateral renal sinus lipomatosis, with mild cortical
thinning attributable to atrophy bilaterally, but no significant
abnormal focal lesion identified. No hydronephrosis or stones
identified.

## 2017-09-26 ENCOUNTER — Other Ambulatory Visit: Payer: Self-pay | Admitting: Neurology

## 2017-09-28 ENCOUNTER — Other Ambulatory Visit: Payer: Self-pay | Admitting: Neurology

## 2017-10-07 ENCOUNTER — Ambulatory Visit: Payer: Medicare Other | Admitting: Neurology

## 2017-10-07 ENCOUNTER — Encounter

## 2017-10-07 ENCOUNTER — Encounter: Payer: Self-pay | Admitting: Neurology

## 2017-10-07 VITALS — BP 126/74 | HR 65 | Ht 62.0 in | Wt 140.0 lb

## 2017-10-07 DIAGNOSIS — K5909 Other constipation: Secondary | ICD-10-CM | POA: Diagnosis not present

## 2017-10-07 DIAGNOSIS — R208 Other disturbances of skin sensation: Secondary | ICD-10-CM | POA: Insufficient documentation

## 2017-10-07 DIAGNOSIS — G20 Parkinson's disease: Secondary | ICD-10-CM

## 2017-10-07 DIAGNOSIS — R269 Unspecified abnormalities of gait and mobility: Secondary | ICD-10-CM | POA: Diagnosis not present

## 2017-10-07 MED ORDER — CARBIDOPA-LEVODOPA 25-100 MG PO TABS: 1.0000 | ORAL_TABLET | Freq: Four times a day (QID) | ORAL | 3 refills | Status: DC

## 2017-10-07 NOTE — Progress Notes (Signed)
[facility]  PATIENT: Stacey Walker DOB: June 04, 1925  REFERRING DOCTOR OR PCP:  Leanna Battles SOURCE: patient, daughter, EMR records, CT images in PACS  _________________________________   HISTORICAL  CHIEF COMPLAINT:  Chief Complaint  Patient presents with  . Parkinson's Disease    She is here with her daughter, [name], for her yearly follow up.  She is reporting tingling sensations in her bilateral hand (feels like sand is running through her hands).     HISTORY OF PRESENT ILLNESS:  Stacey Walker is an 84 year old right-handed woman with Parkinson's disease and hand/wrist pain  Update 10/07/2017: Her gait is poor and she needs to use her walker.   She takes Sinemet 4 times a day and feels it helps the gait.  She has not had any falls.  She is able to walk about 100 feet and possibly more with her walker.  She feels that the length of her stride improved when she went on Sinemet.  Her handwriting is poor and she rarely writes now.   No tremors.   Her bowel issues are doing better and constipation is milder now.    She takes Miralax if any constipation.   She has urinary urgency, unchanged.     She reports a mild dysesthesia on both hands, like sand rubbing against them.   She denies any weakness in the hands.  She also has some wrist pain.  She noes blurry vision but has gluacoma and macular degeneration.      From 06/09/2016: She presented with a poor gait and reduced ability to walk in 2016 following a hospital stay for aspiration pneumonia.. She improved after a few months but then worsened again there was also a mild tremor. Sinemet was started and she had significant improvement in her gait and tremor. She is currently on Sinemet 25/100 3 times a day. She tolerates it well though feels her constipation worsened on it.   Currently, she uses a walker and can walk at least 100 feet without rest.     She notes her hands are numb and her handwriting is worse.  The  numbness is in her fingertips.      She has joint pain in both hands.      Cognition has been stable. The daughter needs to help her with bills but she also has poor eyesight and poor handwriting.   She has had mild forgetfulness at times  She has some urinary urgency and rare incontinence as she enters the bathroom.    She has insomnia many nights, helped by trazodone.  She has carotid arterial stenosis, with with 60-70% stenosis on the right and 70% on the left.  She also has about 50% bilateral subclavian stenosis.  REVIEW OF SYSTEMS: Constitutional: No fevers, chills, sweats, or change in appetite.  She notes fatigue and is sometimes sleepy. Eyes: Poor vision due to macular degeneration. Ear, nose and throat: No hearing loss, ear pain, nasal congestion, sore throat Cardiovascular: No chest pain, palpitations.   She has carotid artery stenosis. Respiratory: No shortness of breath at rest or with exertion.   No wheezes GastrointestinaI: No nausea, vomiting, diarrhea, abdominal pain, fecal incontinence.  She has noticed some difficulty swallowing. She has occasional constipation. Genitourinary: No dysuria, urinary retention or frequency.  No nocturia. Musculoskeletal: No neck pain, back pain.  Has some hip tenderness and had hip surgery in 2009. Integumentary: No rash.  Notes some moles and itching. Neurological: as above Psychiatric: No depression at  this time.  No anxiety Endocrine: No palpitations, diaphoresis, change in appetite, change in weigh or increased thirst Hematologic/Lymphatic: No anemia, purpura, petechiae. Allergic/Immunologic: No itchy/runny eyes, rashes.  Sometimes has runny nose.  ALLERGIES: No Known Allergies  HOME MEDICATIONS:  Current Outpatient Medications:  .  acetaminophen (TYLENOL) 325 MG tablet, Take 650 mg by mouth every 6 (six) hours as needed for moderate pain., Disp: , Rfl:  .  aspirin EC 81 MG tablet, Take 81 mg by mouth daily., Disp: , Rfl:  .   bisacodyl (DULCOLAX) 10 MG suppository, Place 1 suppository (10 mg total) rectally daily., Disp: 12 suppository, Rfl: 0 .  carbidopa-levodopa (SINEMET IR) 25-100 MG tablet, Take 1 tablet by mouth 4 (four) times daily., Disp: 360 tablet, Rfl: 3 .  docusate sodium (COLACE) 100 MG capsule, Take 100 mg by mouth at bedtime., Disp: , Rfl:  .  ezetimibe-simvastatin (VYTORIN) 10-40 MG tablet, Take 1 tablet by mouth daily. NEED OV., Disp: 15 tablet, Rfl: 0 .  ipratropium (ATROVENT) 0.03 % nasal spray, Place 1 spray into both nostrils 3 (three) times daily. , Disp: , Rfl:  .  lactulose (CEPHULAC) 10 g packet, Take 10 g by mouth 3 (three) times daily., Disp: , Rfl:  .  LUMIGAN 0.01 % SOLN, Place 1 drop into both eyes at bedtime., Disp: , Rfl:  .  Multiple Vitamin (MULTIVITAMIN WITH MINERALS) TABS tablet, Take 1 tablet by mouth daily., Disp: , Rfl:  .  omega-3 acid ethyl esters (LOVAZA) 1 g capsule, Take 1 g by mouth daily., Disp: , Rfl:  .  polyethylene glycol (MIRALAX / GLYCOLAX) packet, Take 17 g by mouth 2 (two) times daily., Disp: 14 each, Rfl: 0 .  timolol (BETIMOL) 0.5 % ophthalmic solution, Place 1 drop into both eyes daily. , Disp: , Rfl:  .  traZODone (DESYREL) 50 MG tablet, 1 tablet by mouth at bedtime, Disp: 30 tablet, Rfl: 0 .  triamterene-hydrochlorothiazide (MAXZIDE-25) 37.5-25 MG tablet, Take 1 tablet by mouth daily., Disp: , Rfl:  .  valsartan (DIOVAN) 160 MG tablet, Take 1 tablet by mouth daily., Disp: , Rfl:   PAST MEDICAL HISTORY: Past Medical History:  Diagnosis Date  . Asymptomatic carotid artery stenosis    Dopplers January 2016: RICA 50-69% tortuous (no change; L ICA ~70%, tortuous (no change), bilateral subclavian arteries <50%  . Bladder cancer ([place])   . Cervical cancer ([place])   . Coronary artery disease   . Dyslipidemia    Managed by PCP. On statin  . Glaucoma   . Hypertension   . Hypertension, essential, benign   . Moderate Right-sided carotid artery disease    50-70%  stenosis on the right carotid artery from 2011. Also noted less than 50% right and left subclavian artery stenosis.  . Parkinson's disease ([place])     PAST SURGICAL HISTORY: Past Surgical History:  Procedure Laterality Date  . ABDOMINAL HYSTERECTOMY    . BLADDER TUMOR EXCISION    . Carotid Dopplers  July 2011   50-70% stenosis of the right carotid with <50% stenosis of both the right and left subclavian arteries.  . CHOLECYSTECTOMY    . NM MYOVIEW LTD  August 2007   No ischemia or infarction  . TRANSTHORACIC ECHOCARDIOGRAM  August 7   Normal EF, impaired relaxation with mildly sclerotic aortic valve but no stenosis. Mild left atrial dilation.    FAMILY HISTORY: Family History  Problem Relation Age of Onset  . Dementia Mother   . Lung cancer Father  SOCIAL HISTORY:  Social History   Socioeconomic History  . Marital status: Widowed    Spouse name: Not on file  . Number of children: Not on file  . Years of education: Not on file  . Highest education level: Not on file  Occupational History  . Not on file  Social Needs  . Financial resource strain: Not on file  . Food insecurity:    Worry: Not on file    Inability: Not on file  . Transportation needs:    Medical: Not on file    Non-medical: Not on file  Tobacco Use  . Smoking status: Former Smoker    Last attempt to quit: 03/08/1978    Years since quitting: 39.6  . Smokeless tobacco: Never Used  Substance and Sexual Activity  . Alcohol use: Yes    Comment: occas  . Drug use: No  . Sexual activity: Not on file  Lifestyle  . Physical activity:    Days per week: Not on file    Minutes per session: Not on file  . Stress: Not on file  Relationships  . Social connections:    Talks on phone: Not on file    Gets together: Not on file    Attends religious service: Not on file    Active member of club or organization: Not on file    Attends meetings of clubs or organizations: Not on file    Relationship status: Not  on file  . Intimate partner violence:    Fear of current or ex partner: Not on file    Emotionally abused: Not on file    Physically abused: Not on file    Forced sexual activity: Not on file  Other Topics Concern  . Not on file  Social History Narrative   Widowed mother of one, grandmother of one. She quit smoking in 1980. She has a rare alcohol beverage from time to time. She currently lives at [place] on [address].   She is able to get around there without to much difficulty.   During previous visit, she voiced the desire for DO NOT RESUSCITATE and DO NOT INTUBATE. She also is with the desire to avoid any invasive procedures.      PHYSICAL EXAM  Vitals:   10/07/17 1126  BP: 126/74  Pulse: 65  Weight: 140 lb (63.5 kg)  Height: 5\' 2"  (1.575 m)    Body mass index is 25.61 kg/m.   General: The patient is well-developed and well-nourished and in no acute distress   Neurologic Exam  Mental status: The patient is alert and oriented x 3 at the time of the examination.     Speech is normal.  Cranial nerves: Extraocular movements are full.  Facial symmetry is present.  Facial strength is normal.  Trapezius and sternocleidomastoid strength is normal. No dysarthria is noted.    No obvious hearing deficits are noted.  Motor:  Muscle bulk is normal.   Tone is normal. There is no cogwheeling. Strength is  5 / 5 in the arms except 4+/5 in the APB muscles of both hands and 4+/5 in the legs..   Sensory: She notes decreased sensation over the thenar eminence and adjacent palm in both hands and notes mild allodynia there.  She has mild Tinel signs.  Gait and station: Station is mildly wide stance.   She can take some steps without her walker but her stride is much longer and she turns well when she  uses the walker  .  She is unable to tandem walk   Romberg is negative.   Reflexes: Deep tendon reflexes are symmetric and 1+ bilaterally  knees.      DIAGNOSTIC DATA  (LABS, IMAGING, TESTING) - I reviewed patient records, labs, notes, testing and imaging myself where available.  Lab Results  Component Value Date   WBC 10.5 10/16/2015   HGB 12.7 10/16/2015   HCT 37.5 10/16/2015   MCV 87.2 10/16/2015   PLT 465 (H) 10/16/2015      Component Value Date/Time   NA 132 (L) 10/16/2015 0506   K 4.5 10/16/2015 0506   CL 102 10/16/2015 0506   CO2 23 10/16/2015 0506   GLUCOSE 121 (H) 10/16/2015 0506   BUN 8 10/16/2015 0506   CREATININE 0.50 10/16/2015 0506   CALCIUM 8.8 (L) 10/16/2015 0506   PROT 5.9 (L) 10/05/2015 1853   ALBUMIN 2.8 (L) 10/05/2015 1853   AST 33 10/05/2015 1853   ALT 18 10/05/2015 1853   ALKPHOS 70 10/05/2015 1853   BILITOT 1.2 10/05/2015 1853   GFRNONAA >60 10/16/2015 0506   GFRAA >60 10/16/2015 0506       ASSESSMENT AND PLAN  Parkinson's disease (HCC)  Gait disorder  Chronic constipation  Dysesthesia   1.   She will continue Sinemet at the current dose.  Refills were sent in for the next year.   2.   Continue trazodone 50 mg po qHS .   3.    The hand dysesthesias minimal thumb weakness probably represents moderate bilateral carpal tunnel syndromes.  Is not interested in proceeding with an NCV/EMG as she would not do surgery if it is positive.   RTC 12 months, sooner if problems   Akashdeep Chuba A. Felecia Shelling, MD, PhD 06/07/6832, 1:96 PM Certified in Neurology, Clinical Neurophysiology, Sleep Medicine, Pain Medicine and Neuroimaging  [facility] [address] [place], [place]

## 2017-10-24 ENCOUNTER — Other Ambulatory Visit: Payer: Self-pay | Admitting: Neurology

## 2017-12-29 ENCOUNTER — Telehealth: Payer: Self-pay | Admitting: Neurology

## 2017-12-29 NOTE — Telephone Encounter (Signed)
I called [name] back and relayed message below. She verbalized understanding and appreciation.

## 2017-12-29 NOTE — Telephone Encounter (Signed)
Spoke with Dr. Felecia Shelling- got VO that patient can increase trazodone to 1.5 tablets qhs (total 75mg ) to see if this will help with sleep. If this works well, we can call in new prescription for this dose

## 2017-12-29 NOTE — Telephone Encounter (Signed)
Patient 's daughter is calling in states patient is only sleeping five hours  Trazodone 50 mg . Daughter states can her RX be adjusted . Daughter has been noticing this she has been getting five hours of sleep the last few weeks.   Daughter can be reached before 3:20 at [phone] after 3:20 [phone]

## 2018-02-14 ENCOUNTER — Telehealth: Payer: Self-pay | Admitting: Neurology

## 2018-02-14 MED ORDER — TRAZODONE HCL 50 MG PO TABS: 75.0000 mg | ORAL_TABLET | Freq: Every evening | ORAL | 5 refills | Status: DC | PRN

## 2018-02-14 NOTE — Telephone Encounter (Signed)
Per RAS, ok to increase Trazodone to 75mg  qhs prn. New rx. escribed to [facility]

## 2018-02-14 NOTE — Telephone Encounter (Signed)
Pt daughter [name] called stating the pt is needing a new rx with dosing change for traZODone (DESYREL) 50 MG tablet sent to [facility]. Stating the pt takes 1.5 tablets.

## 2018-02-16 MED ORDER — TRAZODONE HCL 50 MG PO TABS: 75.0000 mg | ORAL_TABLET | Freq: Every evening | ORAL | 5 refills | Status: DC | PRN

## 2018-02-16 NOTE — Addendum Note (Signed)
Addended by: France Ravens I on: 02/16/2018 04:31 PM   Modules accepted: Orders

## 2018-02-16 NOTE — Telephone Encounter (Signed)
Trazodone escribed again to [facility], with clarified dosing instructions/fim

## 2018-07-28 ENCOUNTER — Ambulatory Visit: Payer: Medicare Other | Admitting: Podiatry

## 2018-08-08 ENCOUNTER — Ambulatory Visit: Payer: Medicare Other | Admitting: Sports Medicine

## 2018-08-13 ENCOUNTER — Other Ambulatory Visit: Payer: Self-pay | Admitting: Neurology

## 2018-08-15 ENCOUNTER — Encounter: Payer: Self-pay | Admitting: Podiatry

## 2018-08-15 ENCOUNTER — Ambulatory Visit: Payer: Medicare Other | Admitting: Podiatry

## 2018-08-15 ENCOUNTER — Other Ambulatory Visit: Payer: Self-pay

## 2018-08-15 VITALS — Temp 97.3°F

## 2018-08-15 DIAGNOSIS — Q828 Other specified congenital malformations of skin: Secondary | ICD-10-CM | POA: Diagnosis not present

## 2018-08-15 DIAGNOSIS — M2580 Other specified joint disorders, unspecified joint: Secondary | ICD-10-CM | POA: Diagnosis not present

## 2018-08-15 NOTE — Progress Notes (Signed)
This patient presents the office with chief complaint of a painful callus under the ball of her right foot.  She says she has a very painful callus which is painful walking and wearing her shoes.  Patient is unable to self treat.  She presents the office today for an evaluation and treatment of this painful callus.  She is accompanied by her daughter to this visit.  Vascular  Dorsalis pedis and posterior tibial pulses are palpable  B/L.  Capillary return  WNL.  Temperature gradient is  WNL.  Skin turgor  WNL  Sensorium  Senn Weinstein monofilament wire  WNL. Normal tactile sensation.  Nail Exam  Patient has normal nails with no evidence of bacterial or fungal infection.  Orthopedic  Exam  Muscle tone and muscle strength  WNL.  No limitations of motion feet  B/L.  No crepitus or joint effusion noted.  Foot type is unremarkable and digits show no abnormalities.  Bony prominences are unremarkable. Prominent tibial sesamoid right foot.    Skin  No open lesions.  Normal skin texture and turgor.  Porokeratosis sub 1 right foot.  Porokeratosis sub 1 right foot.  Hypertropic tibial sesamoid right foot.  ROV.  Debride porokeratosis sub 1 right foot.  Porokeratosis forms due to enlarged sesamoid bone.  Padding applied.  RTC prn   Gardiner Barefoot DPM

## 2018-08-17 ENCOUNTER — Other Ambulatory Visit: Payer: Self-pay | Admitting: Neurology

## 2018-10-12 ENCOUNTER — Other Ambulatory Visit: Payer: Self-pay | Admitting: Neurology

## 2018-10-13 ENCOUNTER — Ambulatory Visit: Payer: Medicare Other | Admitting: Family Medicine

## 2018-10-13 ENCOUNTER — Ambulatory Visit: Payer: Medicare Other | Admitting: Nurse Practitioner

## 2018-11-01 ENCOUNTER — Telehealth: Payer: Self-pay | Admitting: Neurology

## 2018-11-01 NOTE — Telephone Encounter (Signed)
Patient's daughter [name] called our office today to reschedule her appointment from 8/7. She also requested to speak to an Glass blower/designer. She is upset because they had received an automated reminder call for her appointment on 8/7, and when she brought her mother to our office we were closed. She also mentioned she had been trying to call our office the past few days to address this but couldn't get through to anyone. I let her know a manager wasn't available to speak with her, but that I could have them call her back. She said the best way to contact her was [phone].

## 2018-11-16 DIAGNOSIS — Z0000 Encounter for general adult medical examination without abnormal findings: Secondary | ICD-10-CM | POA: Insufficient documentation

## 2019-01-04 ENCOUNTER — Ambulatory Visit: Payer: Medicare Other | Admitting: Neurology

## 2019-01-04 ENCOUNTER — Other Ambulatory Visit: Payer: Self-pay

## 2019-01-04 ENCOUNTER — Encounter: Payer: Self-pay | Admitting: Neurology

## 2019-01-04 VITALS — BP 131/66 | HR 66 | Temp 98.7°F | Wt 131.0 lb

## 2019-01-04 DIAGNOSIS — G4700 Insomnia, unspecified: Secondary | ICD-10-CM | POA: Diagnosis not present

## 2019-01-04 DIAGNOSIS — K5909 Other constipation: Secondary | ICD-10-CM

## 2019-01-04 DIAGNOSIS — N3941 Urge incontinence: Secondary | ICD-10-CM

## 2019-01-04 DIAGNOSIS — G20 Parkinson's disease: Secondary | ICD-10-CM | POA: Diagnosis not present

## 2019-01-04 MED ORDER — TRAZODONE HCL 50 MG PO TABS: ORAL_TABLET | ORAL | 4 refills | Status: DC

## 2019-01-04 MED ORDER — CARBIDOPA-LEVODOPA 25-100 MG PO TABS: 1.0000 | ORAL_TABLET | Freq: Four times a day (QID) | ORAL | 4 refills | Status: DC

## 2019-01-04 NOTE — Progress Notes (Signed)
[facility]  PATIENT: Stacey Walker DOB: 06-17-1925  REFERRING DOCTOR OR PCP:  Leanna Battles SOURCE: patient, daughter, EMR records, CT images in PACS  _________________________________   HISTORICAL  CHIEF COMPLAINT:  Chief Complaint  Patient presents with  . Follow-up    PArkinsons disease with [name] and her temp is 97.5 pt at [facility]:  Stacey Walker is an 85 year old right-handed woman with Parkinson's disease   Update 01/04/2019: She denies any new neuro symptom.   She uses a walker for short distances (usually uses in room only but could go 50-100 feet).   She uses an IT trainer wheelchair when she leaves her room.     .   She denies tremors.   However, handwriting is a little worse.     She has urinary incontinence (old) and rare constipation.  She takes colace daily and Miralax as needed with benefit.   She takes dried fruit daily.   She has trouble swallowing pills now.     She denies major cognitive issues but feels processing is reduced.  She had trouble learning how to use the TV remote.   She reports a mild dysesthesia on both hands, like sand rubbing against them.   She denies any weakness in the hands.    Update 10/07/2017: Her gait is poor and she needs to use her walker.   She takes Sinemet 4 times a day and feels it helps the gait.  She has not had any falls.  She is able to walk about 100 feet and possibly more with her walker.  She feels that the length of her stride improved when she went on Sinemet.  Her handwriting is poor and she rarely writes now.   No tremors.   Her bowel issues are doing better and constipation is milder now.    She takes Miralax if any constipation.   She has urinary urgency, unchanged.     She reports a mild dysesthesia on both hands, like sand rubbing against them.   She denies any weakness in the hands.  She also has some wrist pain.  She noes blurry vision but has gluacoma and  macular degeneration.      From 06/09/2016: She presented with a poor gait and reduced ability to walk in 2016 following a hospital stay for aspiration pneumonia.. She improved after a few months but then worsened again there was also a mild tremor. Sinemet was started and she had significant improvement in her gait and tremor. She is currently on Sinemet 25/100 3 times a day. She tolerates it well though feels her constipation worsened on it.   Currently, she uses a walker and can walk at least 100 feet without rest.     She notes her hands are numb and her handwriting is worse.  The numbness is in her fingertips.      She has joint pain in both hands.      Cognition has been stable. The daughter needs to help her with bills but she also has poor eyesight and poor handwriting.   She has had mild forgetfulness at times  She has some urinary urgency and rare incontinence as she enters the bathroom.    She has insomnia many nights, helped by trazodone.  She has carotid arterial stenosis, with with 60-70% stenosis on the right and 70% on the left.  She also has about 50% bilateral subclavian stenosis.  REVIEW OF SYSTEMS: Constitutional:  No fevers, chills, sweats, or change in appetite.  She notes fatigue and is sometimes sleepy. Eyes: Poor vision due to macular degeneration. Ear, nose and throat: No hearing loss, ear pain, nasal congestion, sore throat Cardiovascular: No chest pain, palpitations.   She has carotid artery stenosis. Respiratory: No shortness of breath at rest or with exertion.   No wheezes GastrointestinaI: No nausea, vomiting, diarrhea, abdominal pain, fecal incontinence.  She has noticed some difficulty swallowing. She has occasional constipation. Genitourinary: No dysuria, urinary retention or frequency.  No nocturia. Musculoskeletal: No neck pain, back pain.  Has some hip tenderness and had hip surgery in 2009. Integumentary: No rash.  Notes some moles and itching.  Neurological: as above Psychiatric: No depression at this time.  No anxiety Endocrine: No palpitations, diaphoresis, change in appetite, change in weigh or increased thirst Hematologic/Lymphatic: No anemia, purpura, petechiae. Allergic/Immunologic: No itchy/runny eyes, rashes.  Sometimes has runny nose.  ALLERGIES: No Known Allergies  HOME MEDICATIONS:  Current Outpatient Medications:  .  acetaminophen (TYLENOL) 325 MG tablet, Take 650 mg by mouth every 6 (six) hours as needed for moderate pain., Disp: , Rfl:  .  aspirin EC 81 MG tablet, Take 81 mg by mouth daily., Disp: , Rfl:  .  bisacodyl (DULCOLAX) 10 MG suppository, Place 1 suppository (10 mg total) rectally daily., Disp: 12 suppository, Rfl: 0 .  carbidopa-levodopa (SINEMET IR) 25-100 MG tablet, Take 1 tablet by mouth 4 (four) times daily., Disp: 360 tablet, Rfl: 4 .  docusate sodium (COLACE) 100 MG capsule, Take 100 mg by mouth at bedtime., Disp: , Rfl:  .  ezetimibe-simvastatin (VYTORIN) 10-40 MG tablet, Take 1 tablet by mouth daily. NEED OV., Disp: 15 tablet, Rfl: 0 .  ipratropium (ATROVENT) 0.03 % nasal spray, Place 1 spray into both nostrils 3 (three) times daily. , Disp: , Rfl:  .  lactulose (CEPHULAC) 10 g packet, Take 10 g by mouth 3 (three) times daily., Disp: , Rfl:  .  LUMIGAN 0.01 % SOLN, Place 1 drop into both eyes at bedtime., Disp: , Rfl:  .  Multiple Vitamin (MULTIVITAMIN WITH MINERALS) TABS tablet, Take 1 tablet by mouth daily., Disp: , Rfl:  .  omega-3 acid ethyl esters (LOVAZA) 1 g capsule, Take 1 g by mouth daily., Disp: , Rfl:  .  polyethylene glycol (MIRALAX / GLYCOLAX) packet, Take 17 g by mouth 2 (two) times daily., Disp: 14 each, Rfl: 0 .  timolol (TIMOPTIC) 0.5 % ophthalmic solution, INSTILL 1 DROP INTO EACH EYE IN THE MORNING, Disp: , Rfl:  .  traZODone (DESYREL) 50 MG tablet, TAKE 1 AND 1/2 TABLET (75 MG TOTAL) BY MOUTH AT BEDTIME AS NEEDED FOR SLEEP, Disp: 135 tablet, Rfl: 4 .   triamterene-hydrochlorothiazide (MAXZIDE-25) 37.5-25 MG tablet, Take 1 tablet by mouth daily., Disp: , Rfl:  .  valsartan (DIOVAN) 160 MG tablet, Take 1 tablet by mouth daily., Disp: , Rfl:   PAST MEDICAL HISTORY: Past Medical History:  Diagnosis Date  . Asymptomatic carotid artery stenosis    Dopplers January 2016: RICA 50-69% tortuous (no change; L ICA ~70%, tortuous (no change), bilateral subclavian arteries <50%  . Bladder cancer ([facility])   . Cervical cancer ([facility])   . Coronary artery disease   . Dyslipidemia    Managed by PCP. On statin  . Glaucoma   . Hypertension   . Hypertension, essential, benign   . Moderate Right-sided carotid artery disease    50-70% stenosis on the right carotid artery from 2011.  Also noted less than 50% right and left subclavian artery stenosis.  . Parkinson's disease ([place])     PAST SURGICAL HISTORY: Past Surgical History:  Procedure Laterality Date  . ABDOMINAL HYSTERECTOMY    . BLADDER TUMOR EXCISION    . Carotid Dopplers  July 2011   50-70% stenosis of the right carotid with <50% stenosis of both the right and left subclavian arteries.  . CHOLECYSTECTOMY    . NM MYOVIEW LTD  August 2007   No ischemia or infarction  . TRANSTHORACIC ECHOCARDIOGRAM  August 7   Normal EF, impaired relaxation with mildly sclerotic aortic valve but no stenosis. Mild left atrial dilation.    FAMILY HISTORY: Family History  Problem Relation Age of Onset  . Dementia Mother   . Lung cancer Father     SOCIAL HISTORY:  Social History   Socioeconomic History  . Marital status: Widowed    Spouse name: Not on file  . Number of children: Not on file  . Years of education: Not on file  . Highest education level: Not on file  Occupational History  . Not on file  Social Needs  . Financial resource strain: Not on file  . Food insecurity    Worry: Not on file    Inability: Not on file  . Transportation needs    Medical: Not on file    Non-medical: Not on file   Tobacco Use  . Smoking status: Former Smoker    Quit date: 03/08/1978    Years since quitting: 40.8  . Smokeless tobacco: Never Used  Substance and Sexual Activity  . Alcohol use: Yes    Comment: occas  . Drug use: No  . Sexual activity: Not on file  Lifestyle  . Physical activity    Days per week: Not on file    Minutes per session: Not on file  . Stress: Not on file  Relationships  . Social Herbalist on phone: Not on file    Gets together: Not on file    Attends religious service: Not on file    Active member of club or organization: Not on file    Attends meetings of clubs or organizations: Not on file    Relationship status: Not on file  . Intimate partner violence    Fear of current or ex partner: Not on file    Emotionally abused: Not on file    Physically abused: Not on file    Forced sexual activity: Not on file  Other Topics Concern  . Not on file  Social History Narrative   Widowed mother of one, grandmother of one. She quit smoking in 1980. She has a rare alcohol beverage from time to time. She currently lives at [facility] on [address].   She is able to get around there without to much difficulty.   During previous visit, she voiced the desire for DO NOT RESUSCITATE and DO NOT INTUBATE. She also is with the desire to avoid any invasive procedures.      PHYSICAL EXAM  Vitals:   01/04/19 1547  BP: 131/66  Pulse: 66  Temp: 98.7 F (37.1 C)  Weight: 131 lb (59.4 kg)    Body mass index is 23.96 kg/m.   General: The patient is well-developed and well-nourished and in no acute distress   Neurologic Exam  Mental status: The patient is alert and oriented x 3 at the time of the examination.  Speech is normal.  Cranial nerves: Extraocular movements are full.  Facial symmetry is present.  Facial strength is normal.  Trapezius and sternocleidomastoid strength is normal. No dysarthria is noted.    No obvious hearing deficits are  noted.  Motor:  Muscle bulk is normal.   Tone is normal. There is no cogwheeling. Strength is  5 / 5 in the arms except 4+/5 in the APB muscles and proximal legs.  Sensory: She notes decreased sensation over the thenar eminence and adjacent palm in both hands and notes mild allodynia there.    Gait and station: Station is mildly wide stance.  She has a small stride without a walker but she walks well with a walker and stride improved to be normal for age.  Cannot tandem without a walker.  Romberg is negative.   Reflexes: Deep tendon reflexes are symmetric and 1+ bilaterally  knees.      DIAGNOSTIC DATA (LABS, IMAGING, TESTING) - I reviewed patient records, labs, notes, testing and imaging myself where available.  Lab Results  Component Value Date   WBC 10.5 10/16/2015   HGB 12.7 10/16/2015   HCT 37.5 10/16/2015   MCV 87.2 10/16/2015   PLT 465 (H) 10/16/2015      Component Value Date/Time   NA 132 (L) 10/16/2015 0506   K 4.5 10/16/2015 0506   CL 102 10/16/2015 0506   CO2 23 10/16/2015 0506   GLUCOSE 121 (H) 10/16/2015 0506   BUN 8 10/16/2015 0506   CREATININE 0.50 10/16/2015 0506   CALCIUM 8.8 (L) 10/16/2015 0506   PROT 5.9 (L) 10/05/2015 1853   ALBUMIN 2.8 (L) 10/05/2015 1853   AST 33 10/05/2015 1853   ALT 18 10/05/2015 1853   ALKPHOS 70 10/05/2015 1853   BILITOT 1.2 10/05/2015 1853   GFRNONAA >60 10/16/2015 0506   GFRAA >60 10/16/2015 0506       ASSESSMENT AND PLAN  Parkinson's disease (HCC)  Chronic constipation  Urge incontinence  Insomnia, unspecified type   1.   Continue Sinemet.  Refills were sent in for the next year.   2.   Continue trazodone 75 mg po qHS .  Refill for the year 3.    RTC 12 months, sooner if problems.  His primary care is able to write for her prescription and she  remains stable we can change this to as needed.   Richard A. Felecia Shelling, MD, PhD [id number], [id number] PM Certified in Neurology, Clinical Neurophysiology, Sleep Medicine, Pain  Medicine and Neuroimaging  [facility] [address] [place], [place]

## 2019-01-10 ENCOUNTER — Other Ambulatory Visit: Payer: Self-pay | Admitting: Neurology

## 2019-03-07 ENCOUNTER — Other Ambulatory Visit: Payer: Self-pay

## 2019-03-07 ENCOUNTER — Encounter: Payer: Self-pay | Admitting: Family Medicine

## 2019-03-07 ENCOUNTER — Ambulatory Visit: Payer: Medicare Other | Admitting: Family Medicine

## 2019-03-07 VITALS — BP 124/53 | HR 57 | Temp 97.8°F | Ht 62.0 in | Wt 127.6 lb

## 2019-03-07 DIAGNOSIS — R531 Weakness: Secondary | ICD-10-CM

## 2019-03-07 DIAGNOSIS — R410 Disorientation, unspecified: Secondary | ICD-10-CM | POA: Diagnosis not present

## 2019-03-07 DIAGNOSIS — G20 Parkinson's disease: Secondary | ICD-10-CM

## 2019-03-07 NOTE — Progress Notes (Signed)
I have read the note, and I agree with the clinical assessment and plan.  Geremiah Fussell A. Cheyne Boulden, MD, PhD, FAAN Certified in Neurology, Clinical Neurophysiology, Sleep Medicine, Pain Medicine and Neuroimaging  [facility] [address], [address] [place], [place] [address] [phone]  

## 2019-03-07 NOTE — Progress Notes (Signed)
PATIENT: Stacey Walker DOB: [birth year]  REASON FOR VISIT: follow up HISTORY FROM: patient  Chief Complaint  Patient presents with  . Follow-up    2 mon f/u. Daughter present. Rm 8. Patient's daughter stated that she sleeping alot more and has some weakness.      HISTORY OF PRESENT ILLNESS: Today 03/07/19 Stacey Walker is a 85 y.o. female here today for follow up.  She presents today with her daughter who aids in history.  Her daughter reports that over the past month she has seen a pretty rapid progression of confusion and weakness.  She reports that her mother took all of her medications before noon about 2 weeks ago.  She has a history of chronic constipation.  She has been followed closely by her primary care over the last month with work-up for acute confusion and weakness.  KUB revealed concerns of impaction.  Impaction has been resolved and patient is now having regular bowel movements.  Her daughter reports that her appetite drastically changed during this period of time and she lost about 10 pounds.  Since disimpaction, weight loss has stopped.  In fact she may have gained a few pounds.  Weakness seems somewhat improved.  She continues to be a little more sleepy than usual.  Patient denies falls.  No head injuries.  She is able to perform ADLs at home.  She is walking with her walker.  HISTORY: (copied from Dr Garth Bigness note on 01/04/2019)  Stacey Walker is an 85 year old right-handed woman with Parkinson's disease   Update 01/04/2019: She denies any new neuro symptom.   She uses a walker for short distances (usually uses in room only but could go 50-100 feet).   She uses an IT trainer wheelchair when she leaves her room.     .   She denies tremors.   However, handwriting is a little worse.     She has urinary incontinence (old) and rare constipation.  She takes colace daily and Miralax as needed with benefit.   She takes dried fruit daily.   She has trouble swallowing pills now.       She denies major cognitive issues but feels processing is reduced.  She had trouble learning how to use the TV remote.   She reports a mild dysesthesia on both hands, like sand rubbing against them.   She denies any weakness in the hands.    Update 10/07/2017: Her gait is poor and she needs to use her walker.   She takes Sinemet 4 times a day and feels it helps the gait.  She has not had any falls.  She is able to walk about 100 feet and possibly more with her walker.  She feels that the length of her stride improved when she went on Sinemet.  Her handwriting is poor and she rarely writes now.   No tremors.   Her bowel issues are doing better and constipation is milder now.    She takes Miralax if any constipation.   She has urinary urgency, unchanged.     She reports a mild dysesthesia on both hands, like sand rubbing against them.   She denies any weakness in the hands.  She also has some wrist pain.  She noes blurry vision but has gluacoma and macular degeneration.      From 06/09/2016: She presented with a poor gait and reduced ability to walk in 2016 following a hospital stay for aspiration pneumonia.. She improved after a  few months but then worsened again there was also a mild tremor. Sinemet was started and she had significant improvement in her gait and tremor. She is currently on Sinemet 25/100 3 times a day. She tolerates it well though feels her constipation worsened on it.   Currently, she uses a walker and can walk at least 100 feet without rest.     She notes her hands are numb and her handwriting is worse.  The numbness is in her fingertips.      She has joint pain in both hands.      Cognition has been stable. The daughter needs to help her with bills but she also has poor eyesight and poor handwriting.   She has had mild forgetfulness at times  She has some urinary urgency and rare incontinence as she enters the bathroom.    She has insomnia many nights, helped by  trazodone.  She has carotid arterial stenosis, with with 60-70% stenosis on the right and 70% on the left.  She also has about 50% bilateral subclavian stenosis.   REVIEW OF SYSTEMS: Out of a complete 14 system review of symptoms, the patient complains only of the following symptoms, weakness, memory loss  and all other reviewed systems are negative.  ALLERGIES: No Known Allergies  HOME MEDICATIONS: Outpatient Medications Prior to Visit  Medication Sig Dispense Refill  . acetaminophen (TYLENOL) 325 MG tablet Take 650 mg by mouth every 6 (six) hours as needed for moderate pain.    Marland Kitchen aspirin EC 81 MG tablet Take 81 mg by mouth daily.    . carbidopa-levodopa (SINEMET IR) 25-100 MG tablet Take 1 tablet by mouth 4 (four) times daily. 360 tablet 4  . docusate sodium (COLACE) 100 MG capsule Take 100 mg by mouth at bedtime.    Marland Kitchen ezetimibe-simvastatin (VYTORIN) 10-40 MG tablet Take 1 tablet by mouth daily. NEED OV. 15 tablet 0  . ipratropium (ATROVENT) 0.03 % nasal spray Place 1 spray into both nostrils 3 (three) times daily.     Marland Kitchen lactulose (CEPHULAC) 10 g packet Take 10 g by mouth 3 (three) times daily.    Marland Kitchen LUMIGAN 0.01 % SOLN Place 1 drop into both eyes at bedtime.    . Multiple Vitamin (MULTIVITAMIN WITH MINERALS) TABS tablet Take 1 tablet by mouth daily.    Marland Kitchen omega-3 acid ethyl esters (LOVAZA) 1 g capsule Take 1 g by mouth daily.    . polyethylene glycol (MIRALAX / GLYCOLAX) packet Take 17 g by mouth 2 (two) times daily. 14 each 0  . timolol (TIMOPTIC) 0.5 % ophthalmic solution INSTILL 1 DROP INTO EACH EYE IN THE MORNING    . triamterene-hydrochlorothiazide (MAXZIDE-25) 37.5-25 MG tablet Take 1 tablet by mouth daily.    . valsartan (DIOVAN) 160 MG tablet Take 1 tablet by mouth daily.    . traZODone (DESYREL) 50 MG tablet TAKE 1 AND 1/2 TABLET (75 MG TOTAL) BY MOUTH AT BEDTIME AS NEEDED FOR SLEEP (Patient not taking: Reported on 03/07/2019) 135 tablet 4  . bisacodyl (DULCOLAX) 10 MG  suppository Place 1 suppository (10 mg total) rectally daily. 12 suppository 0   No facility-administered medications prior to visit.    PAST MEDICAL HISTORY: Past Medical History:  Diagnosis Date  . Asymptomatic carotid artery stenosis    Dopplers January 2016: RICA 50-69% tortuous (no change; L ICA ~70%, tortuous (no change), bilateral subclavian arteries <50%  . Bladder cancer ([place])   . Cervical cancer ([place])   . Coronary  artery disease   . Dyslipidemia    Managed by PCP. On statin  . Glaucoma   . Hypertension   . Hypertension, essential, benign   . Moderate Right-sided carotid artery disease    50-70% stenosis on the right carotid artery from 2011. Also noted less than 50% right and left subclavian artery stenosis.  . Parkinson's disease ([place])     PAST SURGICAL HISTORY: Past Surgical History:  Procedure Laterality Date  . ABDOMINAL HYSTERECTOMY    . BLADDER TUMOR EXCISION    . Carotid Dopplers  July 2011   50-70% stenosis of the right carotid with <50% stenosis of both the right and left subclavian arteries.  . CHOLECYSTECTOMY    . NM MYOVIEW LTD  August 2007   No ischemia or infarction  . TRANSTHORACIC ECHOCARDIOGRAM  August 7   Normal EF, impaired relaxation with mildly sclerotic aortic valve but no stenosis. Mild left atrial dilation.    FAMILY HISTORY: Family History  Problem Relation Age of Onset  . Dementia Mother   . Lung cancer Father     SOCIAL HISTORY: Social History   Socioeconomic History  . Marital status: Widowed    Spouse name: Not on file  . Number of children: Not on file  . Years of education: Not on file  . Highest education level: Not on file  Occupational History  . Not on file  Tobacco Use  . Smoking status: Former Smoker    Quit date: 03/08/1978    Years since quitting: 41.0  . Smokeless tobacco: Never Used  Substance and Sexual Activity  . Alcohol use: Yes    Comment: occas  . Drug use: No  . Sexual activity: Not on file  Other  Topics Concern  . Not on file  Social History Narrative   Widowed mother of one, grandmother of one. She quit smoking in 1980. She has a rare alcohol beverage from time to time. She currently lives at [place] on [address].   She is able to get around there without to much difficulty.   During previous visit, she voiced the desire for DO NOT RESUSCITATE and DO NOT INTUBATE. She also is with the desire to avoid any invasive procedures.    Social Determinants of Health   Financial Resource Strain:   . Difficulty of Paying Living Expenses: Not on file  Food Insecurity:   . Worried About Charity fundraiser in the Last Year: Not on file  . Ran Out of Food in the Last Year: Not on file  Transportation Needs:   . Lack of Transportation (Medical): Not on file  . Lack of Transportation (Non-Medical): Not on file  Physical Activity:   . Days of Exercise per Week: Not on file  . Minutes of Exercise per Session: Not on file  Stress:   . Feeling of Stress : Not on file  Social Connections:   . Frequency of Communication with Friends and Family: Not on file  . Frequency of Social Gatherings with Friends and Family: Not on file  . Attends Religious Services: Not on file  . Active Member of Clubs or Organizations: Not on file  . Attends Archivist Meetings: Not on file  . Marital Status: Not on file  Intimate Partner Violence:   . Fear of Current or Ex-Partner: Not on file  . Emotionally Abused: Not on file  . Physically Abused: Not on file  . Sexually Abused: Not on file  PHYSICAL EXAM  Vitals:   03/07/19 1542  BP: (!) 124/53  Pulse: (!) 57  Temp: 97.8 F (36.6 C)  TempSrc: Oral  Weight: 127 lb 9.6 oz (57.9 kg)  Height: 5\' 2"  (1.575 m)   Body mass index is 23.34 kg/m.  Generalized: Well developed, in no acute distress  Cardiology: normal rate and rhythm, no murmur noted Respiratory: Clear to auscultation anteriorly and posteriorly in all 4  lung fields. Neurological examination  Mentation: Alert oriented to time with exception of specific date, place with exception of name of our building, history taking. Follows all commands speech and language fluent Cranial nerve II-XII: Pupils were equal round reactive to light. Extraocular movements were full, visual field were full  Motor: The motor testing reveals 5 over 5 strength of bilateral upper extremities, 4+ of 5 of bilateral lower extremities. Good symmetric motor tone is noted throughout.  Sensory: Sensory testing is intact to soft touch on all 4 extremities. No evidence of extinction is noted.  Coordination: Cerebellar testing reveals good finger-nose-finger and heel-to-shin bilaterally.  Gait and station: Gait is stable with walker   DIAGNOSTIC DATA (LABS, IMAGING, TESTING) - I reviewed patient records, labs, notes, testing and imaging myself where available.  MMSE - Mini Mental State Exam 03/07/2019  Orientation to time 4  Orientation to Place 3  Registration 3  Attention/ Calculation 1  Recall 3  Language- name 2 objects 2  Language- repeat 1  Language- follow 3 step command 2  Language- read & follow direction 1  Write a sentence 0  Copy design 0  Total score 20     Lab Results  Component Value Date   WBC 10.5 10/16/2015   HGB 12.7 10/16/2015   HCT 37.5 10/16/2015   MCV 87.2 10/16/2015   PLT 465 (H) 10/16/2015      Component Value Date/Time   NA 132 (L) 10/16/2015 0506   K 4.5 10/16/2015 0506   CL 102 10/16/2015 0506   CO2 23 10/16/2015 0506   GLUCOSE 121 (H) 10/16/2015 0506   BUN 8 10/16/2015 0506   CREATININE 0.50 10/16/2015 0506   CALCIUM 8.8 (L) 10/16/2015 0506   PROT 5.9 (L) 10/05/2015 1853   ALBUMIN 2.8 (L) 10/05/2015 1853   AST 33 10/05/2015 1853   ALT 18 10/05/2015 1853   ALKPHOS 70 10/05/2015 1853   BILITOT 1.2 10/05/2015 1853   GFRNONAA >60 10/16/2015 0506   GFRAA >60 10/16/2015 0506   No results found for: CHOL, HDL, LDLCALC,  LDLDIRECT, TRIG, CHOLHDL No results found for: HGBA1C No results found for: VITAMINB12 Lab Results  Component Value Date   TSH 2.574 10/13/2015     ASSESSMENT AND PLAN 85 y.o. year old female  has a past medical history of Asymptomatic carotid artery stenosis, Bladder cancer ([facility]), Cervical cancer ([place]), Coronary artery disease, Dyslipidemia, Glaucoma, Hypertension, Hypertension, essential, benign, Moderate Right-sided carotid artery disease, and Parkinson's disease ([place]). here with     ICD-10-CM   1. Parkinson's disease ([place])  G20   2. Weakness  R53.1   3. Acute confusion  R41.0     Stacey Walker continues to do well with Sinemet as prescribed.  Unfortunately, she has had.  Over the last few weeks where she has been more acutely confused and weak.  She has been followed closely by primary care who did find a stool impaction.  Her daughter has been helping to treat this at home.  She is now having regular bowel movements.  She is  eating a little better and has gained a little weight.  Confusion seems to be somewhat better.  Weakness is better.  We have discussed daughter's concerns in the office.  I cannot completely rule out concerns of dementia, however, symptoms do seem to be improving slowly.  I have asked that she follow-up very closely with primary care to continue monitoring any acute metabolic or infectious processes.  She will follow-up with me or Dr. Ladonna Snide in 6 weeks for reevaluation.  We may consider adding memory medications in the future but do not feel that they are warranted at this time.  Her daughter verbalizes understanding and agreement with this plan.   No orders of the defined types were placed in this encounter.    No orders of the defined types were placed in this encounter.        Debbora Presto, FNP-C 03/07/2019, 4:13 PM [facility] [address] [place], [place] [address] [phone]

## 2019-03-13 DIAGNOSIS — R634 Abnormal weight loss: Secondary | ICD-10-CM | POA: Insufficient documentation

## 2019-03-15 ENCOUNTER — Telehealth: Payer: Self-pay | Admitting: Internal Medicine

## 2019-03-15 NOTE — Telephone Encounter (Signed)
Spoke with daughter [name] regarding scheduling the Palliative Consult and she requested that I call her back in an hour because they have an appointment scheduled shortly with the patient's MD, will call back in an hour.

## 2019-03-15 NOTE — Telephone Encounter (Signed)
Rec'd call back from daughter Romie Jumper regarding scheduling the Palliative Consult, she was in agreement with this.  I have scheduled an In-person Consult for 03/19/19 @ 3:30 PM.

## 2019-03-15 NOTE — Telephone Encounter (Signed)
1:15 PM:  Called daughter back to schedule Consult, no answer - left message with my contact information.

## 2019-03-19 ENCOUNTER — Encounter: Payer: Self-pay | Admitting: Internal Medicine

## 2019-03-19 ENCOUNTER — Other Ambulatory Visit: Payer: Medicare PPO | Admitting: Internal Medicine

## 2019-03-19 ENCOUNTER — Other Ambulatory Visit: Payer: Self-pay

## 2019-03-19 DIAGNOSIS — Z7189 Other specified counseling: Secondary | ICD-10-CM

## 2019-03-19 DIAGNOSIS — Z515 Encounter for palliative care: Secondary | ICD-10-CM

## 2019-03-19 NOTE — Progress Notes (Signed)
Jan 11th, 2020 [place] Note Telephone: [phone]  Fax: [phone]  PATIENT NAME: Stacey Walker DOB: Aug 08, 1925 MRN: [id number] [facility] [place],  [place], [place]  PRIMARY CARE PROVIDER:   Leanna Battles, MD Debbora Presto NP [facility] [phone]  REFERRING PROVIDER:  Leanna Battles, [place] [id number]  RESPONSIBLE PARTY: (dtr) [name] [phone] (works for the school system)  ASSESSMENT / RECOMMENDATIONS:  1. Advance Care Planning:  A. Directives: DNR form present within home (posted on fridge). Reviewed and verified with both patient and her HCPOA daughter [name]. We reviewed the sections of the MOST form. Form, along with patient information literature, left. Daughter wishes to review further and will call me to complete is she desires. B. Goals of Care: Keep as happy, safe, and healthy as possible.  2.Cognitive / Functional status: Family note gradual Increase in confusion and weakness over the last month. Specifically, forgetting what day of the week it is, taking showers without help, in past taking all of her QID medications all at once, or not at all. Patient is A & O, though seems depressed affect. Notes mild LEE and left lower facial tremors (so fine unable to appreciate visually). Patient mentions increased somnolence but sleeps because "there is nothing to do". She enjoys watching politics on TV but as of late has found this depressing. Independent in ADLs, though has an Environmental consultant to help with showers on Wednesdays. No fall. Ambulating with walker. Sinemet helps with gait. Electric wheelchair when she leaves her room. Chronic constipation managed with prn Miralax, daily senna and stool softeners. Chronic urinary urgency. Decreased appetite. Weight down a few lbs over the last 3 weeks. Currently 123 lbs. At a height of 5'2" her BMI  is 22.5kg/m2. Mild LE swelling.  3. Family Supports: widowed, one daughter Romie Jumper visits every evening, and son-in-law visits at noon to give noon medications. Daughter expresses concern with her mom's confusion and thus safety risk. Challenge to have patient take her QID medications on time. Worry that mom has taken a shower by herself in the past, as she was confused as to the day of the week. There is a person that comes in every Wednesday to assist patient with a shower and brings her to the beauty pallor. Daughter is attempting to find some more help; perhaps for a few hours a day to assist with medications and for socialization. Patient [facility] a [facility] at [facility]. She's developed some situational depression d/t social isolation. Recent addition of bupropion by PCP to address this, as well as to stimulate appetite.  4. Follow up Palliative Care Visit: Daughter will call on a prn basis, or if wishes to complete her MOST form.  I spent 60 minutes providing this consultation from 2:30-3:30pm. More than 50% of the time in this consultation was spent coordinating communication.   HISTORY OF PRESENT ILLNESS:  Stacey Walker is a 86 y.o. female with h/o Parkinson's disease, bladder cancer, cervical cancer, CAD, HLD, HTN.  Palliative Care was asked to help address goals of care.   CODE STATUS: DNR  PPS: 60%  HOSPICE ELIGIBILITY/DIAGNOSIS: no, as prognosis felt to be greater than 6 months.  PAST MEDICAL HISTORY:  Past Medical History:  Diagnosis Date  . Asymptomatic carotid artery stenosis    Dopplers January 2016: RICA 50-69% tortuous (no change; L ICA ~70%, tortuous (no change), bilateral subclavian  arteries <50%  . Bladder cancer ([place])   . Cervical cancer ([place])   . Coronary artery disease   . Dyslipidemia    Managed by PCP. On statin  . Glaucoma   . Hypertension   . Hypertension, essential, benign   . Moderate Right-sided carotid artery disease    50-70%  stenosis on the right carotid artery from 2011. Also noted less than 50% right and left subclavian artery stenosis.  . Parkinson's disease ([place])     SOCIAL HX:  Social History   Tobacco Use  . Smoking status: Former Smoker    Quit date: 03/08/1978    Years since quitting: 41.0  . Smokeless tobacco: Never Used  Substance Use Topics  . Alcohol use: Yes    Comment: occas    ALLERGIES: No Known Allergies   PERTINENT MEDICATIONS:  Outpatient Encounter Medications as of 03/19/2019  Medication Sig  . acetaminophen (TYLENOL) 325 MG tablet Take 650 mg by mouth every 6 (six) hours as needed for moderate pain.  Marland Kitchen aspirin EC 81 MG tablet Take 81 mg by mouth daily.  Marland Kitchen buPROPion (WELLBUTRIN) 75 MG tablet Take 75 mg by mouth daily.  . carbidopa-levodopa (SINEMET IR) 25-100 MG tablet Take 1 tablet by mouth 4 (four) times daily.  Marland Kitchen docusate sodium (COLACE) 100 MG capsule Take 100 mg by mouth at bedtime.  Marland Kitchen ezetimibe-simvastatin (VYTORIN) 10-40 MG tablet Take 1 tablet by mouth daily. NEED OV.  Marland Kitchen ipratropium (ATROVENT) 0.03 % nasal spray Place 1 spray into both nostrils 3 (three) times daily as needed.   Marland Kitchen LUMIGAN 0.01 % SOLN Place 1 drop into both eyes at bedtime.  . Multiple Vitamin (MULTIVITAMIN WITH MINERALS) TABS tablet Take 1 tablet by mouth daily.  . naproxen sodium (ALEVE) 220 MG tablet Take 220 mg by mouth.  Marland Kitchen OVER THE COUNTER MEDICATION Mega Red Krill Oil 350mg  qd  . OVER THE COUNTER MEDICATION AREDS 2 bid  . polyethylene glycol (MIRALAX / GLYCOLAX) packet Take 17 g by mouth 2 (two) times daily. (Patient taking differently: Take 17 g by mouth daily as needed. )  . senna-docusate (SENOKOT-S) 8.6-50 MG tablet Take 1 tablet by mouth daily.  . timolol (TIMOPTIC) 0.5 % ophthalmic solution INSTILL 1 DROP INTO EACH EYE IN THE MORNING  . valsartan (DIOVAN) 160 MG tablet Take 1 tablet by mouth daily.  Marland Kitchen lactulose (CEPHULAC) 10 g packet Take 10 g by mouth 3 (three) times daily.  . traZODone (DESYREL)  50 MG tablet TAKE 1 AND 1/2 TABLET (75 MG TOTAL) BY MOUTH AT BEDTIME AS NEEDED FOR SLEEP (Patient not taking: Reported on 03/07/2019)  . [DISCONTINUED] omega-3 acid ethyl esters (LOVAZA) 1 g capsule Take 1 g by mouth daily.  . [DISCONTINUED] triamterene-hydrochlorothiazide (MAXZIDE-25) 37.5-25 MG tablet Take 1 tablet by mouth daily.   No facility-administered encounter medications on file as of 03/19/2019.    PHYSICAL EXAM:   General: NAD, frail appearing, thin. Depressed affect. PE limited to decrease risk of COVID exposure Extremities: very mild LE edema, no joint deformities Skin: no rashes Neurological: Weakness but otherwise non-focal  Julianne Handler, NP

## 2019-03-22 ENCOUNTER — Encounter: Payer: Self-pay | Admitting: Internal Medicine

## 2019-03-22 ENCOUNTER — Other Ambulatory Visit: Payer: Medicare PPO | Admitting: Internal Medicine

## 2019-03-22 DIAGNOSIS — Z7189 Other specified counseling: Secondary | ICD-10-CM

## 2019-03-22 DIAGNOSIS — Z515 Encounter for palliative care: Secondary | ICD-10-CM

## 2019-03-22 NOTE — Progress Notes (Signed)
Jan 14th, 2020 [place] Note Telephone: [phone]  Fax: [phone]   PATIENT NAME: Stacey Walker DOB: 03-Jun-1925 MRN: [id number] [facility] [place], [place]   PRIMARY CARE PROVIDER:   Leanna Battles, MD Debbora Presto NP [facility] [phone]   REFERRING PROVIDER:  Leanna Battles, [place] [address]   RESPONSIBLE PARTY: (dtr) [name] [phone] (works for the school system)   ASSESSMENT / RECOMMENDATIONS:  1. Advance Care Planning:   A. Directives: DNR form present within home (posted on fridge), and up loaded into [facility] EMR/VYNCA. Patient's daughter [name] had reviewed the MOST form with her mom, and now wished to sign. Details as followed: DNR/DNI. Scope of Medical Interventions limited to Comfort Care. Use of Antibiotics and IVFs to be determined at the time of need. No to Tube Feedings. Form left with daughter in the home, and uploaded into [facility] VYNCA.    B. Goals of Care: Keep as happy, safe, and healthy as possible. Avoid hospitalizations. Would like referral to Hospice if meets eligibility criteria.    2.Cognitive / Functional status: Daughter [name] notes patient with progressive decline over the last 3 days, so that she is now unable to self-transfer, and needs help with her ADLs. She continues very forgetful with increased somnolence and confusion. Facility called 911 last eve d/t increased patient weakness and inability to transfer. EMS evaluation without acute problems. [name] notes patient ate only  of a sandwich yesterday, and just bites today. Patient's weight today was 111lbs, which reflects a loss of 12 lbs (13% of body weight) over the last 2 weeks. At a height of 5'2" her BMI is 20.3kg/m2. [name] consulted patient's PCP; Welbutrin order to be held. Will consider addition of Remeron.   3. Family Supports: (from prior  note) widowed, one daughter Romie Jumper visits every evening, and son-in-law visits at noon to give noon medications. Daughter expresses concern with her mom's confusion and thus safety risk. Challenge to have patient take her QID medications on time. Worry that mom has taken a shower by herself in the past, as she was confused as to the day of the week. There is a person that comes in every Wednesday to assist patient with a shower and brings her to the beauty pallor. Daughter is attempting to find some more help; perhaps for a few hours a day to assist with medications and for socialization. Patient has been a resident at [facility] for [facility] years. Daughter is looking for aide/sitter assistance for night time coverage.   4. Follow up Palliative Care Visit: Daughter will call me next week if patient continues to show signs of decline; possible hospice evaluation.   I spent 60 minutes providing this consultation from 2:30-3:30pm. More than 50% of the time in this consultation was spent coordinating communication.    HISTORY OF PRESENT ILLNESS:  Stacey Walker is a 86 y.o. female with h/o possible lung cancer (seen on Chest CT Aug 2017; no w/u), Parkinson's disease, bladder cancer, cervical cancer, CAD, HLD, HTN.   This is a f/u Palliative Care visit from Jan 11th at the request of patient's daughter, to complete MOST form, and to assess for further decline.     CODE STATUS: DNR   PPS: 30% (from 60% few days earlier)   HOSPICE ELIGIBILITY/DIAGNOSIS: yes/  Dx: functional decline with known RUL enlarging lung nodule concerning for a slow growing malignancy (possible  adenocarcinoma; measured 2.1 x 1.1 cm) seen on chest CT Aug 2017 (enlarging and more solid c/w CT scan 16 months earlier).  PAST MEDICAL HISTORY:  Past Medical History:  Diagnosis Date  . Asymptomatic carotid artery stenosis    Dopplers January 2016: RICA 50-69% tortuous (no change; L ICA ~70%, tortuous (no change), bilateral  subclavian arteries <50%  . Bladder cancer ([place])   . Cervical cancer ([place])   . Coronary artery disease   . Dyslipidemia    Managed by PCP. On statin  . Glaucoma   . Hypertension   . Hypertension, essential, benign   . Moderate Right-sided carotid artery disease    50-70% stenosis on the right carotid artery from 2011. Also noted less than 50% right and left subclavian artery stenosis.  . Parkinson's disease ([place])     SOCIAL HX:  Social History   Tobacco Use  . Smoking status: Former Smoker    Quit date: 03/08/1978    Years since quitting: 41.0  . Smokeless tobacco: Never Used  Substance Use Topics  . Alcohol use: Yes    Comment: occas    ALLERGIES: No Known Allergies   PERTINENT MEDICATIONS:  Outpatient Encounter Medications as of 03/22/2019  Medication Sig  . acetaminophen (TYLENOL) 325 MG tablet Take 650 mg by mouth every 6 (six) hours as needed for moderate pain.  Marland Kitchen aspirin EC 81 MG tablet Take 81 mg by mouth daily.  Marland Kitchen buPROPion (WELLBUTRIN) 75 MG tablet Take 75 mg by mouth daily.  . carbidopa-levodopa (SINEMET IR) 25-100 MG tablet Take 1 tablet by mouth 4 (four) times daily.  Marland Kitchen docusate sodium (COLACE) 100 MG capsule Take 100 mg by mouth at bedtime.  Marland Kitchen ezetimibe-simvastatin (VYTORIN) 10-40 MG tablet Take 1 tablet by mouth daily. NEED OV.  Marland Kitchen ipratropium (ATROVENT) 0.03 % nasal spray Place 1 spray into both nostrils 3 (three) times daily as needed.   . lactulose (CEPHULAC) 10 g packet Take 10 g by mouth 3 (three) times daily.  Marland Kitchen LUMIGAN 0.01 % SOLN Place 1 drop into both eyes at bedtime.  . Multiple Vitamin (MULTIVITAMIN WITH MINERALS) TABS tablet Take 1 tablet by mouth daily.  . naproxen sodium (ALEVE) 220 MG tablet Take 220 mg by mouth.  Marland Kitchen OVER THE COUNTER MEDICATION Mega Red Krill Oil 350mg  qd  . OVER THE COUNTER MEDICATION AREDS 2 bid  . polyethylene glycol (MIRALAX / GLYCOLAX) packet Take 17 g by mouth 2 (two) times daily. (Patient taking differently: Take 17 g by mouth  daily as needed. )  . senna-docusate (SENOKOT-S) 8.6-50 MG tablet Take 1 tablet by mouth daily.  . timolol (TIMOPTIC) 0.5 % ophthalmic solution INSTILL 1 DROP INTO EACH EYE IN THE MORNING  . traZODone (DESYREL) 50 MG tablet TAKE 1 AND 1/2 TABLET (75 MG TOTAL) BY MOUTH AT BEDTIME AS NEEDED FOR SLEEP (Patient not taking: Reported on 03/07/2019)  . valsartan (DIOVAN) 160 MG tablet Take 1 tablet by mouth daily.   No facility-administered encounter medications on file as of 03/22/2019.    PHYSICAL EXAM:   VS: (by daughter) BP 131/62,  T 98.6  General: NAD, frail appearing, thin. Speaking only a few words. Awake and alert, but fatigued.  PE limited to decrease risk of COVID exposure Extremities: very mild LE edema, no joint deformities Skin: no rashes Neurological: Weakness but otherwise non-focal  Julianne Handler, NP

## 2019-03-23 ENCOUNTER — Other Ambulatory Visit: Payer: Self-pay

## 2019-03-25 ENCOUNTER — Telehealth (HOSPITAL_COMMUNITY): Payer: Self-pay | Admitting: Neurology

## 2019-03-25 NOTE — Telephone Encounter (Signed)
I returned call from patient's daughter stated that for the last couple of months she has noticed worsening gait and the patient with feet look to the ground and short steps generalized weakness.  She has been on Sinemet 25/101 tablet 4 times daily for many years and she was wondering if she can try the increased dose.  I recommend she increase the dose of Sinemet to 1 tablet in the morning 1 and half in the afternoon 1 in the evening and 1-1/2 at night for a week if she tolerates that she can increase to 1-1/2 tablet 4 times daily.  We discussed possible side effects including sleepiness, dizziness, nightmares, upset stomach and she was advised to call back next week to let Dr. Mariea Stable know how she was doing.  She voiced understanding.

## 2019-03-28 ENCOUNTER — Telehealth: Payer: Self-pay | Admitting: Family Medicine

## 2019-03-28 ENCOUNTER — Other Ambulatory Visit: Payer: Self-pay | Admitting: Neurology

## 2019-03-28 ENCOUNTER — Ambulatory Visit: Payer: Medicare Other

## 2019-03-28 MED ORDER — CARBIDOPA-LEVODOPA 25-100 MG PO TABS: 1.5000 | ORAL_TABLET | Freq: Four times a day (QID) | ORAL | 0 refills | Status: DC

## 2019-03-28 NOTE — Telephone Encounter (Signed)
King,[name](daughter on DPR)has called for pt's last appointment date.  Daughter also asked that it be noted that pt has greatly declined since last seen by Amy, NP.  Daughter has not asked for a call back to discuss, she just wanted it documented.

## 2019-03-28 NOTE — Addendum Note (Signed)
Addended by: Brandon Melnick on: 03/28/2019 04:35 PM   Modules accepted: Orders

## 2019-03-28 NOTE — Telephone Encounter (Signed)
I called daughter, they have hospice/ pallative care coming in to home.  Over the weekend she contacted our on call MD, for weakness, collapsed.  He increased her sinemet to 1.5tabs po qid. She I not able to walk.  Dementia has worsened.  She does not feel that she can make to appt.. so will cancel.  Se needs refill on her sinemet.

## 2019-03-28 NOTE — Telephone Encounter (Signed)
Yes please make sure they are aware of upcoming appt with Dr Felecia Shelling on 2/2. Dr Leonie Man recommended increasing Sinemet to 1.5 tablets and I see that Pallative care was consulted. Please have her call if she needs [place].

## 2019-04-10 ENCOUNTER — Ambulatory Visit: Payer: Medicare Other | Admitting: Neurology

## 2019-05-25 ENCOUNTER — Other Ambulatory Visit: Payer: Self-pay

## 2019-05-25 ENCOUNTER — Non-Acute Institutional Stay: Payer: Medicare PPO | Admitting: Internal Medicine

## 2019-05-25 ENCOUNTER — Telehealth: Payer: Self-pay | Admitting: Family Medicine

## 2019-05-25 DIAGNOSIS — Z7189 Other specified counseling: Secondary | ICD-10-CM

## 2019-05-25 DIAGNOSIS — Z515 Encounter for palliative care: Secondary | ICD-10-CM

## 2019-05-25 NOTE — Telephone Encounter (Signed)
Pt's daughter Romie Jumper on [place] called wanting to discuss with RN the increase of daytime sleepiness and anxiety the pt is having. Please call [phone] number if before 3pm and cell number [phone] if after 3.

## 2019-05-27 ENCOUNTER — Encounter: Payer: Self-pay | Admitting: Internal Medicine

## 2019-05-27 NOTE — Progress Notes (Signed)
March 19th, 2020 Stacey Walker Note Telephone: [phone]  Fax: [phone]   PATIENT NAME: Stacey Walker DOB: July 06, 1925 MRN: [id number] [facility] [address]   PRIMARY CARE PROVIDER:   Leanna Battles, MD Debbora Presto NP [facility] [phone]   REFERRING PROVIDER:  Leanna Battles, [place] [address]   RESPONSIBLE PARTY: (dtr) Stacey Walker [phone] (works for the school system)  ASSESSMENT / RECOMMENDATIONS:  1. Advance Care Planning:              A. Directives: DNR. MOST: DNR/DNI. Scope of Medical Interventions limited to Comfort Care. Use of Antibiotics and IVFs to be determined at the time of need. No to Tube Feedings.               B. Goals of Care: Keep as happy, safe, and healthy as possible. Avoid hospitalizations. Would like re-referral to Hospice if patient doesn't progress with PT    2.Cognitive / Functional status: Patient is A & O x 3. She is able to motor about in her electric scooter. Patient able to self-transfer but is very frightened of falling, so asks for help and 1 person assist. She has progressive LE weakness. She needs help with her ADLs including dressing, hygiene, and toileting. She is very forgetful with increased somnolence. Daughter reports patient sleeps much of the day with long naps, but up a bit at night. She consumes less than 20% of her meals and can feed herself. She has difficulty with early satiety, and decreased taste/smell. She is continent of bowel and bladder. The last weight I have on her was 3 months ago, when she was 111 lb. At a of 5'2" her BMI at that time was 20.3kg/m2.    3. Coping/Family Supports: Patient recently transferred to [place], from [place] (was there for 11 years). Three months earlier patient was on hospice services, but recently revoked those services to pursue physical therapy (to start next week).    Patient has been experiencing a difficult initial transition to [place]. She has been calling staff frequently and is viewed as being demanding. Her personality can be abrasive, but historically gets along well with caregivers as they get to know each other. Daughter Stacey Walker believes that when patient is up at night, she gets overtired/irritable/agitated/restless/anxious. Has been managed with prn nocturnal Ativan (averaged 2/week) to good effect. Patient is transitioning from having had 24/7 personal caregivers, so she is used to having her needs met immediately. Currently Daughter, who is an Therapist, sports, come in to care for patient in the evening, and a private pay aide works 4d/wk.   Patient presents with a somewhat depressed affect. I don't have a copy of her current MAR, but daughter reports patient on Depakote 166m bid. Some of patient's behaviors may be d/t progression of her Parkinson's disease.  SManuela Schwartzrequests: - patient to receive Ens[facility] with her medications ([facility]  - simplify patient's medication regimen so that she receives her medications on a qid schedule; previously on a 7am, noon, 5:30pm, and 10pm schedule (currently on a higher frequency schedule) - ensure that patient's cell phone and water are reachable from her bed (could place a sign on the wall to remind staff), -request facility's psych NP to consult regarding medication adjustment to address depression/anxiety/insomnia, -ask that staff call SManuela Schwartzanytime if patient refusing to take her Ativan; SManuela Schwartzbelieves she can convince her to take.  Could  change Depakote from 125bid to 250 qhs, to help cover nocturnal agitation/insomnia; discontinue am dose as patient is hyper somnolent during the day. Offer prn Ativan during the night.    4. Follow up Palliative Care Visit: next week.   I spent 60 minutes providing this consultation from 4:30-5:30pm. More than 50% of the time in this consultation was spent coordinating  communication.    HISTORY OF PRESENT ILLNESS:  Stacey Walker is a 86 y.o. female with h/o possible lung cancer (seen on Chest CT Aug 2017; no w/u), Parkinson's disease, bladder cancer, cervical cancer, CAD, HLD, HTN.   This is a f/u Palliative Care visit from Jan 14th.      CODE STATUS: DNR   PPS: 40%   HOSPICE ELIGIBILITY/DIAGNOSIS: yes, pending discontinuation of PT services. Hospice diagnosis: functional decline with known RUL enlarging lung nodule concerning for a slow growing malignancy (possible adenocarcinoma; measured 2.1 x 1.1 cm) seen on chest CT Aug 2017 (enlarging and more solid c/w CT scan 16 months earlier). Progression weakness d/t Parkinson's dz. Failure to Thrive.  PAST MEDICAL HISTORY:  Past Medical History:  Diagnosis Date  . Asymptomatic carotid artery stenosis    Dopplers January 2016: RICA 50-69% tortuous (no change; L ICA ~70%, tortuous (no change), bilateral subclavian arteries <50%  . Bladder cancer ([place])   . Cervical cancer ([place])   . Coronary artery disease   . Dyslipidemia    Managed by PCP. On statin  . Glaucoma   . Hypertension   . Hypertension, essential, benign   . Moderate Right-sided carotid artery disease    50-70% stenosis on the right carotid artery from 2011. Also noted less than 50% right and left subclavian artery stenosis.  . Parkinson's disease ([place])     SOCIAL HX:  Social History   Tobacco Use  . Smoking status: Former Smoker    Quit date: 03/08/1978    Years since quitting: 41.2  . Smokeless tobacco: Never Used  Substance Use Topics  . Alcohol use: Yes    Comment: occas    ALLERGIES: No Known Allergies   PERTINENT MEDICATIONS:  Outpatient Encounter Medications as of 05/25/2019  Medication Sig  . acetaminophen (TYLENOL) 325 MG tablet Take 650 mg by mouth every 6 (six) hours as needed for moderate pain.  Marland Kitchen aspirin EC 81 MG tablet Take 81 mg by mouth daily.  Marland Kitchen buPROPion (WELLBUTRIN) 75 MG tablet Take 75 mg by mouth daily.  .  carbidopa-levodopa (SINEMET IR) 25-100 MG tablet Take 1.5 tablets by mouth 4 (four) times daily.  Marland Kitchen docusate sodium (COLACE) 100 MG capsule Take 100 mg by mouth at bedtime.  Marland Kitchen ezetimibe-simvastatin (VYTORIN) 10-40 MG tablet Take 1 tablet by mouth daily. NEED OV.  Marland Kitchen ipratropium (ATROVENT) 0.03 % nasal spray Place 1 spray into both nostrils 3 (three) times daily as needed.   . lactulose (CEPHULAC) 10 g packet Take 10 g by mouth 3 (three) times daily.  Marland Kitchen LUMIGAN 0.01 % SOLN Place 1 drop into both eyes at bedtime.  . Multiple Vitamin (MULTIVITAMIN WITH MINERALS) TABS tablet Take 1 tablet by mouth daily.  . naproxen sodium (ALEVE) 220 MG tablet Take 220 mg by mouth.  Marland Kitchen OVER THE COUNTER MEDICATION Mega Red Krill Oil 330m qd  . OVER THE COUNTER MEDICATION AREDS 2 bid  . polyethylene glycol (MIRALAX / GLYCOLAX) packet Take 17 g by mouth 2 (two) times daily. (Patient taking differently: Take 17 g by mouth daily as needed. )  . senna-docusate (  SENOKOT-S) 8.6-50 MG tablet Take 1 tablet by mouth daily.  . timolol (TIMOPTIC) 0.5 % ophthalmic solution INSTILL 1 DROP INTO EACH EYE IN THE MORNING  . traZODone (DESYREL) 50 MG tablet TAKE 1 AND 1/2 TABLET (75 MG TOTAL) BY MOUTH AT BEDTIME AS NEEDED FOR SLEEP (Patient not taking: Reported on 03/07/2019)  . valsartan (DIOVAN) 160 MG tablet Take 1 tablet by mouth daily.   No facility-administered encounter medications on file as of 05/25/2019.    PHYSICAL EXAM:   VS: (by daughter) BP 131/62,  T 98.6  General: NAD, frail appearing, thin. Lying on her right side in hospital bed, sleeping. Awoke easily to voice. She remembered me from seeing her 2 months earlier. She is pleasantly conversant, appears fatigued.  PE limited to decrease risk of COVID exposure Extremities: no LE edema, no joint deformities Skin: no rashes Neurological: Weakness but otherwise non-focal  Julianne Handler, NP

## 2019-05-28 MED ORDER — CARBIDOPA-LEVODOPA 25-100 MG PO TABS: ORAL_TABLET | ORAL | 1 refills | Status: DC

## 2019-05-28 NOTE — Telephone Encounter (Signed)
Fax confirmation received. 

## 2019-05-28 NOTE — Telephone Encounter (Signed)
I called pts daughter.  Pt has moved to [place]. [phone] last Wednesday.  Is being given sinemet QID at 8-12-4-8, this has not worked for her, is trying to get her back to every 5-6 hours when given at home with meals, ensure,.  She seemed to due better.  Needs order for this to be done, (7-12-5-10p).  Also has issues with daytime sleepiness, and anxiety (when awakesn at 3-4AM).  DrSharlett Iles, pcp prescribing depakote 125mg  po bid (she will defer to him about this).  Ok to change order for sinemet? Please advise.  This will print and go to [facility]. Thanks.  She is no longer on hospice.  (but palliative).    ? Need appt with you or Dr. Felecia Shelling.

## 2019-05-28 NOTE — Telephone Encounter (Signed)
I called daughter of pt.  I relayed that will send new prescription for the time changes (similar to what she was taking at home with ensure) to [facility] [phone].Made 6 mo f/u with Dr. Felecia Shelling 09-05-19 at 0830.  She verbalized understanding.  Will monitor sleepiness and if continues will call back.

## 2019-05-28 NOTE — Telephone Encounter (Signed)
Ok. I know that she was taking Sinemet around 7am, noon, 5:30pm, and 10pm. Usually this medication works better when taken on an empty stomach. Unfortunately, it can cause sleepiness as well which I know has been an issue. I am ok with her continuing home schedule of 7am, 12p, 5:30p and 10p if patient feels this works better. Please continue close follow up with PCP. I know they had follow up scheduled with Dr Felecia Shelling but I do not see that the visit took place. Is she able to reschedule?

## 2019-05-29 NOTE — Addendum Note (Signed)
Addended by: Violeta Gelinas on: 05/29/2019 10:45 PM   Modules accepted: Orders

## 2019-06-06 ENCOUNTER — Other Ambulatory Visit: Payer: Self-pay | Admitting: Neurology

## 2019-06-18 ENCOUNTER — Other Ambulatory Visit: Payer: Medicare PPO | Admitting: Internal Medicine

## 2019-06-18 ENCOUNTER — Other Ambulatory Visit: Payer: Self-pay

## 2019-06-18 ENCOUNTER — Encounter: Payer: Self-pay | Admitting: Internal Medicine

## 2019-06-18 DIAGNOSIS — Z7189 Other specified counseling: Secondary | ICD-10-CM | POA: Diagnosis not present

## 2019-06-18 DIAGNOSIS — Z515 Encounter for palliative care: Secondary | ICD-10-CM

## 2019-06-18 NOTE — Progress Notes (Addendum)
April 12th, 2021 [place] Note Telephone: [phone]  Fax: [phone]   PATIENT NAME: Stacey Walker DOB: 1925-12-28 MRN: [id number] [address] [place] [id number]   PRIMARY CARE PROVIDER:   Leanna Battles, MD Stacey Presto NP [facility] [phone]   REFERRING PROVIDER:  Leanna Walker, [place] [id number]   RESPONSIBLE PARTY: (dtr) Stacey Walker [phone] (works for the school system)   ASSESSMENT / RECOMMENDATIONS:  1. Advance Care Planning:              A. Directives: DNR. MOST: DNR/DNI. Scope of Medical Interventions limited to Comfort Care. Use of Antibiotics and IVFs to be determined at the time of need. No to Tube Feedings.               B. Goals of Care: Keep as happy, safe, and healthy as possible. Avoid hospitalizations. Would like re-referral to Hospice as approaches end of life.    2.Cognitive / Functional status: Patient is A & O x 3. She can be forgetful. Daughter reports patient takes a long nap mid-morning, but up a bit at night.  She can no longer motor about in her electric scooter. She now needs 1-person heavy assist to transfer and is no longer ambulating. She has progressed in her LE weakness. She is dependent for ADLs including dressing, hygiene, and toileting.   She consumes less than 20% of her meals. She can feed herself. She has a poor appetite and decreased taste/smell. Some swallowing difficulties thought d/t Parkinson's disease. She is continent of bowel and bladder. The last weight I have on her was 3 months ago, when she was 111 lb. At a of 5'2" her BMI at that time was 20.3kg/m2. She appears to have lost weight since then, with extremity and facial wasting.   3. Symptom Management:  Nocturnal and last afternoon feelings of urinary urgency, which seem to occur 4 hours after taking her Sinemet dose. During those times, she may try to urinate 4  times in 1 hour, with This has been a chronic problem. She's been tested for UTIs with negative results. Currently takes Sinemet doses 1.5 tabs at 6:30am, noon, 5pm, and 10pm.  -daughter to trial taking Sinemet 1 tab q4h, to see if that has any impact on her urination urgencies.   -trial Aleve qhs; if nocturnal symptoms of urinary urgency d/t cystitis, may improve with NSAIDS.  Late evening nausea, possibly r/t 5pm dose probiotic and/or Vytorin.  -trial off these meds; monitor if improvement/resolution of nausea.    3. Coping/Family Supports: Patient recently transferred from [place] (staying there only a few weeks) to her daughter's home. Previously, lived in [place] (was there for 11 years). Four months earlier patient was on hospice services but revoked those services to pursue physical therapy (to start next week).    Living in her daughter's home has been working out well. Daughter has in place private paid aides who work M-Sat 7a-5p; covers daughter's work week. Daughter has interrupted sleep d/t patient getting up as many os 4-6 times a night attempting to urinate (usually without results.  -Discussed having patient wear a depends pull up at night and urinate in diaper rather than get up and interrupt daughter's sleep. Patient is willing to trial this.  4. Follow up Palliative Care Visit: next week.   I spent 60 minutes providing this consultation from 4:30-5:30pm. More than 50% of the  time in this consultation was spent coordinating communication.    HISTORY OF PRESENT ILLNESS:  Stacey Walker is a 86 y.o. female with h/o possible lung cancer (seen on Chest CT Aug 2017; no w/u), Parkinson's disease, bladder cancer, cervical cancer, CAD, HLD, HTN.   This is a f/u Palliative Care visit from 05/25/2018.      CODE STATUS: DNR   PPS: 40%   HOSPICE ELIGIBILITY/DIAGNOSIS: yes, pending discontinuation of PT services. Hospice diagnosis: functional decline with known RUL  enlarging lung nodule concerning for a slow growing malignancy (possible adenocarcinoma; measured 2.1 x 1.1 cm) seen on chest CT Aug 2017 (enlarging and more solid c/w CT scan 16 months earlier). Progression weakness d/t Parkinson's dz. Failure to Thrive  PAST MEDICAL HISTORY:  Past Medical History:  Diagnosis Date  . Asymptomatic carotid artery stenosis    Dopplers January 2016: RICA 50-69% tortuous (no change; L ICA ~70%, tortuous (no change), bilateral subclavian arteries <50%  . Bladder cancer ([place])   . Cervical cancer ([place])   . Coronary artery disease   . Dyslipidemia    Managed by PCP. On statin  . Glaucoma   . Hypertension   . Hypertension, essential, benign   . Moderate Right-sided carotid artery disease    50-70% stenosis on the right carotid artery from 2011. Also noted less than 50% right and left subclavian artery stenosis.  . Parkinson's disease ([place])     SOCIAL HX:  Social History   Tobacco Use  . Smoking status: Former Smoker    Quit date: 03/08/1978    Years since quitting: 41.3  . Smokeless tobacco: Never Used  Substance Use Topics  . Alcohol use: Yes    Comment: occas    ALLERGIES: No Known Allergies   PERTINENT MEDICATIONS:  Outpatient Encounter Medications as of 06/18/2019  Medication Sig  . acetaminophen (TYLENOL) 325 MG tablet Take 650 mg by mouth every 6 (six) hours as needed for moderate pain.  . carbidopa-levodopa (SINEMET IR) 25-100 MG tablet TAKE 1 AND 1/2 TABLET BY MOUTH FOUR TIMES A DAY  . divalproex (DEPAKOTE SPRINKLE) 125 MG capsule Take 125 mg by mouth 2 (two) times daily.  Marland Kitchen docusate sodium (COLACE) 100 MG capsule Take 100 mg by mouth at bedtime.  Marland Kitchen ezetimibe-simvastatin (VYTORIN) 10-40 MG tablet Take 1 tablet by mouth daily. NEED OV.  Marland Kitchen ipratropium (ATROVENT) 0.03 % nasal spray Place 2 sprays into both nostrils 3 (three) times daily as needed.   . lactulose (CEPHULAC) 10 g packet Take 10 g by mouth 3 (three) times daily.  Marland Kitchen LORazepam (ATIVAN)  0.5 MG tablet Take 0.25 mg by mouth every 6 (six) hours as needed for anxiety.  Marland Kitchen LUMIGAN 0.01 % SOLN Place 1 drop into both eyes at bedtime.  . Multiple Vitamin (MULTIVITAMIN WITH MINERALS) TABS tablet Take 1 tablet by mouth daily.  . naproxen sodium (ALEVE) 220 MG tablet Take 220 mg by mouth.  . Nutritional Supplements (NUTRITIONAL DRINK) LIQD Take by mouth. Ensure twice daily  . ondansetron (ZOFRAN) 8 MG tablet Take 8 mg by mouth every 8 (eight) hours as needed for nausea or vomiting.  Marland Kitchen OVER THE COUNTER MEDICATION Mega Red Krill Oil 350mg  qd  . OVER THE COUNTER MEDICATION AREDS 2 bid  . polyethylene glycol (MIRALAX / GLYCOLAX) packet Take 17 g by mouth 2 (two) times daily. (Patient taking differently: Take 17 g by mouth daily as needed. )  . Probiotic Product ([place]) CAPS Take by mouth.  Marland Kitchen  senna-docusate (SENOKOT-S) 8.6-50 MG tablet Take 1 tablet by mouth daily.  . timolol (TIMOPTIC) 0.5 % ophthalmic solution INSTILL 1 DROP INTO EACH EYE IN THE MORNING  . traZODone (DESYREL) 50 MG tablet TAKE 1 AND 1/2 TABLET (75 MG TOTAL) BY MOUTH AT BEDTIME AS NEEDED FOR SLEEP  . valsartan (DIOVAN) 160 MG tablet Take 1 tablet by mouth daily.   No facility-administered encounter medications on file as of 06/18/2019.    PHYSICAL EXAM:   General: NAD, frail appearing, thin, fflat affect (of Parkinson's) Cardiovascular: regular rate and rhythm Pulmonary: clear ant fields, L base insp crackles. Abdomen: soft, nontender, + bowel sounds GU: no suprapubic tenderness Extremities: very mild L pedal edema (chronic), no joint deformities. Bilateral UE low frequency high amplitude tremor Skin: no rashes Neurological: generalized weakness but otherwise nonfocal  Julianne Handler, NP

## 2019-07-02 ENCOUNTER — Telehealth: Payer: Self-pay | Admitting: Neurology

## 2019-07-02 NOTE — Telephone Encounter (Signed)
Pts daughter requesting a call to discuss carbidopa-levodopa (SINEMET IR) 25-100 MG tablet didn't not wish to discuss further. Please contact after 3

## 2019-07-03 NOTE — Telephone Encounter (Signed)
It sounds like she was unable to tolerate increased Sinemet dosing. She was scheduled to return to see Dr Felecia Shelling in February but I do not see that this occurred. I would suggest she come back in for an evaluation. It would be great if Dr Felecia Shelling could see her for worsening symptoms and inability to tolerate increased medication dosing, however, if PT is not an option for them I am not certain what other options would be considered. There are a couple of other medications but all have sedative side effects. I think she needs to be seen to be able to best evaluate. She could also follow up with PCP for palliative care recommendations (comfort care) if they would prefer.

## 2019-07-03 NOTE — Telephone Encounter (Signed)
I called daughter back, wk [phone] till 3p,  Is asking about any other medications that may assist pt in her weakness/strength.  Taking now sinement 1 tablet po q 4 hours. Ithis from 1.5tabs qid.  (as was up in early hours of morning and increased frequency).  fhaving increased weakness, fatigue, takes only 1-2 steps to lift chair, BSD commode.  Is now at daughters home with caregiver.  When came to her home from [place], whe used walker this has stopped. Did not think therapy would be a good option as pt would be more upset.   Please advise. Palliative care sees pt once month. Last seen 12/20, to see Dr. Felecia Shelling in 08/2018.

## 2019-07-03 NOTE — Telephone Encounter (Signed)
I called and LMVM for daughter.  Relating that best option to evaluate her with Dr. Felecia Shelling or if comfort care, palliative care for her to be followed by pcp (whos she already see once monthly).  Other med options cause sedation.  appt with Dr. Felecia Shelling best to address.

## 2019-07-03 NOTE — Telephone Encounter (Signed)
I called daughter, made mychart VV for 1530.  Sent her email link to set up mychart.  She verbalized understanding.  (may need mycahrt support # [phone]).

## 2019-07-03 NOTE — Telephone Encounter (Signed)
I spoke to daughter, and she is not going to take her mom to any more appointments it is too hard.  She wants to do a afternoon appt virtual.

## 2019-07-04 ENCOUNTER — Telehealth (INDEPENDENT_AMBULATORY_CARE_PROVIDER_SITE_OTHER): Payer: Medicare PPO | Admitting: Neurology

## 2019-07-04 ENCOUNTER — Encounter: Payer: Self-pay | Admitting: Neurology

## 2019-07-04 DIAGNOSIS — N3941 Urge incontinence: Secondary | ICD-10-CM | POA: Diagnosis not present

## 2019-07-04 DIAGNOSIS — K5909 Other constipation: Secondary | ICD-10-CM

## 2019-07-04 DIAGNOSIS — G20 Parkinson's disease: Secondary | ICD-10-CM

## 2019-07-04 DIAGNOSIS — R269 Unspecified abnormalities of gait and mobility: Secondary | ICD-10-CM | POA: Diagnosis not present

## 2019-07-04 MED ORDER — CARBIDOPA-LEVODOPA 25-100 MG PO TABS: ORAL_TABLET | ORAL | 11 refills | Status: DC

## 2019-07-04 MED ORDER — RASAGILINE MESYLATE 0.5 MG PO TABS: 0.5000 mg | ORAL_TABLET | Freq: Every day | ORAL | 11 refills | Status: DC

## 2019-07-04 NOTE — Progress Notes (Signed)
[facility]  PATIENT: Stacey Walker DOB: Sep 13, 1925  REFERRING DOCTOR OR PCP:  Leanna Battles SOURCE: patient, daughter, EMR records, CT images in PACS  _________________________________   HISTORICAL  CHIEF COMPLAINT:  No chief complaint on file.  HISTORY OF PRESENT ILLNESS:  [name] is an 86 year old right-handed woman with Parkinson's disease   Update 07/04/2019: Virtual Visit via Video Note I connected with Saundra Shelling and daughter, Romie Jumper, on 07/04/19 at  3:30 PM EDT by a video enabled telemedicine application and verified that I am speaking with the correct person.  I discussed the limitations of evaluation and management by telemedicine and the availability of in person appointments. The patient expressed understanding and agreed to proceed.  Her daughter notes a further decline.   Her Sinemet has been increased to 1.5 pills qid and now changed to one pill q4 hours.    She still has a very short stride.  She tried PT but no benefit was noted.   She was using her walker better a few months ago.   She is unable to transfer independently though she does assist.   These changes in ability to stand up have worsened over the last few months  She is back to living with her daughter.   Assisted living was not adequate and agitation and medical issues worsened. .   Palliative care is also involved.    She eats poorly some days and the daughter needs to push food and drink.     She is having a lot of urinary frequency.  This is a little better with one pill po q4 hours compared to to 1.5 pills q 6 hours.  She has had only rare incontinence.  She also has constipation.  She is on Pericolace, senna and Miralax.   No recent impaction but she had a couple times while in assisted living.  She is eating poorly.    Weight is down to 110 from 125 last year.   She gets nausea and occasionally takes Zofran.    She sleeps well at night but also takes naps.  She sleeps  16 hours many days.   Trazodone was discontinued.   She has mild cognitive issues but STM is only mildly reduced.    No hallucinations.  She is on Depakote for agitation    Assessment and Plan: Parkinson's disease ([place])  Chronic constipation  Gait disorder  Urge incontinence  1.   Sinemet 25/100 every 4 hours up to 6 pills a day. 2 .. Add rasagiline 0.5 mg and consider increase to 1 mg based on response and side effects.  If dyskinesia or psychosis occurs either discontinue or reduce Sinemet 3.   Long discussion with her daughter about symptoms.  Due to her age and medical issues, we will hold off on checking a cervical spine MRI to further evaluate the gait issues as she would not proceed with neurosurgery if severe spinal stenosis/myelopathy is noted. 4.    Return in 6 months or sooner if there are new or worsening neurologic symptoms.  Follow Up Instructions: I discussed the assessment and treatment plan with the patient. The patient was provided an opportunity to ask questions and all were answered. The patient agreed with the plan and demonstrated an understanding of the instructions.    The patient was advised to call back or seek an in-person evaluation if the symptoms worsen or if the condition fails to improve as anticipated.  I provided 30  minutes of non-face-to-face time during this encounter.   Update 01/04/2019: She denies any new neuro symptom.   She uses a walker for short distances (usually uses in room only but could go 50-100 feet).   She uses an IT trainer wheelchair when she leaves her room.     She denies tremors.   However, handwriting is a little worse.     She has urinary incontinence (old) and rare constipation.  She takes colace daily and Miralax as needed with benefit.   She takes dried fruit daily.   She has trouble swallowing pills now.     She denies major cognitive issues but feels processing is reduced.  She had trouble learning how to use the TV remote.    She reports a mild dysesthesia on both hands, like sand rubbing against them.   She denies any weakness in the hands.    Update 10/07/2017: Her gait is poor and she needs to use her walker.   She takes Sinemet 4 times a day and feels it helps the gait.  She has not had any falls.  She is able to walk about 100 feet and possibly more with her walker.  She feels that the length of her stride improved when she went on Sinemet.  Her handwriting is poor and she rarely writes now.   No tremors.   Her bowel issues are doing better and constipation is milder now.    She takes Miralax if any constipation.   She has urinary urgency, unchanged.     She reports a mild dysesthesia on both hands, like sand rubbing against them.   She denies any weakness in the hands.  She also has some wrist pain.  She noes blurry vision but has gluacoma and macular degeneration.      From 06/09/2016: She presented with a poor gait and reduced ability to walk in 2016 following a hospital stay for aspiration pneumonia.. She improved after a few months but then worsened again there was also a mild tremor. Sinemet was started and she had significant improvement in her gait and tremor. She is currently on Sinemet 25/100 3 times a day. She tolerates it well though feels her constipation worsened on it.   Currently, she uses a walker and can walk at least 100 feet without rest.     She notes her hands are numb and her handwriting is worse.  The numbness is in her fingertips.      She has joint pain in both hands.      Cognition has been stable. The daughter needs to help her with bills but she also has poor eyesight and poor handwriting.   She has had mild forgetfulness at times  She has some urinary urgency and rare incontinence as she enters the bathroom.    She has insomnia many nights, helped by trazodone.  She has carotid arterial stenosis, with with 60-70% stenosis on the right and 70% on the left.  She also has about 50%  bilateral subclavian stenosis.  REVIEW OF SYSTEMS: Constitutional: No fevers, chills, sweats, or change in appetite.  She notes fatigue and is sometimes sleepy. Eyes: Poor vision due to macular degeneration. Ear, nose and throat: No hearing loss, ear pain, nasal congestion, sore throat Cardiovascular: No chest pain, palpitations.   She has carotid artery stenosis. Respiratory: No shortness of breath at rest or with exertion.   No wheezes GastrointestinaI: No nausea, vomiting, diarrhea, abdominal pain, fecal incontinence.  She has  noticed some difficulty swallowing. She has occasional constipation. Genitourinary: No dysuria, urinary retention or frequency.  No nocturia. Musculoskeletal: No neck pain, back pain.  Has some hip tenderness and had hip surgery in 2009. Integumentary: No rash.  Notes some moles and itching. Neurological: as above Psychiatric: No depression at this time.  No anxiety Endocrine: No palpitations, diaphoresis, change in appetite, change in weigh or increased thirst Hematologic/Lymphatic: No anemia, purpura, petechiae. Allergic/Immunologic: No itchy/runny eyes, rashes.  Sometimes has runny nose.  ALLERGIES: No Known Allergies  HOME MEDICATIONS:  Current Outpatient Medications:  .  acetaminophen (TYLENOL) 325 MG tablet, Take 650 mg by mouth every 6 (six) hours as needed for moderate pain., Disp: , Rfl:  .  carbidopa-levodopa (SINEMET IR) 25-100 MG tablet, TAKE 1  TABLET BY MOUTH EVERY 4 HOURS (6/DAY), Disp: 180 tablet, Rfl: 11 .  divalproex (DEPAKOTE SPRINKLE) 125 MG capsule, Take 125 mg by mouth 2 (two) times daily., Disp: , Rfl:  .  docusate sodium (COLACE) 100 MG capsule, Take 100 mg by mouth at bedtime., Disp: , Rfl:  .  ezetimibe-simvastatin (VYTORIN) 10-40 MG tablet, Take 1 tablet by mouth daily. NEED OV., Disp: 15 tablet, Rfl: 0 .  ipratropium (ATROVENT) 0.03 % nasal spray, Place 2 sprays into both nostrils 3 (three) times daily as needed. , Disp: , Rfl:   .  lactulose (CEPHULAC) 10 g packet, Take 10 g by mouth 3 (three) times daily., Disp: , Rfl:  .  LORazepam (ATIVAN) 0.5 MG tablet, Take 0.25 mg by mouth every 6 (six) hours as needed for anxiety., Disp: , Rfl:  .  LUMIGAN 0.01 % SOLN, Place 1 drop into both eyes at bedtime., Disp: , Rfl:  .  Multiple Vitamin (MULTIVITAMIN WITH MINERALS) TABS tablet, Take 1 tablet by mouth daily., Disp: , Rfl:  .  naproxen sodium (ALEVE) 220 MG tablet, Take 220 mg by mouth., Disp: , Rfl:  .  Nutritional Supplements (NUTRITIONAL DRINK) LIQD, Take by mouth. Ensure twice daily, Disp: , Rfl:  .  ondansetron (ZOFRAN) 8 MG tablet, Take 8 mg by mouth every 8 (eight) hours as needed for nausea or vomiting., Disp: , Rfl:  .  OVER THE COUNTER MEDICATION, Mega Red Krill Oil 350mg  qd, Disp: , Rfl:  .  OVER THE COUNTER MEDICATION, AREDS 2 bid, Disp: , Rfl:  .  polyethylene glycol (MIRALAX / GLYCOLAX) packet, Take 17 g by mouth 2 (two) times daily. (Patient taking differently: Take 17 g by mouth daily as needed. ), Disp: 14 each, Rfl: 0 .  Probiotic Product ([place]) CAPS, Take by mouth., Disp: , Rfl:  .  rasagiline (AZILECT) 0.5 MG TABS tablet, Take 1 tablet (0.5 mg total) by mouth daily., Disp: 30 tablet, Rfl: 11 .  senna-docusate (SENOKOT-S) 8.6-50 MG tablet, Take 1 tablet by mouth daily., Disp: , Rfl:  .  timolol (TIMOPTIC) 0.5 % ophthalmic solution, INSTILL 1 DROP INTO EACH EYE IN THE MORNING, Disp: , Rfl:  .  traZODone (DESYREL) 50 MG tablet, TAKE 1 AND 1/2 TABLET (75 MG TOTAL) BY MOUTH AT BEDTIME AS NEEDED FOR SLEEP, Disp: 135 tablet, Rfl: 4 .  valsartan (DIOVAN) 160 MG tablet, Take 1 tablet by mouth daily., Disp: , Rfl:   PAST MEDICAL HISTORY: Past Medical History:  Diagnosis Date  . Asymptomatic carotid artery stenosis    Dopplers January 2016: RICA 50-69% tortuous (no change; L ICA ~70%, tortuous (no change), bilateral subclavian arteries <50%  . Bladder cancer ([place])   .  Cervical cancer ([place])   .  Coronary artery disease   . Dyslipidemia    Managed by PCP. On statin  . Glaucoma   . Hypertension   . Hypertension, essential, benign   . Moderate Right-sided carotid artery disease    50-70% stenosis on the right carotid artery from 2011. Also noted less than 50% right and left subclavian artery stenosis.  . Parkinson's disease ([place])     PAST SURGICAL HISTORY: Past Surgical History:  Procedure Laterality Date  . ABDOMINAL HYSTERECTOMY    . BLADDER TUMOR EXCISION    . Carotid Dopplers  July 2011   50-70% stenosis of the right carotid with <50% stenosis of both the right and left subclavian arteries.  . CHOLECYSTECTOMY    . NM MYOVIEW LTD  August 2007   No ischemia or infarction  . TRANSTHORACIC ECHOCARDIOGRAM  August 7   Normal EF, impaired relaxation with mildly sclerotic aortic valve but no stenosis. Mild left atrial dilation.    FAMILY HISTORY: Family History  Problem Relation Age of Onset  . Dementia Mother   . Lung cancer Father     SOCIAL HISTORY:  Social History   Socioeconomic History  . Marital status: Widowed    Spouse name: Not on file  . Number of children: Not on file  . Years of education: Not on file  . Highest education level: Not on file  Occupational History  . Not on file  Tobacco Use  . Smoking status: Former Smoker    Quit date: 03/08/1978    Years since quitting: 41.3  . Smokeless tobacco: Never Used  Substance and Sexual Activity  . Alcohol use: Yes    Comment: occas  . Drug use: No  . Sexual activity: Not on file  Other Topics Concern  . Not on file  Social History Narrative   Widowed mother of one, grandmother of one. She quit smoking in 1980. She has a rare alcohol beverage from time to time. She currently lives at [place] on [address].   She is able to get around there without to much difficulty.   During previous visit, she voiced the desire for DO NOT RESUSCITATE and DO NOT INTUBATE. She also is with the  desire to avoid any invasive procedures.    Social Determinants of Health   Financial Resource Strain:   . Difficulty of Paying Living Expenses:   Food Insecurity:   . Worried About Charity fundraiser in the Last Year:   . Arboriculturist in the Last Year:   Transportation Needs:   . Film/video editor (Medical):   Marland Kitchen Lack of Transportation (Non-Medical):   Physical Activity:   . Days of Exercise per Week:   . Minutes of Exercise per Session:   Stress:   . Feeling of Stress :   Social Connections:   . Frequency of Communication with Friends and Family:   . Frequency of Social Gatherings with Friends and Family:   . Attends Religious Services:   . Active Member of Clubs or Organizations:   . Attends Archivist Meetings:   Marland Kitchen Marital Status:   Intimate Partner Violence:   . Fear of Current or Ex-Partner:   . Emotionally Abused:   Marland Kitchen Physically Abused:   . Sexually Abused:        Richard A. Felecia Shelling, MD, PhD [id number], [id number] PM Certified in Neurology, Clinical Neurophysiology, Sleep Medicine, Pain Medicine and Neuroimaging  [facility]  [address] [place], [place]

## 2019-07-09 DIAGNOSIS — M1990 Unspecified osteoarthritis, unspecified site: Secondary | ICD-10-CM | POA: Diagnosis not present

## 2019-07-09 DIAGNOSIS — Z87891 Personal history of nicotine dependence: Secondary | ICD-10-CM | POA: Diagnosis not present

## 2019-07-09 DIAGNOSIS — Z833 Family history of diabetes mellitus: Secondary | ICD-10-CM | POA: Diagnosis not present

## 2019-07-09 DIAGNOSIS — R32 Unspecified urinary incontinence: Secondary | ICD-10-CM | POA: Diagnosis not present

## 2019-07-09 DIAGNOSIS — G20 Parkinson's disease: Secondary | ICD-10-CM | POA: Diagnosis not present

## 2019-07-09 DIAGNOSIS — K5900 Constipation, unspecified: Secondary | ICD-10-CM | POA: Diagnosis not present

## 2019-07-09 DIAGNOSIS — H409 Unspecified glaucoma: Secondary | ICD-10-CM | POA: Diagnosis not present

## 2019-07-09 DIAGNOSIS — F419 Anxiety disorder, unspecified: Secondary | ICD-10-CM | POA: Diagnosis not present

## 2019-07-09 DIAGNOSIS — I10 Essential (primary) hypertension: Secondary | ICD-10-CM | POA: Diagnosis not present

## 2019-07-09 DIAGNOSIS — I739 Peripheral vascular disease, unspecified: Secondary | ICD-10-CM | POA: Diagnosis not present

## 2019-07-09 DIAGNOSIS — H3530 Unspecified macular degeneration: Secondary | ICD-10-CM | POA: Diagnosis not present

## 2019-07-09 DIAGNOSIS — Z8551 Personal history of malignant neoplasm of bladder: Secondary | ICD-10-CM | POA: Diagnosis not present

## 2019-08-24 ENCOUNTER — Telehealth: Payer: Self-pay | Admitting: Neurology

## 2019-08-24 NOTE — Telephone Encounter (Signed)
Walker,Stacey(daughter on DPR) has called asking for a call back to discuss an increase in pt's medication -rasagiline (AZILECT) 0.5 MG TABS tablet please call

## 2019-08-27 MED ORDER — RASAGILINE MESYLATE 1 MG PO TABS: 1.0000 mg | ORAL_TABLET | Freq: Every day | ORAL | 5 refills | Status: DC

## 2019-08-27 NOTE — Telephone Encounter (Signed)
I returned the call to the patient's daughter. Her daughter states she has been taking rasagiline 0.5mg  BID but she is running short on the prescription. An increase was discussed at her last visit, if needed. Per vo by Dr. Felecia Shelling, okay to increase rasagiline 1mg , one tab per day. New rx sent to the pharmacy. Her daughter is aware of the new dosage.

## 2019-08-27 NOTE — Addendum Note (Signed)
Addended by: Desmond Lope on: 08/27/2019 04:41 PM   Modules accepted: Orders

## 2019-09-05 ENCOUNTER — Ambulatory Visit: Payer: Self-pay | Admitting: Neurology

## 2019-12-20 DIAGNOSIS — R634 Abnormal weight loss: Secondary | ICD-10-CM | POA: Diagnosis not present

## 2019-12-20 DIAGNOSIS — R1310 Dysphagia, unspecified: Secondary | ICD-10-CM | POA: Diagnosis not present

## 2019-12-20 DIAGNOSIS — Z0000 Encounter for general adult medical examination without abnormal findings: Secondary | ICD-10-CM | POA: Diagnosis not present

## 2019-12-20 DIAGNOSIS — R739 Hyperglycemia, unspecified: Secondary | ICD-10-CM | POA: Diagnosis not present

## 2019-12-20 DIAGNOSIS — M859 Disorder of bone density and structure, unspecified: Secondary | ICD-10-CM | POA: Diagnosis not present

## 2019-12-20 DIAGNOSIS — Z23 Encounter for immunization: Secondary | ICD-10-CM | POA: Diagnosis not present

## 2019-12-20 DIAGNOSIS — H6121 Impacted cerumen, right ear: Secondary | ICD-10-CM | POA: Diagnosis not present

## 2019-12-20 DIAGNOSIS — M810 Age-related osteoporosis without current pathological fracture: Secondary | ICD-10-CM | POA: Diagnosis not present

## 2019-12-20 DIAGNOSIS — I10 Essential (primary) hypertension: Secondary | ICD-10-CM | POA: Diagnosis not present

## 2019-12-20 DIAGNOSIS — H9190 Unspecified hearing loss, unspecified ear: Secondary | ICD-10-CM | POA: Diagnosis not present

## 2019-12-20 DIAGNOSIS — E785 Hyperlipidemia, unspecified: Secondary | ICD-10-CM | POA: Diagnosis not present

## 2020-01-18 ENCOUNTER — Ambulatory Visit: Payer: Medicare PPO | Attending: Internal Medicine

## 2020-01-18 DIAGNOSIS — Z23 Encounter for immunization: Secondary | ICD-10-CM

## 2020-01-18 NOTE — Progress Notes (Signed)
   Covid-19 Vaccination Clinic  Name:  Stacey Walker    MRN: [id number] DOB: 1925-10-20  01/18/2020  Stacey Walker was observed post Covid-19 immunization for 15 minutes without incident. She was provided with Vaccine Information Sheet and instruction to access the V-Safe system.   Stacey Walker was instructed to call 911 with any severe reactions post vaccine: Marland Kitchen Difficulty breathing  . Swelling of face and throat  . A fast heartbeat  . A bad rash all over body  . Dizziness and weakness

## 2020-02-17 ENCOUNTER — Other Ambulatory Visit: Payer: Self-pay | Admitting: Neurology

## 2020-02-19 DIAGNOSIS — H401131 Primary open-angle glaucoma, bilateral, mild stage: Secondary | ICD-10-CM | POA: Diagnosis not present

## 2020-02-19 DIAGNOSIS — H5211 Myopia, right eye: Secondary | ICD-10-CM | POA: Diagnosis not present

## 2020-02-19 DIAGNOSIS — H5202 Hypermetropia, left eye: Secondary | ICD-10-CM | POA: Diagnosis not present

## 2020-03-03 ENCOUNTER — Telehealth: Payer: Self-pay | Admitting: Neurology

## 2020-03-03 NOTE — Telephone Encounter (Signed)
Called daughter back. Pt now has [employer]. This card is not on file. She will drop off a copy of the front/back of insurance card for [place] to complete PA.

## 2020-03-03 NOTE — Telephone Encounter (Signed)
Pt's daughter Gretel Acre on [place] called needing to speak to someone about a letter that the pt received from the insurance company regarding a PA for the pt's rasagiline (AZILECT) 1 MG TABS tablet Please advise.

## 2020-03-06 NOTE — Telephone Encounter (Addendum)
[employer] denied coverage stating the patient must have tried and failed selegiline. This will be discussed with Dr. Epimenio Foot to see if this is an appropriate option for the patient.

## 2020-03-06 NOTE — Telephone Encounter (Signed)
I called Stacey Walker. The patient just received her medication on 02/23/20. They were able to provide me with the her [employer] ID number ([id number]). PA for rasagiline 1mg  started on covermymeds (key ). : [id number]. Decision pending.

## 2020-03-09 NOTE — Telephone Encounter (Signed)
She has 2 options:  She can continue on rasagiline and use good Rx.  The prescriptions are about $55 at [facility], [facility] and [facility] Or 2.   We can send in selegiline 5 mg #60 5 refill 1/2 po bid x 3 weeks.    If well tolerated she can increase to 1 pill twice a day  Do not take both selegiline and rasagiline at the same time

## 2020-03-10 MED ORDER — SELEGILINE HCL 5 MG PO TABS: ORAL_TABLET | ORAL | 3 refills | Status: DC

## 2020-03-10 NOTE — Addendum Note (Signed)
Addended by: Arther Abbott on: 03/10/2020 09:20 AM   Modules accepted: Orders

## 2020-03-10 NOTE — Telephone Encounter (Signed)
Called and spoke with daughter. They would like to switch to preferred: selegiline. She will stop rasagiline and start selegiline. They will call back in a few weeks if she tolerates ok. I e-scribed rx.

## 2020-06-24 ENCOUNTER — Other Ambulatory Visit: Payer: Self-pay | Admitting: *Deleted

## 2020-06-24 ENCOUNTER — Telehealth: Payer: Self-pay | Admitting: Neurology

## 2020-06-24 MED ORDER — CARBIDOPA-LEVODOPA 25-100 MG PO TABS: ORAL_TABLET | ORAL | 1 refills | Status: DC

## 2020-06-24 NOTE — Telephone Encounter (Signed)
E-scribed refills as requested.

## 2020-06-24 NOTE — Telephone Encounter (Signed)
Pt was resch and needs refills of Sinemet. Best call back is daughter [phone]

## 2020-06-25 ENCOUNTER — Ambulatory Visit: Payer: Medicare PPO | Admitting: Neurology

## 2020-08-10 ENCOUNTER — Other Ambulatory Visit: Payer: Self-pay | Admitting: Neurology

## 2020-08-11 NOTE — Telephone Encounter (Signed)
Spoke with daughter [name] (on [place]). Pt is taking 1 tablet BID. She has a f/u on Wed 6/8 but will run out of medication that same day. Sent a 30 day supply to pharmacy.

## 2020-08-13 ENCOUNTER — Ambulatory Visit: Payer: Medicare PPO | Admitting: Neurology

## 2020-08-13 ENCOUNTER — Encounter: Payer: Self-pay | Admitting: Neurology

## 2020-08-13 VITALS — BP 179/89 | HR 71 | Ht 62.0 in | Wt 93.5 lb

## 2020-08-13 DIAGNOSIS — N3941 Urge incontinence: Secondary | ICD-10-CM | POA: Diagnosis not present

## 2020-08-13 DIAGNOSIS — R531 Weakness: Secondary | ICD-10-CM | POA: Diagnosis not present

## 2020-08-13 DIAGNOSIS — R269 Unspecified abnormalities of gait and mobility: Secondary | ICD-10-CM

## 2020-08-13 DIAGNOSIS — G20 Parkinson's disease: Secondary | ICD-10-CM | POA: Diagnosis not present

## 2020-08-13 DIAGNOSIS — K5909 Other constipation: Secondary | ICD-10-CM | POA: Diagnosis not present

## 2020-08-13 MED ORDER — HYDROXYZINE HCL 10 MG PO TABS: ORAL_TABLET | ORAL | 5 refills | Status: DC

## 2020-08-13 MED ORDER — SELEGILINE HCL 5 MG PO TABS: 5.0000 mg | ORAL_TABLET | Freq: Two times a day (BID) | ORAL | 11 refills | Status: DC

## 2020-08-13 NOTE — Progress Notes (Signed)
[facility]  PATIENT: Stacey Walker DOB: May 05, 1925  REFERRING DOCTOR OR PCP:  Leanna Battles SOURCE: patient, daughter, EMR records, CT images in PACS  _________________________________   HISTORICAL  CHIEF COMPLAINT:  Chief Complaint  Patient presents with  . Follow-up    RM 13 w/ caregiver and daughter. Last seen 07/04/2019. Follow up for PD. Takes sinemet, azilect.   HISTORY OF PRESENT ILLNESS:  [name] is an 87 year old right-handed woman with Parkinson's disease   Update 08/13/2020: She has declined further.   She is only able to take a few steps   PT had not helped. She needs assistance with transfers.    She takes Sinemet one po q4 hours.   She has a tremor.   No dyskinesia.    She was using her walker better in early 2021 for short distances.    She living with her daughter.     Assisted living was not a good experience for her and agitation and medical issues worsened.   Palliative care is also involved.  She has no skin breakdoen  She eats poorly some days and the daughter needs to push food and drink.     She has lost 20 pounds over the past 2 years and has a poor appetite and constipation.    She only has some Ensure and a few small snacks like fruit daily. Bladder is less of a problem than constipation.  .   She has had only rare incontinence.   She is on Pericolace, senna and Miralax.   She rarely takes lactulose.   She gets nausea and occasionally takes Zofran.    She sleeps well at night but also takes naps.  She sleeps 16 hours many days.    She has mild cognitive issues but STM is only mildly reduced.    No hallucinations but some delusions (thinks there is a huge emerald under her chair).  She is on Depakote for agitation    She has carotid arterial stenosis, with with 60-70% stenosis on the right and 70% on the left.  She also has about 50% bilateral subclavian stenosis.  REVIEW OF SYSTEMS: Constitutional: No fevers, chills, sweats, or  change in appetite.  She notes fatigue and is sometimes sleepy. Eyes: Poor vision due to macular degeneration. Ear, nose and throat: No hearing loss, ear pain, nasal congestion, sore throat Cardiovascular: No chest pain, palpitations.   She has carotid artery stenosis. Respiratory: No shortness of breath at rest or with exertion.   No wheezes GastrointestinaI: No nausea, vomiting, diarrhea, abdominal pain, fecal incontinence.  She has noticed some difficulty swallowing. She has occasional constipation. Genitourinary: No dysuria, urinary retention or frequency.  No nocturia. Musculoskeletal: No neck pain, back pain.  Has some hip tenderness and had hip surgery in 2009. Integumentary: No rash.  Notes some moles and itching. Neurological: as above Psychiatric: No depression at this time.  No anxiety Endocrine: No palpitations, diaphoresis, change in appetite, change in weigh or increased thirst Hematologic/Lymphatic: No anemia, purpura, petechiae. Allergic/Immunologic: No itchy/runny eyes, rashes.  Sometimes has runny nose.  ALLERGIES: No Known Allergies  HOME MEDICATIONS:  Current Outpatient Medications:  .  acetaminophen (TYLENOL) 325 MG tablet, Take 650 mg by mouth every 6 (six) hours as needed for moderate pain., Disp: , Rfl:  .  carbidopa-levodopa (SINEMET IR) 25-100 MG tablet, TAKE 1  TABLET BY MOUTH EVERY 4 HOURS (6/DAY), Disp: 180 tablet, Rfl: 1 .  divalproex (DEPAKOTE SPRINKLE) 125 MG capsule,  Take 125 mg by mouth 2 (two) times daily., Disp: , Rfl:  .  ipratropium (ATROVENT) 0.03 % nasal spray, Place 2 sprays into both nostrils 3 (three) times daily as needed. , Disp: , Rfl:  .  lactulose (CEPHULAC) 10 g packet, Take 10 g by mouth 3 (three) times daily., Disp: , Rfl:  .  LORazepam (ATIVAN) 0.5 MG tablet, Take 0.25 mg by mouth every 6 (six) hours as needed for anxiety., Disp: , Rfl:  .  LUMIGAN 0.01 % SOLN, Place 1 drop into both eyes at bedtime., Disp: , Rfl:  .  naproxen sodium  (ALEVE) 220 MG tablet, Take 220 mg by mouth., Disp: , Rfl:  .  Nutritional Supplements (NUTRITIONAL DRINK) LIQD, Take by mouth. Ensure twice daily, Disp: , Rfl:  .  ondansetron (ZOFRAN) 8 MG tablet, Take 8 mg by mouth every 8 (eight) hours as needed for nausea or vomiting., Disp: , Rfl:  .  OVER THE COUNTER MEDICATION, AREDS 2 bid, Disp: , Rfl:  .  polyethylene glycol (MIRALAX / GLYCOLAX) packet, Take 17 g by mouth 2 (two) times daily. (Patient taking differently: Take 17 g by mouth daily as needed. ), Disp: 14 each, Rfl: 0 .  selegiline (ELDEPRYL) 5 MG tablet, Take 1 tablet (5 mg total) by mouth in the morning and at bedtime., Disp: 60 tablet, Rfl: 0 .  timolol (TIMOPTIC) 0.5 % ophthalmic solution, INSTILL 1 DROP INTO EACH EYE IN THE MORNING, Disp: , Rfl:  .  valsartan (DIOVAN) 160 MG tablet, Take 1 tablet by mouth daily., Disp: , Rfl:   PAST MEDICAL HISTORY: Past Medical History:  Diagnosis Date  . Asymptomatic carotid artery stenosis    Dopplers January 2016: RICA 50-69% tortuous (no change; L ICA ~70%, tortuous (no change), bilateral subclavian arteries <50%  . Bladder cancer ([place])   . Cervical cancer ([place])   . Coronary artery disease   . Dyslipidemia    Managed by PCP. On statin  . Glaucoma   . Hypertension   . Hypertension, essential, benign   . Moderate Right-sided carotid artery disease    50-70% stenosis on the right carotid artery from 2011. Also noted less than 50% right and left subclavian artery stenosis.  . Parkinson's disease ([place])     PAST SURGICAL HISTORY: Past Surgical History:  Procedure Laterality Date  . ABDOMINAL HYSTERECTOMY    . BLADDER TUMOR EXCISION    . Carotid Dopplers  July 2011   50-70% stenosis of the right carotid with <50% stenosis of both the right and left subclavian arteries.  . CHOLECYSTECTOMY    . NM MYOVIEW LTD  August 2007   No ischemia or infarction  . TRANSTHORACIC ECHOCARDIOGRAM  August 7   Normal EF, impaired relaxation with mildly  sclerotic aortic valve but no stenosis. Mild left atrial dilation.    FAMILY HISTORY: Family History  Problem Relation Age of Onset  . Dementia Mother   . Lung cancer Father     SOCIAL HISTORY:  Social History   Socioeconomic History  . Marital status: Widowed    Spouse name: Not on file  . Number of children: Not on file  . Years of education: Not on file  . Highest education level: Not on file  Occupational History  . Not on file  Tobacco Use  . Smoking status: Former Smoker    Quit date: 03/08/1978    Years since quitting: 42.4  . Smokeless tobacco: Never Used  Substance and Sexual Activity  .  Alcohol use: Yes    Comment: occas  . Drug use: No  . Sexual activity: Not on file  Other Topics Concern  . Not on file  Social History Narrative   Widowed mother of one, grandmother of one. She quit smoking in 1980. She has a rare alcohol beverage from time to time. She currently lives at [place] on [address].   She is able to get around there without to much difficulty.   During previous visit, she voiced the desire for DO NOT RESUSCITATE and DO NOT INTUBATE. She also is with the desire to avoid any invasive procedures.    Social Determinants of Health   Financial Resource Strain: Not on file  Food Insecurity: Not on file  Transportation Needs: Not on file  Physical Activity: Not on file  Stress: Not on file  Social Connections: Not on file  Intimate Partner Violence: Not on file   ___________________________________ PHYSICAL EXAMINATION   Today's Vitals   08/13/20 1333  BP: (!) 179/89  Pulse: 71  SpO2: 96%  Weight: 93 lb 8 oz (42.4 kg)  Height: 5\' 2"  (1.575 m)   Body mass index is 17.1 kg/m.  General: The patient is well-developed and well-nourished and in no acute distress   Neurologic Exam  Mental status: The patient is alert and oriented x 3 at the time of the examination.     Speech is normal.  Cranial nerves: Extraocular  movements are full.  Facial symmetry is present.  Facial strength is normal.  Trapezius and sternocleidomastoid strength is normal. No dysarthria is noted.    No obvious hearing deficits are noted.  Motor:  Muscle bulk is normal.   Tone is normal. There is no cogwheeling. Strength is  5 / 5 in the arms except 4+/5 in the APB muscles and 4+/5 in the proximal legs.  Sensory: She notes decreased sensation over the thenar eminence and adjacent palm in both hands and notes mild allodynia there.    Gait and station:  She needs bilateral support to stand.  She supports most of her weight while walking but needs support.  The stride is moderately reduced.    Reflexes: Deep tendon reflexes are symmetric and 1+ bilaterally  knees.   _____________________________________________________  Assessment and Plan: No diagnosis found.  1.   Sinemet 25/100 every 4 hours up to 6 pills a day. 2 .   Continue selegilene 3.   Transfer with assistance 4.   Hydroxyzine prn agitation or insomnia 5.   Return in 6 months or sooner if there are new or worsening neurologic symptoms.    Christabel Camire A. Felecia Shelling, MD, PhD 10/13/5025, 7:41 PM Certified in Neurology, Clinical Neurophysiology, Sleep Medicine, Pain Medicine and Neuroimaging  [facility] [address] [place], [place]

## 2020-08-24 ENCOUNTER — Other Ambulatory Visit: Payer: Self-pay | Admitting: Neurology

## 2020-10-06 ENCOUNTER — Ambulatory Visit: Payer: Medicare PPO | Attending: Internal Medicine

## 2020-10-06 ENCOUNTER — Other Ambulatory Visit (HOSPITAL_BASED_OUTPATIENT_CLINIC_OR_DEPARTMENT_OTHER): Payer: Self-pay

## 2020-10-06 ENCOUNTER — Other Ambulatory Visit: Payer: Self-pay

## 2020-10-06 DIAGNOSIS — Z23 Encounter for immunization: Secondary | ICD-10-CM

## 2020-10-06 MED ORDER — COVID-19 MRNA VACC (MODERNA) 100 MCG/0.5ML IM SUSP
INTRAMUSCULAR | 0 refills | Status: DC
  Filled 2020-10-06: qty 0.25, 1d supply, fill #0

## 2020-10-06 NOTE — Progress Notes (Signed)
   Covid-19 Vaccination Clinic  Name:  Stacey Walker    MRN: [id number] DOB: 1925-05-02  10/06/2020  Ms. Stacey Walker was observed post Covid-19 immunization for 15 minutes without incident. She was provided with Vaccine Information Sheet and instruction to access the V-Safe system.   Ms. Stacey Walker was instructed to call 911 with any severe reactions post vaccine: Difficulty breathing  Swelling of face and throat  A fast heartbeat  A bad rash all over body  Dizziness and weakness   Immunizations Administered     Name Date Dose VIS Date Route   Moderna Covid-19 Booster Vaccine 10/06/2020 10:05 AM 0.25 mL 12/26/2019 Intramuscular   Manufacturer: Moderna   Lot: [id number]   [id number]

## 2020-10-22 DIAGNOSIS — B029 Zoster without complications: Secondary | ICD-10-CM | POA: Diagnosis not present

## 2020-10-23 ENCOUNTER — Telehealth: Payer: Self-pay | Admitting: Neurology

## 2020-10-23 NOTE — Telephone Encounter (Signed)
Pt's daughter [name] called wanting to know what is a cough medicine that her mother can take with her Parkinsonism, or is there something the doctor can prescribe for her mother. [name] is requesting a call back on her cell or can be reached on her work number at [phone] she will be there until about 3:20 today.

## 2020-10-23 NOTE — Telephone Encounter (Signed)
Called and spoke w/ daughter. Advised I spoke with Dr. Felecia Shelling who states from a neurological standpoint, she can take cough medicidine. She should reach out to her PCP for  further recommendation on specific cough med she should take.  Daughter states she has had a wet coughx3 weeks, clear drainage/post nasal drip. Currently has shingles. Rash showded up yesterday on face. Placed on valtrex for treatment. Pt does not want any aggressive treatment per daughter. She will contact PCP.

## 2020-10-30 ENCOUNTER — Other Ambulatory Visit: Payer: Self-pay | Admitting: Neurology

## 2020-11-13 ENCOUNTER — Telehealth: Payer: Self-pay | Admitting: Neurology

## 2020-11-13 NOTE — Telephone Encounter (Signed)
Called daughter. Advised Dr. Felecia Shelling approved therapy. She wants to do home health PT. Aware we will have to send for two services (PT/nursing) w/ referral. She is going to do some research on who she wants [place] to send referral to and will call back.

## 2020-11-13 NOTE — Telephone Encounter (Signed)
Pt's daughter states pt informed her she would like try try PT again and this time is willing to cooperate.  Daughter is asking for a call re: what Dr Felecia Shelling says about this .

## 2020-12-25 DIAGNOSIS — E440 Moderate protein-calorie malnutrition: Secondary | ICD-10-CM | POA: Diagnosis not present

## 2020-12-25 DIAGNOSIS — R918 Other nonspecific abnormal finding of lung field: Secondary | ICD-10-CM | POA: Diagnosis not present

## 2020-12-25 DIAGNOSIS — R739 Hyperglycemia, unspecified: Secondary | ICD-10-CM | POA: Diagnosis not present

## 2020-12-25 DIAGNOSIS — Z23 Encounter for immunization: Secondary | ICD-10-CM | POA: Diagnosis not present

## 2020-12-25 DIAGNOSIS — G20 Parkinson's disease: Secondary | ICD-10-CM | POA: Diagnosis not present

## 2021-01-05 DIAGNOSIS — E785 Hyperlipidemia, unspecified: Secondary | ICD-10-CM | POA: Diagnosis not present

## 2021-01-05 DIAGNOSIS — I10 Essential (primary) hypertension: Secondary | ICD-10-CM | POA: Diagnosis not present

## 2021-01-05 DIAGNOSIS — M8580 Other specified disorders of bone density and structure, unspecified site: Secondary | ICD-10-CM | POA: Diagnosis not present

## 2021-01-16 ENCOUNTER — Ambulatory Visit: Payer: Medicare PPO

## 2021-01-25 ENCOUNTER — Other Ambulatory Visit: Payer: Self-pay | Admitting: Neurology

## 2021-01-26 ENCOUNTER — Other Ambulatory Visit: Payer: Self-pay | Admitting: Neurology

## 2021-01-27 ENCOUNTER — Other Ambulatory Visit: Payer: Self-pay | Admitting: Neurology

## 2021-01-27 MED ORDER — CARBIDOPA-LEVODOPA 25-100 MG PO TABS: 1.0000 | ORAL_TABLET | ORAL | 2 refills | Status: DC

## 2021-02-09 ENCOUNTER — Telehealth: Payer: Self-pay | Admitting: Neurology

## 2021-02-09 ENCOUNTER — Other Ambulatory Visit (HOSPITAL_COMMUNITY): Payer: Self-pay

## 2021-02-09 MED ORDER — SERTRALINE HCL 25 MG PO TABS
25.0000 mg | ORAL_TABLET | Freq: Every evening | ORAL | 5 refills | Status: DC
Start: 2021-02-09 — End: 2023-06-03

## 2021-02-09 NOTE — Addendum Note (Signed)
Addended by: Darleen Crocker on: 02/09/2021 05:12 PM   Modules accepted: Orders

## 2021-02-09 NOTE — Telephone Encounter (Signed)
Pt's daughter [name] called asking of doctor could prescribe a depression and agitation medication. [name] thinks the meds she is on is causing interaction. [name] is requesting a call back.

## 2021-02-09 NOTE — Telephone Encounter (Signed)
Called the daughter back and advised that Dr Felecia Shelling would recommend a low dose sertaline 25 mg daily

## 2021-02-09 NOTE — Telephone Encounter (Signed)
She reports her mother is having agitation/depression/anxiety/panic attacks/restlessness. Always has some mild depression in the Winter but worse the last couple weeks. Already on medications but not helping (Takes lorazepam/hydroxyzine prn) She has already called PCP. They recommended Zoloft or wellbutrin but pharmacy recommending against it d/t her being on sinemet. Wondering what Dr. Felecia Shelling would recommend. Aware we will send to MD for review and call back w/ his recommendation.

## 2021-03-11 ENCOUNTER — Ambulatory Visit: Payer: Medicare PPO | Attending: Internal Medicine

## 2021-03-11 ENCOUNTER — Other Ambulatory Visit (HOSPITAL_BASED_OUTPATIENT_CLINIC_OR_DEPARTMENT_OTHER): Payer: Self-pay

## 2021-03-11 DIAGNOSIS — Z23 Encounter for immunization: Secondary | ICD-10-CM

## 2021-03-11 MED ORDER — MODERNA COVID-19 BIVAL BOOSTER 50 MCG/0.5ML IM SUSP
INTRAMUSCULAR | 0 refills | Status: DC
  Filled 2021-03-11: qty 0.5, 1d supply, fill #0

## 2021-03-11 NOTE — Progress Notes (Signed)
° °  Covid-19 Vaccination Clinic  Name:  CEAZIA HARB    MRN: [id number] DOB: 10-29-1925  03/11/2021  Ms. Mundorf was observed post Covid-19 immunization for 15 minutes without incident. She was provided with Vaccine Information Sheet and instruction to access the V-Safe system.   Ms. Grandinetti was instructed to call 911 with any severe reactions post vaccine: Difficulty breathing  Swelling of face and throat  A fast heartbeat  A bad rash all over body  Dizziness and weakness   Immunizations Administered     Name Date Dose VIS Date Route   Moderna Covid-19 vaccine Bivalent Booster 03/11/2021 10:16 AM 0.5 mL 10/18/2020 Intramuscular   Manufacturer: Moderna   Lot: [id number]   [place]: [id number]

## 2021-03-16 ENCOUNTER — Ambulatory Visit: Payer: Medicare PPO | Admitting: Podiatry

## 2021-03-16 ENCOUNTER — Other Ambulatory Visit: Payer: Self-pay

## 2021-03-16 DIAGNOSIS — B351 Tinea unguium: Secondary | ICD-10-CM

## 2021-03-16 DIAGNOSIS — M79675 Pain in left toe(s): Secondary | ICD-10-CM | POA: Diagnosis not present

## 2021-03-16 DIAGNOSIS — M79674 Pain in right toe(s): Secondary | ICD-10-CM

## 2021-03-17 DIAGNOSIS — Z85828 Personal history of other malignant neoplasm of skin: Secondary | ICD-10-CM | POA: Diagnosis not present

## 2021-03-17 DIAGNOSIS — B351 Tinea unguium: Secondary | ICD-10-CM | POA: Diagnosis not present

## 2021-03-20 ENCOUNTER — Encounter: Payer: Self-pay | Admitting: Podiatry

## 2021-03-20 NOTE — Progress Notes (Signed)
°  Subjective:  Patient ID: Stacey Walker, female    DOB: 07/20/1925,  MRN: [id number]  Stacey Walker presents to clinic today for painful elongated mycotic toenails 1-5 bilaterally which are tender when wearing enclosed shoe gear. Pain is relieved with periodic professional debridement.  Daughter and caregiver are present on today's visit. They state left 4th toenail is very sore and is wondering if it needs to be removed. Deny any preceding episode of trauma to toe. No drainage or redness, but toe is very sore.  PCP is Donnajean Lopes, MD , and last visit was 12/25/2020.  Allergies  Allergen Reactions   Amlodipine     Other reaction(s): Leg Edema    Review of Systems: Negative except as noted in the HPI. Objective:   Constitutional Stacey Walker is a pleasant 88 y.o. Caucasian female, WD, WN in NAD. AAO x 3.   Vascular CFT <3 seconds b/l LE. Faintly palpable pedal pulses b/l LE. Pedal hair sparse b/l LE. Skin temperature gradient WNL b/l. No pain with calf compression b/l. No edema b/l LE. No cyanosis or clubbing noted b/l LE. Varicosities present b/l.  Neurologic Normal speech. Oriented to person, place, and time. Protective sensation intact 5/5 intact bilaterally with 10g monofilament b/l.  Dermatologic Pedal integument with normal turgor, texture and tone b/l LE. No open wounds b/l. No interdigital macerations b/l. Toenails 1-5 right, L hallux, L 2nd toe, L 3rd toe, and L 5th toe elongated, thickened, discolored with subungual debris. +Tenderness with dorsal palpation of nailplates. No hyperkeratotic or porokeratotic lesions present. Left 4th toenail is moderately elongated extending past the distal tip of the digit; it is  mycotic, with slight amount of onycholysis distally. Pain on palpation. No underlying abscess, no erythema.  Orthopedic: Muscle strength 5/5 to all lower extremity muscle groups bilaterally. No pain, crepitus or joint limitation noted with ROM bilateral LE. Pain on  palpation L 4th toe.   Radiographs: None Assessment:   1. Pain due to onychomycosis of toenails of both feet    Plan:  Patient was evaluated and treated and all questions answered. Consent given for treatment as described below: -Examined patient. -Mycotic toenails 1-5 bilaterally were debrided in length and girth with sterile nail nippers and dremel without incident. Relief of pain symptoms noted post debridement of symptomatic left 4th toenail. -Patient to report any pedal injuries to medical professional immediately. -Patient/POA to call should there be question/concern in the interim.  Return in about 3 months (around 06/14/2021).  Marzetta Board, DPM

## 2021-04-26 ENCOUNTER — Other Ambulatory Visit: Payer: Self-pay | Admitting: Neurology

## 2021-05-25 ENCOUNTER — Other Ambulatory Visit: Payer: Self-pay | Admitting: Neurology

## 2021-06-07 ENCOUNTER — Other Ambulatory Visit: Payer: Self-pay | Admitting: Neurology

## 2021-06-10 ENCOUNTER — Other Ambulatory Visit: Payer: Self-pay | Admitting: Neurology

## 2021-06-12 ENCOUNTER — Telehealth: Payer: Self-pay

## 2021-06-26 ENCOUNTER — Other Ambulatory Visit: Payer: Self-pay | Admitting: Neurology

## 2021-08-05 ENCOUNTER — Telehealth: Payer: Self-pay | Admitting: Neurology

## 2021-08-05 NOTE — Telephone Encounter (Signed)
Placed sympathy card in mail to daughter.

## 2021-08-05 NOTE — Telephone Encounter (Signed)
Pt's daughter, Romie Jumper LVM at 12:17 pm  to notify [facility] pt has passed away on 2021/08/09. Wanted to let y'all know to remove her from your system.

## 2021-08-06 DEATH — deceased

## 2021-09-24 ENCOUNTER — Ambulatory Visit: Payer: Medicare PPO | Admitting: Family Medicine
